# Patient Record
Sex: Female | Born: 1940 | Race: White | Hispanic: No | Marital: Married | Smoking: Never smoker
Health system: Southern US, Community
[De-identification: ages and names within clinical notes are randomized; demographics above are authoritative.]

## PROBLEM LIST (undated history)

## (undated) DIAGNOSIS — G2581 Restless legs syndrome: Secondary | ICD-10-CM

## (undated) DIAGNOSIS — I639 Cerebral infarction, unspecified: Secondary | ICD-10-CM

## (undated) DIAGNOSIS — K635 Polyp of colon: Secondary | ICD-10-CM

## (undated) DIAGNOSIS — R112 Nausea with vomiting, unspecified: Secondary | ICD-10-CM

## (undated) DIAGNOSIS — I671 Cerebral aneurysm, nonruptured: Secondary | ICD-10-CM

## (undated) DIAGNOSIS — H3530 Unspecified macular degeneration: Secondary | ICD-10-CM

## (undated) DIAGNOSIS — T7840XA Allergy, unspecified, initial encounter: Secondary | ICD-10-CM

## (undated) DIAGNOSIS — G5602 Carpal tunnel syndrome, left upper limb: Secondary | ICD-10-CM

## (undated) DIAGNOSIS — E7800 Pure hypercholesterolemia, unspecified: Secondary | ICD-10-CM

## (undated) DIAGNOSIS — Z9889 Other specified postprocedural states: Secondary | ICD-10-CM

## (undated) DIAGNOSIS — G4700 Insomnia, unspecified: Secondary | ICD-10-CM

## (undated) DIAGNOSIS — K5792 Diverticulitis of intestine, part unspecified, without perforation or abscess without bleeding: Secondary | ICD-10-CM

## (undated) DIAGNOSIS — M1990 Unspecified osteoarthritis, unspecified site: Secondary | ICD-10-CM

## (undated) DIAGNOSIS — K219 Gastro-esophageal reflux disease without esophagitis: Secondary | ICD-10-CM

## (undated) DIAGNOSIS — I10 Essential (primary) hypertension: Secondary | ICD-10-CM

## (undated) DIAGNOSIS — Z8489 Family history of other specified conditions: Secondary | ICD-10-CM

## (undated) DIAGNOSIS — I2510 Atherosclerotic heart disease of native coronary artery without angina pectoris: Secondary | ICD-10-CM

## (undated) HISTORY — DX: Insomnia, unspecified: G47.00

## (undated) HISTORY — DX: Allergy, unspecified, initial encounter: T78.40XA

## (undated) HISTORY — DX: Polyp of colon: K63.5

## (undated) HISTORY — PX: KNEE ARTHROSCOPY: SHX127

## (undated) HISTORY — DX: Carpal tunnel syndrome, left upper limb: G56.02

## (undated) HISTORY — DX: Unspecified osteoarthritis, unspecified site: M19.90

## (undated) HISTORY — DX: Diverticulitis of intestine, part unspecified, without perforation or abscess without bleeding: K57.92

## (undated) HISTORY — PX: BREAST SURGERY: SHX581

## (undated) HISTORY — DX: Gastro-esophageal reflux disease without esophagitis: K21.9

## (undated) HISTORY — PX: JOINT REPLACEMENT: SHX530

## (undated) HISTORY — PX: CHOLECYSTECTOMY: SHX55

## (undated) HISTORY — DX: Cerebral aneurysm, nonruptured: I67.1

## (undated) HISTORY — DX: Restless legs syndrome: G25.81

---

## 1998-04-11 ENCOUNTER — Emergency Department (HOSPITAL_COMMUNITY): Admission: EM | Admit: 1998-04-11 | Discharge: 1998-04-11 | Payer: Self-pay | Admitting: Emergency Medicine

## 1998-12-12 HISTORY — PX: VAGINAL HYSTERECTOMY: SUR661

## 1999-12-23 ENCOUNTER — Encounter: Admission: RE | Admit: 1999-12-23 | Discharge: 1999-12-23 | Payer: Self-pay | Admitting: Endocrinology

## 1999-12-23 ENCOUNTER — Encounter: Payer: Self-pay | Admitting: Endocrinology

## 2000-06-21 ENCOUNTER — Ambulatory Visit (HOSPITAL_COMMUNITY): Admission: RE | Admit: 2000-06-21 | Discharge: 2000-06-21 | Payer: Self-pay | Admitting: Gastroenterology

## 2000-12-26 ENCOUNTER — Encounter: Admission: RE | Admit: 2000-12-26 | Discharge: 2000-12-26 | Payer: Self-pay | Admitting: Endocrinology

## 2000-12-26 ENCOUNTER — Encounter: Payer: Self-pay | Admitting: Endocrinology

## 2001-08-03 ENCOUNTER — Encounter: Admission: RE | Admit: 2001-08-03 | Discharge: 2001-08-03 | Payer: Self-pay | Admitting: General Surgery

## 2001-08-03 ENCOUNTER — Encounter: Payer: Self-pay | Admitting: General Surgery

## 2002-09-03 ENCOUNTER — Encounter: Admission: RE | Admit: 2002-09-03 | Discharge: 2002-09-03 | Payer: Self-pay | Admitting: Endocrinology

## 2002-09-03 ENCOUNTER — Encounter: Payer: Self-pay | Admitting: Endocrinology

## 2002-11-05 ENCOUNTER — Encounter: Admission: RE | Admit: 2002-11-05 | Discharge: 2002-11-05 | Payer: Self-pay | Admitting: Internal Medicine

## 2002-11-05 ENCOUNTER — Encounter: Payer: Self-pay | Admitting: Internal Medicine

## 2003-03-04 ENCOUNTER — Ambulatory Visit (HOSPITAL_COMMUNITY): Admission: RE | Admit: 2003-03-04 | Discharge: 2003-03-04 | Payer: Self-pay | Admitting: Neurology

## 2003-03-20 ENCOUNTER — Ambulatory Visit (HOSPITAL_COMMUNITY): Admission: RE | Admit: 2003-03-20 | Discharge: 2003-03-20 | Payer: Self-pay | Admitting: Neurology

## 2003-04-08 ENCOUNTER — Ambulatory Visit (HOSPITAL_COMMUNITY): Admission: RE | Admit: 2003-04-08 | Discharge: 2003-04-08 | Payer: Self-pay | Admitting: Neurology

## 2003-04-19 ENCOUNTER — Encounter: Payer: Self-pay | Admitting: Neurology

## 2003-05-16 ENCOUNTER — Observation Stay (HOSPITAL_COMMUNITY): Admission: AD | Admit: 2003-05-16 | Discharge: 2003-05-17 | Payer: Self-pay | Admitting: Interventional Radiology

## 2003-10-29 ENCOUNTER — Encounter: Admission: RE | Admit: 2003-10-29 | Discharge: 2003-10-29 | Payer: Self-pay | Admitting: Internal Medicine

## 2003-11-18 ENCOUNTER — Ambulatory Visit (HOSPITAL_COMMUNITY): Admission: RE | Admit: 2003-11-18 | Discharge: 2003-11-18 | Payer: Self-pay | Admitting: Interventional Radiology

## 2003-11-25 ENCOUNTER — Encounter: Admission: RE | Admit: 2003-11-25 | Discharge: 2003-11-25 | Payer: Self-pay | Admitting: Obstetrics and Gynecology

## 2003-12-02 ENCOUNTER — Ambulatory Visit (HOSPITAL_COMMUNITY): Admission: RE | Admit: 2003-12-02 | Discharge: 2003-12-02 | Payer: Self-pay | Admitting: Interventional Radiology

## 2004-11-02 ENCOUNTER — Encounter: Admission: RE | Admit: 2004-11-02 | Discharge: 2004-11-02 | Payer: Self-pay | Admitting: Obstetrics and Gynecology

## 2005-04-12 DIAGNOSIS — K635 Polyp of colon: Secondary | ICD-10-CM

## 2005-04-12 HISTORY — DX: Polyp of colon: K63.5

## 2005-11-08 ENCOUNTER — Encounter: Admission: RE | Admit: 2005-11-08 | Discharge: 2005-11-08 | Payer: Self-pay | Admitting: Obstetrics and Gynecology

## 2006-01-03 ENCOUNTER — Encounter: Payer: Self-pay | Admitting: Interventional Radiology

## 2006-01-24 ENCOUNTER — Encounter: Admission: RE | Admit: 2006-01-24 | Discharge: 2006-01-24 | Payer: Self-pay | Admitting: Endocrinology

## 2006-02-08 ENCOUNTER — Ambulatory Visit (HOSPITAL_COMMUNITY): Admission: RE | Admit: 2006-02-08 | Discharge: 2006-02-08 | Payer: Self-pay | Admitting: Interventional Radiology

## 2006-11-14 ENCOUNTER — Encounter: Admission: RE | Admit: 2006-11-14 | Discharge: 2006-11-14 | Payer: Self-pay | Admitting: Endocrinology

## 2007-03-10 ENCOUNTER — Ambulatory Visit (HOSPITAL_COMMUNITY): Admission: RE | Admit: 2007-03-10 | Discharge: 2007-03-10 | Payer: Self-pay | Admitting: Interventional Radiology

## 2007-11-27 ENCOUNTER — Encounter: Admission: RE | Admit: 2007-11-27 | Discharge: 2007-11-27 | Payer: Self-pay | Admitting: Endocrinology

## 2007-12-05 ENCOUNTER — Encounter: Admission: RE | Admit: 2007-12-05 | Discharge: 2007-12-05 | Payer: Self-pay | Admitting: Endocrinology

## 2008-01-18 ENCOUNTER — Encounter: Payer: Self-pay | Admitting: Interventional Radiology

## 2008-03-11 ENCOUNTER — Ambulatory Visit (HOSPITAL_COMMUNITY): Admission: RE | Admit: 2008-03-11 | Discharge: 2008-03-11 | Payer: Self-pay | Admitting: Interventional Radiology

## 2008-11-27 ENCOUNTER — Encounter: Admission: RE | Admit: 2008-11-27 | Discharge: 2008-11-27 | Payer: Self-pay | Admitting: Endocrinology

## 2009-12-01 ENCOUNTER — Encounter: Admission: RE | Admit: 2009-12-01 | Discharge: 2009-12-01 | Payer: Self-pay | Admitting: Endocrinology

## 2010-01-13 ENCOUNTER — Encounter (INDEPENDENT_AMBULATORY_CARE_PROVIDER_SITE_OTHER): Payer: Self-pay | Admitting: Obstetrics and Gynecology

## 2010-01-13 ENCOUNTER — Inpatient Hospital Stay (HOSPITAL_COMMUNITY): Admission: RE | Admit: 2010-01-13 | Discharge: 2010-01-15 | Payer: Self-pay | Admitting: Obstetrics and Gynecology

## 2010-05-02 ENCOUNTER — Encounter: Payer: Self-pay | Admitting: Neurology

## 2010-05-03 ENCOUNTER — Encounter: Payer: Self-pay | Admitting: Endocrinology

## 2010-06-25 LAB — APTT: aPTT: 29 seconds (ref 24–37)

## 2010-06-25 LAB — BASIC METABOLIC PANEL
BUN: 6 mg/dL (ref 6–23)
BUN: 9 mg/dL (ref 6–23)
CO2: 22 mEq/L (ref 19–32)
CO2: 28 mEq/L (ref 19–32)
Calcium: 8.4 mg/dL (ref 8.4–10.5)
Chloride: 109 mEq/L (ref 96–112)
Creatinine, Ser: 0.77 mg/dL (ref 0.4–1.2)
GFR calc Af Amer: >60 mL/min (ref >60)
GFR calc non Af Amer: >60 mL/min (ref >60)
Glucose, Bld: 112 mg/dL — ABNORMAL HIGH (ref 70–99)
Glucose, Bld: 125 mg/dL — ABNORMAL HIGH (ref 70–99)
Sodium: 139 mEq/L (ref 135–145)

## 2010-06-25 LAB — COMPREHENSIVE METABOLIC PANEL
ALT: 16 U/L (ref 0–35)
Albumin: 4.1 g/dL (ref 3.5–5.2)
Chloride: 108 mEq/L (ref 96–112)
Potassium: 3.8 mEq/L (ref 3.5–5.1)

## 2010-06-25 LAB — CBC
MCH: 30.8 pg (ref 26.0–34.0)
MCH: 31 pg (ref 26.0–34.0)
MCHC: 34.2 g/dL (ref 30.0–36.0)
Platelets: 222 10*3/uL (ref 150–400)
RBC: 3.41 MIL/uL — ABNORMAL LOW (ref 3.87–5.11)
RBC: 4.28 MIL/uL (ref 3.87–5.11)
RDW: 12.4 % (ref 11.5–15.5)
WBC: 6.8 10*3/uL (ref 4.0–10.5)
WBC: 5.9 10*3/uL (ref 4.0–10.5)

## 2010-06-25 LAB — PROTIME-INR
INR: 0.95 (ref 0.00–1.49)
Prothrombin Time: 12.9 seconds (ref 11.6–15.2)

## 2010-06-25 LAB — TYPE AND SCREEN: ABO/RH(D): A NEG

## 2010-08-25 NOTE — Consult Note (Signed)
[name], [name]                  ACCOUNT NO.:  [id number]   MEDICAL RECORD NO.:  [id number]          PATIENT TYPE:  OUT   LOCATION:  XRAY                         FACILITY:  [facility]   PHYSICIAN:  Sanjeev K. Deveshwar, M.D.DATE OF BIRTH:  [birth year]   DATE OF CONSULTATION:  01/18/2008  DATE OF DISCHARGE:                                 CONSULTATION   CHIEF COMPLAINT:  Cerebral aneurysm.   HISTORY OF PRESENT ILLNESS:  This is a pleasant 70 year old female who  was referred to Dr. [name] in the past by Dr. 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  I believe she first had this  aneurysm documented in 2004.  The aneurysm is in the left pericallosal  area.  A coiling was attempted in February 2005, however, this was  unsuccessful.  The patient later returned in October 2007 for another  attempt at the endovascular treatment of the aneurysm.  This aneurysm  did have a wide neck and measured 3 mm x 3.5 mm.  Dr. [name] felt  that the aneurysm would require stent placement due to the wide neck,  however, given the distal location of the aneurysm, it was felt that  there was a little chance of delivery of the stent.  After discussions  with the patient and her family, conservative management was  recommended.  The patient's most recent cerebral angiogram was performed  on March 10, 2007.  At that time, the aneurysm again was felt to be  stable and no intervention was recommended.  A followup CT angiogram or  MRI/MRA was recommended in 1 year.   The patient was recently seen by her GYN physician, Dr. [name].  Apparently, the patient is in need of surgery for a pelvic prolapse.  Dr. [name] has requested that the patient be evaluated by Dr. [name]  for her cerebral aneurysm prior to proceeding with surgery.  According  to the patient, the surgery has been planned for December 2009.  The  patient presents today for further discussions.   PAST MEDICAL HISTORY:   Significant for the above-noted aneurysm and she  also has a history of hypertension, a history of precancerous colonic  polyp which was resected, history of hyperlipidemia, and history of a  benign breast mass.   SURGICAL HISTORY:  The patient had bilateral knee arthroscopy.  She has  had a cholecystectomy.  She had a benign breast mass removed.  She has  had nausea and vomiting in the past associated with general anesthesia.   ALLERGIES:  The patient is intolerant to CODEINE.   MEDICATIONS:  Antara for hyperlipidemia which apparently there are plans  to stop this medication.  She is also on ramipril for her blood  pressure, vitamin D, and p.r.n. Tylenol.   SOCIAL HISTORY:  The patient is married.  She has one son.  The patient  lives in [place] with her husband.  She does not smoke or use alcohol.  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   FAMILY HISTORY:  Her mother died at age 15 from natural causes.  Her  father  died at age 65 from lung cancer.  She has 2 brothers and 1  sister.   IMPRESSION AND PLAN:  As noted, the patient was referred to Dr.  [name] by Dr. [name] for evaluation of her aneurysm prior to  planned surgery for pelvic prolapse.  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  Attempts at coiling this  aneurysm in the past have been unsuccessful.  She is being monitored  with conservative therapy.  Her last angiogram was performed on March 10, 2007.  At that time, the aneurysm appeared to be stable and Dr.  [name] recommended a followup CT angiogram or an MRI/MRA in 1 year.   Dr. [name] felt that the patient should have the CT angiogram  performed prior to proceeding with surgery.  Again, the patient believes  the surgery will probably be performed in December.  We have made  arrangements for her have a CT angiogram on March 11, 2008.  If her  aneurysm appears stable at that time, a repeat cerebral angiogram will  be  recommended in 2 years.  Dr. [name] did not feel that the cerebral  aneurysm represented any increased risk for her planned pelvic prolapse  surgery, although once again he wanted to confirm stability of the  aneurysm by performing a CT angiogram on March 11, 2008.   Greater than 25 minutes were spent on this consult.      [name], P.A.    ______________________________  [name]. [name], M.D.    DR/MEDQ  D:  01/18/2008  T:  01/19/2008  Job:  [id number]   cc:   Reather Littler, M.D.  Randye Lobo, M.D.

## 2010-08-28 NOTE — Procedures (Signed)
[place]  Patient:    Aimee Hood, Aimee Hood                         MRN: [id number] Proc. Date: 06/21/00 Adm. Date:  [id number] Attending:  Charna Elizabeth CC:         Reather Littler, M.D.                           Procedure Report  DATE OF BIRTH:  [birth year]  REFERRING PHYSICIAN:  Reather Littler, M.D.  PROCEDURE PERFORMED:  Colonoscopy.  ENDOSCOPIST:  Anselmo Rod, M.D.  INSTRUMENT USED:  Olympus video colonoscope.  INDICATIONS FOR PROCEDURE:  Guaiac positive stool and a family history of colon cancer in a 70 year old white female.  Rule out colonic polyps, masses, hemorrhoids, etc.  PREPROCEDURE PREPARATION:  Informed consent was procured from the patient. The patient was fasted for eight hours prior to the procedure and prepped with a bottle of magnesium citrate and a gallon of NuLytely the night prior to the procedure.  PREPROCEDURE PHYSICAL:  The patient had stable vital signs.  Neck supple. Chest clear to auscultation.  S1, S2 regular.  Abdomen soft with normal abdominal bowel sounds.  DESCRIPTION OF PROCEDURE:  The patient was placed in the left lateral decubitus position and sedated with 75 mg of Demerol and 7 mg of Versed intravenously.  Once the patient was adequately sedated and maintained on low-flow oxygen and continuous cardiac monitoring, the Olympus video colonoscope was advanced from the rectum to the cecum with difficulty secondary to a large amount of solid stool in the colon.  The procedure was completed up to the cecum.  The appendicular orifice and the ileocecal valve were clearly visualized and photographed but the patients position had to be changed from the left lateral to the supine and then the right lateral position to adequately visualize the colonic mucosa.  A very small lesion may have been missed.  The patient had left-sided diverticula and small internal hemorrhoids seen on retroflexion.  IMPRESSION: 1. Large  amount of residual stool in the colon. 2. Left-sided diverticulosis. 3. Small nonbleeding internal hemorrhoids seen on retroflexion.  RECOMMENDATIONS: 1. Repeat guaiac testing will be done on an outpatient basis and further    recommendations made as needed. 2. A high fiber diet and liberal fluid intake has been advocated. 3. Outpatient follow-up is advised in the next two weeks.DD:  06/21/00 TD:  06/21/00 Job: [id number]

## 2010-08-28 NOTE — H&P (Signed)
NAMESANJUANITA, CONDREY                  ACCOUNT NO.:  [id number]   MEDICAL RECORD NO.:  [id number]          PATIENT TYPE:  AMB   LOCATION:  SDS                          FACILITY:  [facility]   PHYSICIAN:  Sanjeev K. Deveshwar, M.D.DATE OF BIRTH:  05-14-1940   DATE OF ADMISSION:  02/08/2006  DATE OF DISCHARGE:                                HISTORY & PHYSICAL   CHIEF COMPLAINT:  Cerebral aneurysm.   HISTORY OF PRESENT ILLNESS:  This is a pleasant 70 year old female with a  history of a left pericallosal cerebral aneurysm.  Attempt was made at  coiling this aneurysm in February 2005.  However, it was unable to be  accomplished due to the limits of technology at that time.  Dr. [name]  felt that a specific stent was needed which was not yet available.  A  decision was made to await FDA approval of a Microstent.  This stent has now  been approved, and the patient returns today for a repeat cerebral aneurysm  and possible stenting and/or coiling of her aneurysm.   The patient denies any recent symptoms.  She had had some dizzy spells in  the past.  However, these have since resolved.   PAST MEDICAL HISTORY:  1. The patient's primary care physician is Dr. Reather Littler.  2. Her neurologist is Dr. [name].  3. She does have a history of hypertension.  4. She has a history of a precancerous colonic polyp that was resected.  5. Otherwise, she has been fairly healthy.   SURGICAL HISTORY:  1. Bilateral knee arthroscopies.  2. She has had a cholecystectomy.  3. She has had a benign breast mass removed.   ALLERGIES:  CODEINE.   CURRENT MEDICATIONS:  Aspirin, Plavix, Altace, Antara, vitamin D, Zantac,  clonazepam, and diphenhydramine which she takes on a p.r.n. basis.  The  Plavix was started recently in anticipation of possible stent placement.  She also received nimodipine today prior to the intervention.   SOCIAL HISTORY:  The patient is married.  She has a son, age 4.  The  patient lives in [place] with her husband.  She does not smoke or use  alcohol.  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   FAMILY HISTORY:  Her mother died at age 44 from natural causes.  Her father  died at age 68 from lung cancer.  She has two brothers and one sister.   REVIEW OF SYSTEMS:  Completely negative, except for an occasional nosebleed  which she attributes to sinus problems.   LABORATORY DATA:  INR is 1, PTT is 31.  CBC reveals hemoglobin 12.6,  hematocrit 37.1, WBC 6000, platelets 301,000.  BUN 7, creatinine 0.7,  potassium 3.7.  Glucose is mildly elevated at 118.   PHYSICAL EXAMINATION:  GENERAL:  Pleasant 70 year old female in no acute  distress.  VITAL SIGNS:  Blood pressure 141/82, pulse 64, respirations 20, temperature  97.3, oxygen saturation 97% on room air.  HEENT:  Unremarkable.  NECK:  Reveals no bruits, no jugular venous distention.  HEART:  Regular rate and rhythm, without murmur.  LUNGS:  Clear.  ABDOMEN:  Obese, soft, nontender.  EXTREMITIES:  Reveal pulses to be intact.  There is trace edema.  SKIN:  Warm and dry.  NEUROLOGIC:  Her airway is rated at a 3.  Her ASA scale is a 2.  Mental  status:  The patient is alert and oriented and follows commands.  Cranial  nerves II-XII are grossly intact.  Sensation is intact to light touch.  Motor strength is 5/5 throughout.  Cerebellar testing is intact.   IMPRESSION:  1. History of a left pericallosal cerebral aneurysm, with previous coiling      attempt in February 2005 which was unsuccessful due to the limitations      of the stents available at that time.  2. History of dizziness, which has since resolved.  3. History of hypertension.  4. History of a precancerous colonic polyp which was resected in August      2007.  5. History of multiple surgeries.  6. CODEINE allergy.  7. Mildly elevated glucose.   PLAN:  As noted, the patient will undergo a repeat cerebral angiogram today,  with possible stenting and/or  coiling of the aneurysm.  Plavix was started  three days ago in anticipation of a possible stent placement.      [name], P.A.    ______________________________  [name]. [name], M.D.    DR/MEDQ  D:  02/08/2006  T:  02/08/2006  Job:  [id number]   cc:   Reather Littler, M.D.  Catherine A. Orlin Hilding, M.D.

## 2010-09-15 ENCOUNTER — Other Ambulatory Visit (HOSPITAL_COMMUNITY): Payer: Self-pay | Admitting: Interventional Radiology

## 2010-09-15 ENCOUNTER — Other Ambulatory Visit: Payer: Self-pay | Admitting: Endocrinology

## 2010-09-15 DIAGNOSIS — Z139 Encounter for screening, unspecified: Secondary | ICD-10-CM

## 2010-09-15 DIAGNOSIS — I729 Aneurysm of unspecified site: Secondary | ICD-10-CM

## 2010-09-21 ENCOUNTER — Ambulatory Visit (HOSPITAL_COMMUNITY)
Admission: RE | Admit: 2010-09-21 | Discharge: 2010-09-21 | Disposition: A | Discharge status: Home / Self Care | Payer: BC Managed Care – PPO | Source: Ambulatory Visit | Attending: Interventional Radiology | Admitting: Interventional Radiology

## 2010-09-21 DIAGNOSIS — I671 Cerebral aneurysm, nonruptured: Secondary | ICD-10-CM | POA: Insufficient documentation

## 2010-09-21 DIAGNOSIS — I729 Aneurysm of unspecified site: Secondary | ICD-10-CM

## 2010-09-21 DIAGNOSIS — R51 Headache: Secondary | ICD-10-CM | POA: Insufficient documentation

## 2010-09-21 LAB — CREATININE, SERUM
Creatinine, Ser: 0.97 mg/dL (ref 0.4–1.2)
GFR calc Af Amer: >60 mL/min (ref >60)
GFR calc non Af Amer: 57 mL/min — ABNORMAL LOW (ref >60)

## 2010-09-21 MED ORDER — IOHEXOL 350 MG/ML SOLN
75.0000 mL | Freq: Once | INTRAVENOUS | Status: AC | PRN
  Administered 2010-09-21: 75 mL via INTRAVENOUS

## 2010-12-07 ENCOUNTER — Ambulatory Visit
Admission: RE | Admit: 2010-12-07 | Discharge: 2010-12-07 | Disposition: A | Discharge status: Home / Self Care | Payer: BC Managed Care – PPO | Source: Ambulatory Visit | Attending: Endocrinology | Admitting: Endocrinology

## 2010-12-07 DIAGNOSIS — Z139 Encounter for screening, unspecified: Secondary | ICD-10-CM

## 2011-01-19 LAB — BASIC METABOLIC PANEL
BUN: 14
CO2: 28
Chloride: 107
Glucose, Bld: 93
Potassium: 4.1
Sodium: 143

## 2011-01-19 LAB — CBC
HCT: 38
Hemoglobin: 12.8
MCHC: 33.8
MCV: 88.3
Platelets: 284
RDW: 13.3

## 2011-07-27 ENCOUNTER — Other Ambulatory Visit (HOSPITAL_COMMUNITY): Payer: Self-pay | Admitting: Interventional Radiology

## 2011-07-27 DIAGNOSIS — I729 Aneurysm of unspecified site: Secondary | ICD-10-CM

## 2011-07-28 ENCOUNTER — Ambulatory Visit (HOSPITAL_COMMUNITY)
Admission: RE | Admit: 2011-07-28 | Discharge: 2011-07-28 | Disposition: A | Discharge status: Home / Self Care | Payer: BC Managed Care – PPO | Source: Ambulatory Visit | Attending: Interventional Radiology | Admitting: Interventional Radiology

## 2011-07-28 ENCOUNTER — Telehealth (HOSPITAL_COMMUNITY): Payer: Self-pay

## 2011-07-28 DIAGNOSIS — S0990XA Unspecified injury of head, initial encounter: Secondary | ICD-10-CM | POA: Insufficient documentation

## 2011-07-28 DIAGNOSIS — I729 Aneurysm of unspecified site: Secondary | ICD-10-CM

## 2011-07-28 DIAGNOSIS — R51 Headache: Secondary | ICD-10-CM | POA: Insufficient documentation

## 2011-07-28 DIAGNOSIS — I671 Cerebral aneurysm, nonruptured: Secondary | ICD-10-CM | POA: Insufficient documentation

## 2011-07-28 DIAGNOSIS — W19XXXA Unspecified fall, initial encounter: Secondary | ICD-10-CM | POA: Insufficient documentation

## 2011-07-28 LAB — BUN: BUN: 12 mg/dL (ref 6–23)

## 2011-07-28 LAB — CREATININE, SERUM: GFR calc Af Amer: >90 mL/min (ref >90)

## 2011-07-28 MED ORDER — IOHEXOL 350 MG/ML SOLN
50.0000 mL | Freq: Once | INTRAVENOUS | Status: AC | PRN
  Administered 2011-07-28: 50 mL via INTRAVENOUS

## 2011-07-28 NOTE — Telephone Encounter (Signed)
Pt called in to ask Dr. [name] if it was ok for her to take Avelox.  He stated that it was an antibiotic in which he did not prescribe.  He stated that her CT angio showed that her aneurysm is stable.

## 2011-11-03 ENCOUNTER — Other Ambulatory Visit: Payer: Self-pay | Admitting: Endocrinology

## 2011-11-03 DIAGNOSIS — Z1231 Encounter for screening mammogram for malignant neoplasm of breast: Secondary | ICD-10-CM

## 2011-11-17 ENCOUNTER — Other Ambulatory Visit: Payer: Self-pay | Admitting: Endocrinology

## 2011-11-17 DIAGNOSIS — Z8739 Personal history of other diseases of the musculoskeletal system and connective tissue: Secondary | ICD-10-CM

## 2011-12-06 ENCOUNTER — Ambulatory Visit
Admission: RE | Admit: 2011-12-06 | Discharge: 2011-12-06 | Disposition: A | Discharge status: Home / Self Care | Payer: BC Managed Care – PPO | Source: Ambulatory Visit | Attending: Endocrinology | Admitting: Endocrinology

## 2011-12-06 DIAGNOSIS — Z8739 Personal history of other diseases of the musculoskeletal system and connective tissue: Secondary | ICD-10-CM

## 2011-12-06 DIAGNOSIS — Z1231 Encounter for screening mammogram for malignant neoplasm of breast: Secondary | ICD-10-CM

## 2011-12-08 ENCOUNTER — Ambulatory Visit: Payer: BC Managed Care – PPO

## 2011-12-08 ENCOUNTER — Other Ambulatory Visit: Payer: BC Managed Care – PPO

## 2012-10-30 ENCOUNTER — Other Ambulatory Visit: Payer: Self-pay

## 2012-10-30 DIAGNOSIS — Z1231 Encounter for screening mammogram for malignant neoplasm of breast: Secondary | ICD-10-CM

## 2012-11-05 ENCOUNTER — Other Ambulatory Visit: Payer: Self-pay | Admitting: Endocrinology

## 2012-11-05 DIAGNOSIS — I10 Essential (primary) hypertension: Secondary | ICD-10-CM

## 2012-11-05 DIAGNOSIS — E559 Vitamin D deficiency, unspecified: Secondary | ICD-10-CM

## 2012-11-05 DIAGNOSIS — E785 Hyperlipidemia, unspecified: Secondary | ICD-10-CM

## 2012-11-06 ENCOUNTER — Other Ambulatory Visit (INDEPENDENT_AMBULATORY_CARE_PROVIDER_SITE_OTHER): Payer: BC Managed Care – PPO

## 2012-11-06 DIAGNOSIS — I10 Essential (primary) hypertension: Secondary | ICD-10-CM

## 2012-11-06 DIAGNOSIS — E785 Hyperlipidemia, unspecified: Secondary | ICD-10-CM

## 2012-11-06 DIAGNOSIS — E559 Vitamin D deficiency, unspecified: Secondary | ICD-10-CM

## 2012-11-06 LAB — COMPREHENSIVE METABOLIC PANEL
ALT: 23 U/L (ref 0–35)
Alkaline Phosphatase: 57 U/L (ref 39–117)
CO2: 28 mEq/L (ref 19–32)
Potassium: 4.7 mEq/L (ref 3.5–5.1)
Sodium: 141 mEq/L (ref 135–145)
Total Bilirubin: 0.7 mg/dL (ref 0.3–1.2)
Total Protein: 7.5 g/dL (ref 6.0–8.3)

## 2012-11-06 LAB — URINALYSIS, ROUTINE W REFLEX MICROSCOPIC
Bilirubin Urine: NEGATIVE
Nitrite: NEGATIVE
Specific Gravity, Urine: 1.01 (ref 1.000–1.030)
pH: 7 (ref 5.0–8.0)

## 2012-11-06 LAB — LIPID PANEL
HDL: 46.5 mg/dL (ref >39.00)
Total CHOL/HDL Ratio: 3

## 2012-11-13 ENCOUNTER — Ambulatory Visit (INDEPENDENT_AMBULATORY_CARE_PROVIDER_SITE_OTHER): Payer: BC Managed Care – PPO | Admitting: Endocrinology

## 2012-11-13 ENCOUNTER — Other Ambulatory Visit: Payer: Self-pay | Admitting: *Deleted

## 2012-11-13 ENCOUNTER — Encounter: Payer: Self-pay | Admitting: Endocrinology

## 2012-11-13 VITALS — BP 112/74 | HR 62 | Temp 97.9°F | Resp 12 | Ht 66.5 in | Wt 220.1 lb

## 2012-11-13 DIAGNOSIS — E785 Hyperlipidemia, unspecified: Secondary | ICD-10-CM

## 2012-11-13 DIAGNOSIS — Z0000 Encounter for general adult medical examination without abnormal findings: Secondary | ICD-10-CM

## 2012-11-13 DIAGNOSIS — M1990 Unspecified osteoarthritis, unspecified site: Secondary | ICD-10-CM

## 2012-11-13 DIAGNOSIS — I10 Essential (primary) hypertension: Secondary | ICD-10-CM

## 2012-11-13 DIAGNOSIS — G2581 Restless legs syndrome: Secondary | ICD-10-CM

## 2012-11-13 DIAGNOSIS — K219 Gastro-esophageal reflux disease without esophagitis: Secondary | ICD-10-CM

## 2012-11-13 MED ORDER — RAMIPRIL 10 MG PO TABS: 5.0000 mg | ORAL_TABLET | Freq: Every day | ORAL | Status: DC

## 2012-11-13 NOTE — Progress Notes (Signed)
Patient ID: Tor Netters, female   DOB: 07/21/1940, 72 y.o.    History of Present Illness:  Hypertension:   Has been on treatment with Ramipril for hypertension since about 2003. Blood pressure has usually been very well controlled. No headaches.  Occasionally it may be higher if she is under more stress with taking care of a family member.  Home blood pressure checks: has been checking less regularly now and readings are 120/70,  she has no lightheadedness or headaches  Hyperlipidemia:  Lipid abnormalities Have been high triglycerides, highest level of 426 in 2007 and also high LDL, up to 156. With treatment her triglycerides have been as low as 57 and LDL down to 31.  When fenofibrate was stopped her triglycerides started increasing along with LDL and this was restarted. She has done well with her diet  but has not been exercising Her triglycerides  are  excellent now with low HDL of 46 .   Leg pain :  She complains of left outer leg pain, more when getting up after sitting for a while or on lying down, no tingling or numbness in the leg or weakness. Has had this on and off for a couple of years.She had seen a Orthopedic surgeon who had given 2 steroid injections, reportedly in her hips and the last one helped . She she may take Aleve at times for relief     Past Medical History  Diagnosis Date  . Arthritis   . Diverticulitis   . Carpal tunnel syndrome of left wrist   . Colon polyp 2007  . Allergy   . Cerebral aneurysm, nonruptured     2-3 mm in size, stable    Past Surgical History  Procedure Laterality Date  . Cholecystectomy    . Breast surgery      Family History  Problem Relation Age of Onset  . Diabetes Brother   . Heart disease Brother   . Diabetes Paternal Aunt   . Cancer Maternal Grandmother     Social History:  reports that she has never smoked. She does not have any smokeless tobacco history on file. Her alcohol and drug histories are not on file.  Allergies:   Allergies  Allergen Reactions  . Codeine       Medication List       This list is accurate as of: 11/13/12 11:59 PM.  Always use your most recent med list.               cholecalciferol 400 UNITS Tabs tablet  Commonly known as:  VITAMIN D  Take 1,000 Units by mouth daily. 2 each day     fenofibrate micronized 134 MG capsule  Commonly known as:  LOFIBRA  Take 134 mg by mouth daily before breakfast.     methylcellulose oral powder  Take by mouth daily.     naproxen sodium 220 MG tablet  Commonly known as:  ANAPROX  Take 220 mg by mouth 2 (two) times daily with a meal.     omeprazole 20 MG capsule  Commonly known as:  PRILOSEC  Take 20 mg by mouth daily.     polycarbophil 625 MG tablet  Commonly known as:  FIBERCON  Take 625 mg by mouth daily. Takes two each day     ramipril 10 MG tablet  Commonly known as:  ALTACE  Take 0.5 tablets (5 mg total) by mouth daily.        No visits with results within  1 Week(s) from this visit. Latest known visit with results is:  Appointment on 11/06/2012  Component Date Value Range Status  . Color, Urine 11/06/2012 LT. YELLOW  Yellow;Lt. Yellow Final  . APPearance 11/06/2012 CLEAR  Clear Final  . Specific Gravity, Urine 11/06/2012 1.010  1.000-1.030 Final  . pH 11/06/2012 7.0  5.0 - 8.0 Final  . Total Protein, Urine 11/06/2012 NEGATIVE  Negative Final  . Urine Glucose 11/06/2012 100  Negative Final  . Ketones, ur 11/06/2012 NEGATIVE  Negative Final  . Bilirubin Urine 11/06/2012 NEGATIVE  Negative Final  . Hgb urine dipstick 11/06/2012 NEGATIVE  Negative Final  . Urobilinogen, UA 11/06/2012 0.2  0.0 - 1.0 Final  . Leukocytes, UA 11/06/2012 MODERATE  Negative Final  . Nitrite 11/06/2012 NEGATIVE  Negative Final  . WBC, UA 11/06/2012 0-2/hpf  0-2/hpf Final  . RBC / HPF 11/06/2012 none seen  0-2/hpf Final  . Squamous Epithelial / LPF 11/06/2012 Few(5-10/hpf)  Rare(0-4/hpf) Final  . Vit D, 25-Hydroxy 11/06/2012 74  30 - 89 ng/mL  Final   Comment: This assay accurately quantifies Vitamin D, which is the sum of the                          25-Hydroxy forms of Vitamin D2 and D3.  Studies have shown that the                          optimum concentration of 25-Hydroxy Vitamin D is 30 ng/mL or higher.                           Concentrations of Vitamin D between 20 and 29 ng/mL are considered to                          be insufficient and concentrations less than 20 ng/mL are considered                          to be deficient for Vitamin D.  . Cholesterol 11/06/2012 135  0 - 200 mg/dL Final   ATP III Classification       Desirable:  < 200 mg/dL               Borderline High:  200 - 239 mg/dL          High:  > = 161 mg/dL  . Triglycerides 11/06/2012 124.0  0.0 - 149.0 mg/dL Final   Normal:  <096 mg/dLBorderline High:  150 - 199 mg/dL  . HDL 11/06/2012 46.50  >39.00 mg/dL Final  . VLDL 04/54/0981 24.8  0.0 - 40.0 mg/dL Final  . LDL Cholesterol 11/06/2012 64  0 - 99 mg/dL Final  . Total CHOL/HDL Ratio 11/06/2012 3   Final                  Men          Women1/2 Average Risk     3.4          3.3Average Risk          5.0          4.42X Average Risk          9.6          7.13X Average Risk  15.0          11.0                      . Sodium 11/06/2012 141  135 - 145 mEq/L Final  . Potassium 11/06/2012 4.7  3.5 - 5.1 mEq/L Final  . Chloride 11/06/2012 106  96 - 112 mEq/L Final  . CO2 11/06/2012 28  19 - 32 mEq/L Final  . Glucose, Bld 11/06/2012 87  70 - 99 mg/dL Final  . BUN 40/98/1191 25* 6 - 23 mg/dL Final  . Creatinine, Ser 11/06/2012 1.2  0.4 - 1.2 mg/dL Final  . Total Bilirubin 11/06/2012 0.7  0.3 - 1.2 mg/dL Final  . Alkaline Phosphatase 11/06/2012 57  39 - 117 U/L Final  . AST 11/06/2012 24  0 - 37 U/L Final  . ALT 11/06/2012 23  0 - 35 U/L Final  . Total Protein 11/06/2012 7.5  6.0 - 8.3 g/dL Final  . Albumin 47/82/9562 4.3  3.5 - 5.2 g/dL Final  . Calcium 13/11/6576 9.8  8.4 - 10.5 mg/dL Final  . GFR  46/96/2952 49.07* >60.00 mL/min Final     REVIEW OF SYSTEMS:         CONSTITUTIONAL:  no Body aches. Fatigue yes, mild .  HEENT:  no headaches,  CARDIOLOGY:  no Chest tightness. Edema yes, Occasionally, mild in the ankles, less recently. no Known coronary artery disease.  RESPIRATORY:  no Shortness of breath.  GASTROENTEROLOGY:  Acid reflux yes, she is doing very well with Prilosec as needed. symptoms had been mostly after meals. no Change in bowel habits, ..  UROLOGY:  Patient denies difficulty urinating, frequent urination. no Recurrent Urinary Tract Infection (UTI), Has had occasional urinary tract infections previously.  MUSCULOSKELETAL:  Arthritis yes, Has had knee joint pains left, less now . Leg cramps yes, Controlled with magnesium. Shooting leg pain left leg From upper thigh down to about the knee area.  OSTEOPENIA: Her last T score was -1.1 at the lumbar spine and normal at the hip, improved since 2005  NEUROLOGY: She has  had a small cerebral artery aneurysm and had MRI in 2013 which was stable. Will have another MRI in one year  Headache: None.  Insomnia yes, Mild at times. Also had restless legs and does take Klonopin  now.. no Tingling/numbness.  ENDOCRINOLOGY:  High cholesterol She has had relatively high triglycerides and also at times high LDL but this is very well controlled usually with fenofibrate despite her weight gain.    No  depression or anxiety.      PHYSICAL EXAM:  BP 112/74  Pulse 62  Temp(Src) 97.9 F (36.6 C)  Resp 12  Ht 5' 6.5" (1.689 m)  Wt 220 lb 1.6 oz (99.837 kg)  BMI 35 kg/m2  SpO2 98%  GENERAL: Mild generalized obesity present  No pallor, clubbing, lymphadenopathy or edema.  Skin:  no rash or pigmentation.  EYES:  Externally normal.  Fundii:  normal discs and vessels.  ENT: Oral mucosa pharynx and tongue normal.  THYROID:  Not palpable. NECK exam: No lymphadenopathy  CAROTIDS:  Normal character; no bruit.  HEART:  Normal apex,  S1 and S2; no murmur or click.  CHEST:  Normal shape and expansion.  Lungs: Vescicular breath sounds heard equally.  No crepitations/ wheeze.  BREAST exam: No mass palpable  ABDOMEN:  No distention.  Liver and spleen not palpable.  No other mass or tenderness.  RECTAL/PELVIC exam: Not indicated  NEUROLOGICAL: Reflexes are absent bilaterally at ankles and normal at biceps.  SPINE AND JOINTS:  She has mild diffuse swelling of the left knee joint with mild warmth compared to the right. No tenderness of the spine and has normal shape   ASSESSMENT/PLAN :   HYPERTENSION: Blood pressure excellent  and she also monitors at home. Has been on long- term treatment with ramipril 10 mg Because of her creatinine being high normal will reduce her ramipril to 5 mg   HYPERLIPIDEMIA: Her triglycerides are excellent now despite her difficulty with losing weight. Since HDL is also better will continue fenofibrate. LDL is normal also  MILD OSTEOPENIA. Her last T score was -  1.1 . Vitamin D level is normal although relatively higher and she can reduce the dose to 1000 units daily now. Needs to continue on calcium  Daily  SMALL stable cerebral artery aneurysm: Has been followed regularly with MRI angiograms  REFLUX: Well-controlled with Prilosec as needed   OBESITY: She has done very well in the past with diet and exercise but has gained weight because of lack of exercise now   RESTLESS leg syndrome: She has mild symptoms, relieved by Klonopin.  LEFT-sided leg pain: Since she had relief with injection of the left femoral trochanter this is likely to be bursitis rather than radiculopathy  She is going to followup on this with the orthopedic surgeon.  Osteoarthritis left knee: Still somewhat problematic and she has signs of inflammation     Preventive Medicine    Diet: Continue a low fat, low cholesterol diet.  Exercise: recommended 30-45 minutes of aerobic exercise at least 3 times a week.   Dentist and Eye doctor exams: recommended these be done routinely.  Breast self examination: recommended this be done monthly.  Calcium: recommended taking 1000 mg a day  Vitamin D: recommended continuing 1000 units daily  Seat Belts: reminded to always use lap and shoulder restraints.    Health Promotion:  Aspirin 81mg  QD is not recommended.  Breast self exam every month is recommended..   Immunizations:  Zoster (Shingles) Discussed with the patient, she is interested in this and will perform on the next visit when available  Last Pneumovax in 2010   Screening / Special Tests:  Mammogram advised yearly.  Pap Smear not necessary due to having prior hysterectomy for benign disease.  FOBT annually    Waylon Koffler 11/14/2012, 1:26 PM

## 2012-11-13 NOTE — Patient Instructions (Signed)
Will change ramipril to 5 mg from the next prescription  Consider shingles vaccine on the next visit

## 2012-11-14 ENCOUNTER — Encounter: Payer: Self-pay | Admitting: Endocrinology

## 2012-11-14 DIAGNOSIS — G2581 Restless legs syndrome: Secondary | ICD-10-CM | POA: Insufficient documentation

## 2012-11-14 DIAGNOSIS — M1990 Unspecified osteoarthritis, unspecified site: Secondary | ICD-10-CM | POA: Insufficient documentation

## 2012-12-06 ENCOUNTER — Ambulatory Visit
Admission: RE | Admit: 2012-12-06 | Discharge: 2012-12-06 | Disposition: A | Discharge status: Home / Self Care | Payer: BC Managed Care – PPO | Source: Ambulatory Visit

## 2012-12-06 DIAGNOSIS — Z1231 Encounter for screening mammogram for malignant neoplasm of breast: Secondary | ICD-10-CM

## 2013-01-24 ENCOUNTER — Other Ambulatory Visit (HOSPITAL_COMMUNITY): Payer: Self-pay | Admitting: Interventional Radiology

## 2013-01-24 ENCOUNTER — Telehealth (HOSPITAL_COMMUNITY): Payer: Self-pay | Admitting: Interventional Radiology

## 2013-01-24 DIAGNOSIS — I729 Aneurysm of unspecified site: Secondary | ICD-10-CM

## 2013-01-24 NOTE — Telephone Encounter (Signed)
Called pt left VM for her to call to schedule CT angio appt JMichaux

## 2013-01-29 ENCOUNTER — Other Ambulatory Visit: Payer: Self-pay | Admitting: Radiology

## 2013-01-29 ENCOUNTER — Ambulatory Visit (HOSPITAL_COMMUNITY)
Admission: RE | Admit: 2013-01-29 | Discharge: 2013-01-29 | Disposition: A | Discharge status: Home / Self Care | Payer: BC Managed Care – PPO | Source: Ambulatory Visit | Attending: Interventional Radiology | Admitting: Interventional Radiology

## 2013-01-29 DIAGNOSIS — I729 Aneurysm of unspecified site: Secondary | ICD-10-CM

## 2013-01-29 DIAGNOSIS — I671 Cerebral aneurysm, nonruptured: Secondary | ICD-10-CM | POA: Insufficient documentation

## 2013-01-29 LAB — BASIC METABOLIC PANEL
BUN: 28 mg/dL — ABNORMAL HIGH (ref 6–23)
CO2: 28 mEq/L (ref 19–32)
Calcium: 9.8 mg/dL (ref 8.4–10.5)
Creatinine, Ser: 1.16 mg/dL — ABNORMAL HIGH (ref 0.50–1.10)
GFR calc non Af Amer: 47 mL/min — ABNORMAL LOW (ref >90)
Glucose, Bld: 87 mg/dL (ref 70–99)

## 2013-01-29 MED ORDER — IOHEXOL 350 MG/ML SOLN
100.0000 mL | Freq: Once | INTRAVENOUS | Status: AC | PRN
  Administered 2013-01-29: 80 mL via INTRAVENOUS

## 2013-02-01 ENCOUNTER — Telehealth: Payer: Self-pay | Admitting: Endocrinology

## 2013-02-01 NOTE — Telephone Encounter (Signed)
Dr. Lucianne Muss cleared medically, Cardiologist needs to clear on Cardiac.

## 2013-02-02 ENCOUNTER — Other Ambulatory Visit: Payer: Self-pay | Admitting: *Deleted

## 2013-02-02 MED ORDER — FENOFIBRATE MICRONIZED 134 MG PO CAPS: 134.0000 mg | ORAL_CAPSULE | Freq: Every day | ORAL | Status: DC

## 2013-02-10 DIAGNOSIS — I639 Cerebral infarction, unspecified: Secondary | ICD-10-CM

## 2013-02-10 HISTORY — DX: Cerebral infarction, unspecified: I63.9

## 2013-02-18 ENCOUNTER — Encounter: Payer: Self-pay | Admitting: Cardiology

## 2013-02-21 ENCOUNTER — Ambulatory Visit (INDEPENDENT_AMBULATORY_CARE_PROVIDER_SITE_OTHER): Payer: BC Managed Care – PPO | Admitting: Cardiology

## 2013-02-21 ENCOUNTER — Encounter: Payer: Self-pay | Admitting: Cardiology

## 2013-02-21 VITALS — BP 132/70 | HR 62 | Ht 66.5 in | Wt 236.8 lb

## 2013-02-21 DIAGNOSIS — M25562 Pain in left knee: Secondary | ICD-10-CM

## 2013-02-21 DIAGNOSIS — Z01818 Encounter for other preprocedural examination: Secondary | ICD-10-CM

## 2013-02-21 DIAGNOSIS — E785 Hyperlipidemia, unspecified: Secondary | ICD-10-CM

## 2013-02-21 DIAGNOSIS — I10 Essential (primary) hypertension: Secondary | ICD-10-CM

## 2013-02-21 DIAGNOSIS — E669 Obesity, unspecified: Secondary | ICD-10-CM

## 2013-02-21 DIAGNOSIS — M25569 Pain in unspecified knee: Secondary | ICD-10-CM

## 2013-02-21 NOTE — Patient Instructions (Signed)
Your physician recommends that you continue on your current medications as directed. Please refer to the Current Medication list given to you today.  Your physician recommends that you follow-up as needed.  

## 2013-02-21 NOTE — Progress Notes (Signed)
[address], [address] [place], [place]  [address] Phone: [phone] Fax:  [phone]  Date:  02/21/2013   ID:  Aimee Hood, Aimee Hood 1941/02/03, MRN [id number]  PCP:  Reather Littler, MD   History of Present Illness: Aimee Hood is a 72 y.o. female here for surgical clearance. Primary physician Dr. Lucianne Muss. She has a history of hypertension currently on ACE inhibitor usually under good control. Hyperlipidemia with especially hypertriglyceridemia , triglycerides of 08/05/2005 noted. Triglycerides and LDL are now excellent. She has been having left outer leg pain more so when getting up after a seated position with no weakness.  She is having surgery with Dr. Cleophas Dunker of orthopedics. Left total knee replacement. She knows Mylinda Latina, patient of mine. Brother with CAD at 66.  She is able to complete greater than 4 METs of activity on a daily basis. Yesterday she walked for 25 minutes without difficulty from a cardiovascular standpoint. Of course her knee was quite bothersome to her. She's not had any significant shortness of breath. No syncope. No palpitations. No prior cardiovascular illness. Nondiabetic, nonsmoker.   Wt Readings from Last 3 Encounters:  02/21/13 236 lb 12.8 oz (107.412 kg)  11/13/12 220 lb 1.6 oz (99.837 kg)     Past Medical History  Diagnosis Date  . Arthritis   . Diverticulitis   . Carpal tunnel syndrome of left wrist   . Colon polyp 2007  . Allergy   . Cerebral aneurysm, nonruptured     2-3 mm in size, stable    Past Surgical History  Procedure Laterality Date  . Cholecystectomy    . Breast surgery      Current Outpatient Prescriptions  Medication Sig Dispense Refill  . Calcium Citrate-Vitamin D (CITRACAL + D PO) Take by mouth.      . cholecalciferol (VITAMIN D) 400 UNITS TABS tablet Take 1,000 Units by mouth daily. VITAMIN D3  2 each day      . clonazePAM (KLONOPIN) 0.5 MG tablet Take 0.5 mg by mouth 2 (two) times daily as needed for  anxiety.      . fenofibrate micronized (LOFIBRA) 134 MG capsule Take 1 capsule (134 mg total) by mouth daily before breakfast.  30 capsule  4  . naproxen sodium (ANAPROX) 220 MG tablet Take 220 mg by mouth 2 (two) times daily with a meal.      . omeprazole (PRILOSEC) 20 MG capsule Take 20 mg by mouth daily.      . ramipril (ALTACE) 10 MG tablet Take 0.5 tablets (5 mg total) by mouth daily.  90 tablet  3   No current facility-administered medications for this visit.    Allergies:    Allergies  Allergen Reactions  . Codeine     Social History:  The patient  reports that she has never smoked. She does not have any smokeless tobacco history on file.   Family History  Problem Relation Age of Onset  . Diabetes Brother   . Heart disease Brother   . Diabetes Paternal Aunt   . Cancer Maternal Grandmother     ROS:  Please see the history of present illness.   No bleeding, no syncope, no orthopnea, no PND. No excessive shortness of breath or chest pain with exertion.   All other systems reviewed and negative.   PHYSICAL EXAM: VS:  BP 132/70  Pulse 62  Ht 5' 6.5" (1.689 m)  Wt 236 lb 12.8 oz (107.412 kg)  BMI  37.65 kg/m2 Well nourished, well developed, in no acute distress HEENT: normal, [place]/AT, EOMI Neck: no JVD, normal carotid upstroke, no bruit Cardiac:  normal S1, S2; RRR; no murmur Lungs:  clear to auscultation bilaterally, no wheezing, rhonchi or rales Abd: soft, nontender, no hepatomegaly, no bruitsoverweight Ext: no edema, 2+ distal pulses Skin: warm and dry GU: deferred Neuro: no focal abnormalities noted, AAO x 3  EKG:  Normal sinus rhythm, rate 62 beats per minute, no other specific changes.  ASSESSMENT AND PLAN:  1. Preoperative risk assessment-plan left total knee replacement by Dr. Norlene Campbell. She is nondiabetic, nonsmoker, no prior cardiovascular history. Her EKG is reassuring. She's not having any exertional anginal symptoms. She is able to complete greater  than 4 METs of activity without difficulty. Based upon these findings, she may proceed with surgery, left total knee replacement which is intermediate risk surgery, with low overall cardiac risk. Risk of cardiovascular complications should be less than 2%. I discussed with patient. I do not believe that further cardiac testing is necessary in this setting based upon ACC guidelines. Continue to monitor blood pressure. 2. Obesity-encourage weight loss. Once knee is replaced, this will be motivating factor. 3. Hypertriglyceridemia/hyperlipidemia-currently on fenofibrate. Dr. Lucianne Muss. No changes. Diet. Exercise. Weight loss. 4. Hypertension-currently under good control with ACE inhibitor. 5. I will see her back on as-needed basis. 6. Hypertension 7. Hyperlipidemia/hypertriglyceridemia 8. Leg pain  Signed, Donato Schultz, MD [facility]  02/21/2013 9:54 AM

## 2013-02-22 ENCOUNTER — Other Ambulatory Visit: Payer: Self-pay | Admitting: *Deleted

## 2013-02-22 MED ORDER — OMEPRAZOLE 20 MG PO CPDR: 20.0000 mg | DELAYED_RELEASE_CAPSULE | Freq: Every day | ORAL | Status: DC

## 2013-02-22 MED ORDER — CLONAZEPAM 0.5 MG PO TABS: 0.5000 mg | ORAL_TABLET | Freq: Two times a day (BID) | ORAL | Status: DC | PRN

## 2013-03-09 ENCOUNTER — Encounter (HOSPITAL_BASED_OUTPATIENT_CLINIC_OR_DEPARTMENT_OTHER): Payer: Self-pay | Admitting: Emergency Medicine

## 2013-03-09 ENCOUNTER — Inpatient Hospital Stay (HOSPITAL_BASED_OUTPATIENT_CLINIC_OR_DEPARTMENT_OTHER)
Admission: EM | Admit: 2013-03-09 | Discharge: 2013-03-11 | DRG: 062 | Disposition: A | Discharge status: Home / Self Care | Payer: BC Managed Care – PPO | Attending: Neurology | Admitting: Neurology

## 2013-03-09 ENCOUNTER — Inpatient Hospital Stay (HOSPITAL_COMMUNITY): Payer: BC Managed Care – PPO

## 2013-03-09 ENCOUNTER — Emergency Department (HOSPITAL_BASED_OUTPATIENT_CLINIC_OR_DEPARTMENT_OTHER): Payer: BC Managed Care – PPO

## 2013-03-09 DIAGNOSIS — N390 Urinary tract infection, site not specified: Secondary | ICD-10-CM | POA: Diagnosis present

## 2013-03-09 DIAGNOSIS — M129 Arthropathy, unspecified: Secondary | ICD-10-CM | POA: Diagnosis present

## 2013-03-09 DIAGNOSIS — E669 Obesity, unspecified: Secondary | ICD-10-CM | POA: Diagnosis present

## 2013-03-09 DIAGNOSIS — Z860100 Personal history of colon polyps, unspecified: Secondary | ICD-10-CM

## 2013-03-09 DIAGNOSIS — E785 Hyperlipidemia, unspecified: Secondary | ICD-10-CM

## 2013-03-09 DIAGNOSIS — Z8673 Personal history of transient ischemic attack (TIA), and cerebral infarction without residual deficits: Secondary | ICD-10-CM | POA: Diagnosis present

## 2013-03-09 DIAGNOSIS — I6340 Cerebral infarction due to embolism of unspecified cerebral artery: Principal | ICD-10-CM | POA: Diagnosis present

## 2013-03-09 DIAGNOSIS — I10 Essential (primary) hypertension: Secondary | ICD-10-CM | POA: Diagnosis present

## 2013-03-09 DIAGNOSIS — R29810 Facial weakness: Secondary | ICD-10-CM | POA: Diagnosis present

## 2013-03-09 DIAGNOSIS — I6350 Cerebral infarction due to unspecified occlusion or stenosis of unspecified cerebral artery: Secondary | ICD-10-CM

## 2013-03-09 DIAGNOSIS — Z6837 Body mass index (BMI) 37.0-37.9, adult: Secondary | ICD-10-CM

## 2013-03-09 DIAGNOSIS — I639 Cerebral infarction, unspecified: Secondary | ICD-10-CM

## 2013-03-09 DIAGNOSIS — R4701 Aphasia: Secondary | ICD-10-CM | POA: Diagnosis present

## 2013-03-09 DIAGNOSIS — I059 Rheumatic mitral valve disease, unspecified: Secondary | ICD-10-CM

## 2013-03-09 LAB — DIFFERENTIAL
Basophils Absolute: 0 10*3/uL (ref 0.0–0.1)
Basophils Relative: 1 % (ref 0–1)
Eosinophils Relative: 3 % (ref 0–5)
Lymphocytes Relative: 22 % (ref 12–46)
Lymphs Abs: 1.6 10*3/uL (ref 0.7–4.0)
Monocytes Absolute: 0.8 10*3/uL (ref 0.1–1.0)
Neutro Abs: 4.6 10*3/uL (ref 1.7–7.7)
Neutrophils Relative %: 63 % (ref 43–77)

## 2013-03-09 LAB — RAPID URINE DRUG SCREEN, HOSP PERFORMED
Amphetamines: NOT DETECTED
Barbiturates: NOT DETECTED
Cocaine: NOT DETECTED
Tetrahydrocannabinol: NOT DETECTED

## 2013-03-09 LAB — COMPREHENSIVE METABOLIC PANEL
ALT: 18 U/L (ref 0–35)
AST: 20 U/L (ref 0–37)
Albumin: 4.2 g/dL (ref 3.5–5.2)
Alkaline Phosphatase: 64 U/L (ref 39–117)
CO2: 24 mEq/L (ref 19–32)
Calcium: 9.5 mg/dL (ref 8.4–10.5)
Chloride: 105 mEq/L (ref 96–112)
Potassium: 3.9 mEq/L (ref 3.5–5.1)
Total Bilirubin: 0.4 mg/dL (ref 0.3–1.2)

## 2013-03-09 LAB — CBC
HCT: 39.2 % (ref 36.0–46.0)
Hemoglobin: 12.9 g/dL (ref 12.0–15.0)
MCHC: 32.9 g/dL (ref 30.0–36.0)
RBC: 4.33 MIL/uL (ref 3.87–5.11)
RDW: 12.9 % (ref 11.5–15.5)
WBC: 7.2 10*3/uL (ref 4.0–10.5)

## 2013-03-09 LAB — GLUCOSE, CAPILLARY: Glucose-Capillary: 87 mg/dL (ref 70–99)

## 2013-03-09 LAB — MRSA PCR SCREENING: MRSA by PCR: NEGATIVE

## 2013-03-09 LAB — URINALYSIS, ROUTINE W REFLEX MICROSCOPIC
Bilirubin Urine: NEGATIVE
Nitrite: NEGATIVE
Specific Gravity, Urine: 1.011 (ref 1.005–1.030)
Urobilinogen, UA: 0.2 mg/dL (ref 0.0–1.0)

## 2013-03-09 LAB — APTT: aPTT: 30 seconds (ref 24–37)

## 2013-03-09 LAB — ETHANOL: Alcohol, Ethyl (B): <11 mg/dL (ref 0–11)

## 2013-03-09 LAB — TROPONIN I: Troponin I: <0.3 ng/mL (ref <0.30)

## 2013-03-09 LAB — URINE MICROSCOPIC-ADD ON

## 2013-03-09 LAB — PROTIME-INR: INR: 0.95 (ref 0.00–1.49)

## 2013-03-09 MED ORDER — ALTEPLASE (STROKE) FULL DOSE INFUSION
90.0000 mg | Freq: Once | INTRAVENOUS | Status: DC
  Filled 2013-03-09: qty 90

## 2013-03-09 MED ORDER — LABETALOL HCL 5 MG/ML IV SOLN: 10.0000 mg | INTRAVENOUS | Status: DC | PRN

## 2013-03-09 MED ORDER — INFLUENZA VAC SPLIT QUAD 0.5 ML IM SUSP
0.5000 mL | INTRAMUSCULAR | Status: AC
  Administered 2013-03-10: 0.5 mL via INTRAMUSCULAR
  Filled 2013-03-09: qty 0.5

## 2013-03-09 MED ORDER — ACETAMINOPHEN 325 MG PO TABS
650.0000 mg | ORAL_TABLET | ORAL | Status: DC | PRN
  Administered 2013-03-09 – 2013-03-11 (×4): 650 mg via ORAL
  Filled 2013-03-09 (×4): qty 2

## 2013-03-09 MED ORDER — ALTEPLASE (STROKE) FULL DOSE INFUSION
0.9000 mg/kg | Freq: Once | INTRAVENOUS | Status: DC
  Administered 2013-03-09: 81 mg via INTRAVENOUS

## 2013-03-09 MED ORDER — ALTEPLASE 100 MG IV SOLR
INTRAVENOUS | Status: AC
  Administered 2013-03-09: 81 mg via INTRAVENOUS
  Filled 2013-03-09: qty 1

## 2013-03-09 MED ORDER — PANTOPRAZOLE SODIUM 40 MG IV SOLR
40.0000 mg | Freq: Every day | INTRAVENOUS | Status: DC
  Administered 2013-03-09: 40 mg via INTRAVENOUS
  Filled 2013-03-09 (×2): qty 40

## 2013-03-09 MED ORDER — ACETAMINOPHEN 650 MG RE SUPP: 650.0000 mg | RECTAL | Status: DC | PRN

## 2013-03-09 MED ORDER — SODIUM CHLORIDE 0.9 % IV SOLN
INTRAVENOUS | Status: DC
  Administered 2013-03-09 – 2013-03-10 (×2): via INTRAVENOUS

## 2013-03-09 NOTE — ED Notes (Signed)
CBG 85

## 2013-03-09 NOTE — ED Notes (Signed)
Pts son reports that pt started getting confused, dazed and unable to speak at 11am today.  Pt is alert to person.  Noted to have a (L) sided droop, unable to puff cheeks out.  Pt repetitive speaking.

## 2013-03-09 NOTE — ED Notes (Signed)
Transfer received from [facility]- code stroke -- holding for a bed on 3100. TPA started at [facility] at 1212. Rapid Response/Stroke RN at bedside -- completing NIH scale

## 2013-03-09 NOTE — ED Provider Notes (Signed)
CSN: [id number]     Arrival date & time 03/09/13  1134 History   First MD Initiated Contact with Patient 03/09/13 1140     Chief Complaint  Patient presents with  . Code Stroke   (Consider location/radiation/quality/duration/timing/severity/associated sxs/prior Treatment) HPI  72 year old female here with dysarthria and suppressive aphasia last seen at baseline at 50 AM. Her son explains that they were at the [facility] and she got confused and was unable to speak on their way home in the car. He explains that no matter what he asked she responded with "I'm okay". She has left knee problems and is scheduled for a left knee replacement next week. He was unable to appreciate any weakness or numbness but states that she  Seemed like she was having a hard time finding her words and that they were slurred. She denies any weakness or numbness and states that she feels fine. She has a Hx of an aneurysm.   In triage she cannot tell nursing her name or puff out her cheeks. When discussing with her son she has a right labial fold droop.   Past Medical History  Diagnosis Date  . Arthritis   . Diverticulitis   . Carpal tunnel syndrome of left wrist   . Colon polyp 2007  . Allergy   . Cerebral aneurysm, nonruptured     2-3 mm in size, stable   Past Surgical History  Procedure Laterality Date  . Cholecystectomy    . Breast surgery     Family History  Problem Relation Age of Onset  . Diabetes Brother   . Heart disease Brother 49  . Diabetes Paternal Aunt   . Cancer Maternal Grandmother    History  Substance Use Topics  . Smoking status: Never Smoker   . Smokeless tobacco: Not on file  . Alcohol Use: Not on file   OB History   Grav Para Term Preterm Abortions TAB SAB Ect Mult Living                 Review of Systems  Constitutional: Negative for fever.  Respiratory: Negative for shortness of breath.   Cardiovascular: Negative for chest pain.  Gastrointestinal: Negative for abdominal  pain.  Neurological: Negative for headaches.  All other systems reviewed and are negative.    Allergies  Codeine  Home Medications   Current Outpatient Rx  Name  Route  Sig  Dispense  Refill  . Calcium Citrate-Vitamin D (CITRACAL + D PO)   Oral   Take by mouth.         . cholecalciferol (VITAMIN D) 400 UNITS TABS tablet   Oral   Take 1,000 Units by mouth daily. VITAMIN D3  2 each day         . clonazePAM (KLONOPIN) 0.5 MG tablet   Oral   Take 1 tablet (0.5 mg total) by mouth 2 (two) times daily as needed for anxiety.   90 tablet   1   . fenofibrate micronized (LOFIBRA) 134 MG capsule   Oral   Take 1 capsule (134 mg total) by mouth daily before breakfast.   30 capsule   4   . naproxen sodium (ANAPROX) 220 MG tablet   Oral   Take 220 mg by mouth 2 (two) times daily with a meal.         . omeprazole (PRILOSEC) 20 MG capsule   Oral   Take 1 capsule (20 mg total) by mouth daily.   90 capsule  3   . ramipril (ALTACE) 10 MG tablet   Oral   Take 0.5 tablets (5 mg total) by mouth daily.   90 tablet   3    BP 179/70  Pulse 66  Temp(Src) 98 F (36.7 C) (Oral)  Resp 20  SpO2 100% Physical Exam  Nursing note and vitals reviewed. Constitutional: She is oriented to person, place, and time. She appears well-developed and well-nourished. No distress.  HENT:  Head: Normocephalic and atraumatic.  Eyes: EOM are normal. Pupils are equal, round, and reactive to light.  Neck: Neck supple.  Cardiovascular: Normal rate, regular rhythm and normal heart sounds.   No murmur heard. Pulmonary/Chest: Effort normal and breath sounds normal.  Abdominal: Soft. Bowel sounds are normal. There is no tenderness. There is no guarding.  Musculoskeletal: She exhibits no edema.  Neurological: She is alert and oriented to person, place, and time.  Intermittently unable to answer orientation questions R facial droop at labial fold CN2-12 intact on my exam Strength 5/5 and sensation  intact in all 4 extremities.   Skin: Skin is warm and dry. She is not diaphoretic.  Psychiatric: She has a normal mood and affect.    ED Course  Procedures (including critical care time) Labs Review Labs Reviewed  GLUCOSE, CAPILLARY  ETHANOL  PROTIME-INR  APTT  CBC  DIFFERENTIAL  COMPREHENSIVE METABOLIC PANEL  URINE RAPID DRUG SCREEN (HOSP PERFORMED)  URINALYSIS, ROUTINE W REFLEX MICROSCOPIC  TROPONIN I   Imaging Review Ct Head Wo Contrast  03/09/2013   CLINICAL DATA:  Code stroke.  Unable to speak  EXAM: CT HEAD WITHOUT CONTRAST  TECHNIQUE: Contiguous axial images were obtained from the base of the skull through the vertex without intravenous contrast.  COMPARISON:  CT 01/29/2013  FINDINGS: Ventricle size is normal. Negative for acute infarct, hemorrhage, or mass lesion.  Mucosal edema left sphenoid sinus.  No acute bony abnormality.  IMPRESSION: No acute abnormality.  Critical Value/emergent results were called by telephone at the time of interpretation on 03/09/2013 at 11:57 AM to Dr.ROBERT LOCKWOOD , who verbally acknowledged these results.   Electronically Signed   By: Marlan Palau M.D.   On: 03/09/2013 11:58    EKG Interpretation   None       MDM   1. Stroke     72 year old female here with dysarthria and right facial droop suspicious for acute stroke. Symptoms onset less than one hour ago and CT head negative for intercurrent bleed. Discussed with neurology who recommends proceeding with t-PA treatment and transferred [facility]. Code stroke was activated and a ambulance is en route for transfer.      Elenora Gamma, MD 03/09/13 1215

## 2013-03-09 NOTE — Progress Notes (Signed)
UR completed.  Tashawn Laswell, RN BSN M[facility] CCM Trauma/Neuro ICU Case Manager [phone]  

## 2013-03-09 NOTE — ED Notes (Signed)
Dr. Kirkpatrick at bedside 

## 2013-03-09 NOTE — H&P (Addendum)
Neurology H&P  CC: Aphasia  History is obtained from: Patient, son  HPI: Aimee Hood is a 72 y.o. female who was riding in a car earlier at which point she noticed something was wrong and was unable to verbalize it. Her son states that she had repetitive speech perseverating on "I'm okay" and not making any sense. She states that she was unable to understand things or being said to her.  She was evaluated in the emergency room where was found to have both receptive and expressive aphasia and therefore after calling me, decided with the emergency room physician to treat with IV TPA. She is continuously improved and is close, but not completely back to baseline on arrival.   LKW: 11 AM tpa given?: Yes NIHSS: 2, aphasia and facial droop    ROS: A 14 point ROS was performed and is negative except as noted in the HPI.  Past Medical History  Diagnosis Date  . Arthritis   . Diverticulitis   . Carpal tunnel syndrome of left wrist   . Colon polyp 2007  . Allergy   . Cerebral aneurysm, nonruptured     2-3 mm in size, stable    Family History: No history of stroke  Social History: Tob: Denies  Exam: Current vital signs: BP 134/59  Pulse 66  Temp(Src) 97.8 F (36.6 C) (Oral)  Resp 15  Wt 107.049 kg (236 lb)  SpO2 100% Vital signs in last 24 hours: Temp:  [97.8 F (36.6 C)-98 F (36.7 C)] 97.8 F (36.6 C) (11/28 1249) Pulse Rate:  [64-69] 66 (11/28 1330) Resp:  [12-20] 15 (11/28 1330) BP: (134-179)/(59-76) 134/59 mmHg (11/28 1330) SpO2:  [100 %] 100 % (11/28 1330) Weight:  [107.049 kg (236 lb)] 107.049 kg (236 lb) (11/28 1208)  General: In bed, NAD CV: Regular rate and rhythm Mental Status: Patient is awake, alert, oriented to person, place, month, year, and situation. Immediate and remote memory are intact. Patient is able to give a clear and coherent history. No signs of  Neglect she has mild expressive aphasia Cranial Nerves: II: Visual Fields are full. Pupils  are equal, round, and reactive to light.  Discs are difficult to visualize. III,IV, VI: EOMI without ptosis or diploplia.  V: Facial sensation is symmetric to temperature VII: Facial movement is notable for flattening of the right nasolabial fold VIII: hearing is intact to voice X: Uvula elevates symmetrically XI: Shoulder shrug is symmetric. XII: tongue is midline without atrophy or fasciculations.  Motor: Tone is normal. Bulk is normal. 5/5 strength was present in all four extremities.  Sensory: Sensation is symmetric to light touch and temperature in the arms and legs. Deep Tendon Reflexes: 2+ and symmetric in the biceps and patellae.  Plantars: Toes are downgoing bilaterally.  Cerebellar: FNF intact bilaterally Gait: Not assessed due to acute nature of evaluation and multiple medical monitors in ED setting.          I have reviewed labs in epic and the results pertinent to this consultation are: INR 0.95 CBC no leukocytosis CMP-unremarkable  I have reviewed the images obtained: CT head-negative  Impression: 72 year old female with acute onset aphasia and right facial droop which has mostly resolved status post TPA. I do suspect that she has had a small stroke as she does have some persistent flattening of her right nasolabial fold. She will be admitted to the ICU and worked up for stroke.  She does have a small unruptured aneurysm, though I'm not sure  if embolus from this area was related to her current presentation.  She was on no antiplatelet medication prior to admission  Recommendations: 1. HgbA1c, fasting lipid panel 2. MRI, MRA  of the brain without contrast 3. Frequent neuro checks 4. Echocardiogram 5. Carotid dopplers 6. Prophylactic therapy-Antiplatelet med: Aspirin - dose 325mg  if CT head at 24 hours is negative, nontender first 24 hours 7. Risk factor modification 8. Telemetry monitoring 9. PT consult, OT consult, Speech consult    This patient is  critically ill and at significant risk of neurological worsening, death and care requires constant monitoring of vital signs, hemodynamics,respiratory and cardiac monitoring, neurological assessment, discussion with family, other specialists and medical decision making of high complexity. I spent 45 minutes of neurocritical care time  in the care of  this patient.  Ritta Slot, MD [facility] [phone]  If 7pm- 7am, please page neurology on call at [phone].  03/09/2013  2:31 PM

## 2013-03-09 NOTE — ED Provider Notes (Signed)
MSE was initiated and I personally evaluated the patient and placed orders (if any) at  12:59 PM on March 09, 2013.  The patient appears stable so that the remainder of the MSE may be completed by another provider.  Patient received from ems after transfer from [facility].  Patient seen at [facility] and evaluated for cva with last known normal of 11 a.m.  She had word finding difficulties and right facial droop.  Patient received tpa pt transfer in consultation with neurology.  She was accepted to neuro icu but was brought to the ed as no bed initially available.  I have seen and evaluated the patient and she has minimal right facial droop now.  She is awake and alert and follows al instruction.  She is hemodynamically stable with bp 150/69.  Dr. Amada Jupiter has arrived and assumes care.    Hilario Quarry, MD 03/09/13 [phone]

## 2013-03-09 NOTE — ED Provider Notes (Signed)
The patient was seen in conjunction with the Resident Physician, Dr. Ermalinda Memos.  The documentation is an accurate reflection of the patient encounter.  On my exam, this patient who had developed deficits within one hour prior to ED arrival have dysarthria, expressive aphasia, faint facial droop.  The initial encounter was expeditious, and the patient had CT, EKG, labs performed immediately.  After the initial physical exam I discussed the patient's case with our neurologist.  We agreed to given the short time since onset of symptoms, the ongoing verbal deficits, the patient may be a candidate for TPA.  After we reviewed her CT scan, the patient had initiation of TPA.  During this process I discussed her care with our transfer team, and we arranged for rapid transfer to the neuro ICU given the persistence neurologic deficits, the ongoing infusion of TPA. All results, soft, measures were discussed with the patient and her son.  The patient's son is not the power of attorney, but given the patient's incapacitated state, he agreed with the recommendation for TPA after a discussion on risks and benefits. Patient was transferred from this facility to higher level of care.  (Please see MUSE for my ECG interpretation - SR 65, t wave changes, abnormal)  CRITICAL CARE Performed by: Gerhard Munch Total critical care time: 35 Critical care time was exclusive of separately billable procedures and treating other patients. Critical care was necessary to treat or prevent imminent or life-threatening deterioration. Critical care was time spent personally by me on the following activities: development of treatment plan with patient and/or surrogate as well as nursing, discussions with consultants, evaluation of patient's response to treatment, examination of patient, obtaining history from patient or surrogate, ordering and performing treatments and interventions, ordering and review of laboratory studies, ordering  and review of radiographic studies, pulse oximetry and re-evaluation of patient's condition.   Gerhard Munch, MD 03/09/13 [phone]

## 2013-03-09 NOTE — Progress Notes (Signed)
  Echocardiogram 2D Echocardiogram has been performed.  Georgian Co 03/09/2013, 4:31 PM

## 2013-03-09 NOTE — ED Notes (Signed)
Pt stated was [place] [employer]-- felt fine--once in car to leave, started getting a headache and could not understand what her son was saying. Son took pt to [facility].

## 2013-03-10 ENCOUNTER — Inpatient Hospital Stay (HOSPITAL_COMMUNITY): Payer: BC Managed Care – PPO

## 2013-03-10 DIAGNOSIS — I6350 Cerebral infarction due to unspecified occlusion or stenosis of unspecified cerebral artery: Secondary | ICD-10-CM

## 2013-03-10 LAB — LIPID PANEL
Cholesterol: 134 mg/dL (ref 0–200)
HDL: 37 mg/dL — ABNORMAL LOW (ref >39)
Total CHOL/HDL Ratio: 3.6 RATIO

## 2013-03-10 LAB — HEMOGLOBIN A1C
Hgb A1c MFr Bld: 5.5 % (ref <5.7)
Mean Plasma Glucose: 111 mg/dL (ref <117)

## 2013-03-10 MED ORDER — ENOXAPARIN SODIUM 40 MG/0.4ML ~~LOC~~ SOLN
40.0000 mg | SUBCUTANEOUS | Status: DC
  Administered 2013-03-10: 40 mg via SUBCUTANEOUS
  Filled 2013-03-10 (×2): qty 0.4

## 2013-03-10 MED ORDER — ASPIRIN 325 MG PO TABS
325.0000 mg | ORAL_TABLET | Freq: Every day | ORAL | Status: DC
  Administered 2013-03-10 – 2013-03-11 (×2): 325 mg via ORAL
  Filled 2013-03-10: qty 1

## 2013-03-10 MED ORDER — PANTOPRAZOLE SODIUM 40 MG PO TBEC
40.0000 mg | DELAYED_RELEASE_TABLET | Freq: Every day | ORAL | Status: DC
  Administered 2013-03-10 – 2013-03-11 (×2): 40 mg via ORAL
  Filled 2013-03-10 (×2): qty 1

## 2013-03-10 MED ORDER — FENOFIBRATE 160 MG PO TABS
160.0000 mg | ORAL_TABLET | Freq: Every day | ORAL | Status: DC
  Administered 2013-03-10 – 2013-03-11 (×2): 160 mg via ORAL
  Filled 2013-03-10 (×2): qty 1

## 2013-03-10 MED ORDER — CHOLECALCIFEROL 10 MCG (400 UNIT) PO TABS
400.0000 [IU] | ORAL_TABLET | Freq: Every day | ORAL | Status: DC
  Administered 2013-03-10 – 2013-03-11 (×2): 400 [IU] via ORAL
  Filled 2013-03-10 (×2): qty 1

## 2013-03-10 MED ORDER — CLONAZEPAM 0.5 MG PO TABS: 0.5000 mg | ORAL_TABLET | Freq: Two times a day (BID) | ORAL | Status: DC | PRN

## 2013-03-10 MED ORDER — PANTOPRAZOLE SODIUM 40 MG PO TBEC: 40.0000 mg | DELAYED_RELEASE_TABLET | Freq: Every day | ORAL | Status: DC

## 2013-03-10 MED ORDER — RAMIPRIL 10 MG PO CAPS
10.0000 mg | ORAL_CAPSULE | Freq: Every day | ORAL | Status: DC
  Administered 2013-03-10 – 2013-03-11 (×2): 10 mg via ORAL
  Filled 2013-03-10: qty 1

## 2013-03-10 MED ORDER — RAMIPRIL 10 MG PO CAPS
10.0000 mg | ORAL_CAPSULE | Freq: Every day | ORAL | Status: DC
Start: 2013-03-10 — End: 2013-03-10
  Filled 2013-03-10: qty 1

## 2013-03-10 NOTE — Evaluation (Signed)
Occupational Therapy Evaluation Patient Details Name: Aimee Hood MRN: [id number] DOB: 1940-07-14 Today's Date: 03/10/2013 Time: 1478-2956 OT Time Calculation (min): 14 min  OT Assessment / Plan / Recommendation History of present illness Aimee Hood is a 72 y.o. female who was riding in a car earlier at which point she noticed something was wrong and was unable to verbalize it. Her son states that she had repetitive speech perseverating on "I'm okay" and not making any sense. She states that she was unable to understand things or being said to her.  Pt admitted for expressive and resceptive aphasia and TPA given.  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    Clinical Impression   Pt admitted with above.  She is at a mod I level with ADLs and functional mobility.  Pt requires increased time for mobility due to left knee pain (was supposed to get L knee replaced Dec. 16).  Continues to present with expressive difficulties and would possibly benefit from speech therapy. No further acute OT needs. Will sign off.    OT Assessment  Patient does not need any further OT services    Follow Up Recommendations  No OT follow up    Barriers to Discharge      Equipment Recommendations  None recommended by OT    Recommendations for Other Services Speech consult  Frequency       Precautions / Restrictions     Pertinent Vitals/Pain See vitals    ADL  Lower Body Dressing: Performed;Modified independent Where Assessed - Lower Body Dressing: Unsupported sit to stand Toilet Transfer: Simulated;Modified independent Toilet Transfer Method: Sit to stand Equipment Used: Cane;Gait belt Transfers/Ambulation Related to ADLs: Mod I with cane. Incr time due to left knee pain.    OT Diagnosis:    OT Problem List:   OT Treatment Interventions:     OT Goals(Current goals can be found in the care plan section)    Visit Information  Last OT Received On: 03/10/13 Assistance Needed: +1 History of Present Illness: Aimee Hood is a 72 y.o. female who was riding in a car earlier at which point she noticed something was wrong and was unable to verbalize it. Her son states that she had repetitive speech perseverating on "I'm okay" and not making any sense. She states that she was unable to understand things or being said to her.  Pt admitted for expressive and resceptive aphasia and TPA given.  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        Prior Functioning     Home Living Family/patient expects to be discharged to:: Private residence Living Arrangements: Spouse/significant other Available Help at Discharge: Family;Available PRN/intermittently Type of Home: House Home Access: Stairs to enter Entrance Stairs-Number of Steps: 1 Home Layout: One level Home Equipment: Walker - 2 wheels;Bedside commode;Cane - single point Prior Function Level of Independence: Independent with assistive device(s) Comments: using cane for L knee pain. Communication Communication: Expressive difficulties         Vision/Perception     Cognition  Cognition Arousal/Alertness: Awake/alert Behavior During Therapy: WFL for tasks assessed/performed Overall Cognitive Status: Difficult to assess Difficult to assess due to: Impaired communication    Extremity/Trunk Assessment Upper Extremity Assessment Upper Extremity Assessment: Overall WFL for tasks assessed     Mobility Bed Mobility Bed Mobility: Supine to Sit;Sit to Supine Supine to Sit: 6: Modified independent (  Device/Increase time) Sit to Supine: 6: Modified independent (Device/Increase time) Transfers Transfers: Sit to Stand;Stand to Sit Sit to Stand: 6: Modified independent (Device/Increase time);From bed     Exercise     Balance     End of Session OT - End of Session Equipment Utilized During  Treatment: Gait belt Activity Tolerance: Patient tolerated treatment well Patient left:  (ambulating with PT) Nurse Communication: Mobility status  GO   03/10/2013 Cipriano Mile OTR/L Pager [phone] Office [phone]   Cipriano Mile 03/10/2013, 3:12 PM

## 2013-03-10 NOTE — Evaluation (Signed)
Physical Therapy Evaluation Patient Details Name: Aimee Hood MRN: [id number] DOB: 31-Aug-1940 Today's Date: 03/10/2013 Time: 4696-2952 PT Time Calculation (min): 21 min  PT Assessment / Plan / Recommendation History of Present Illness  Aimee Hood is a 72 y.o. female who was riding in a car earlier at which point she noticed something was wrong and was unable to verbalize it. Her son states that she had repetitive speech perseverating on "I'm okay" and not making any sense. She states that she was unable to understand things or being said to her.  Pt admitted for expressive and resceptive aphasia and TPA given.  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   Clinical Impression  Pt very agreeable to therapy and reports being at baseline with overall functional mobility.  Pt has chronic left knee pain due to arthritis and ambulates with SPC. Pt has plans for left TKA on 03/27/13 however very concerned surgery will be postpone due CVA.  PT will sign off due to baseline of function and recommend for pt to further discuss TKA with orthopedic surgeon and neurologis    PT Assessment  Patent does not need any further PT services    Follow Up Recommendations  No PT follow up    Equipment Recommendations  None recommended by PT    Precautions / Restrictions Precautions Precautions: None   Pertinent Vitals/Pain Chronic left knee pain but does not rate      Mobility  Bed Mobility Bed Mobility: Supine to Sit;Sit to Supine Supine to Sit: 6: Modified independent (Device/Increase time) Sit to Supine: 6: Modified independent (Device/Increase time) Transfers Transfers: Sit to Stand;Stand to Sit Sit to Stand: 6: Modified independent (Device/Increase time);From bed Ambulation/Gait Ambulation/Gait Assistance: 5: Supervision;6: Modified independent (Device/Increase time) Ambulation Distance (Feet): 100 Feet Assistive device:  Straight cane Ambulation/Gait Assistance Details: Initially minguard for safety due to bedrest; Supervision for safety due to left chronic knee pain and step to gait Gait Pattern: Step-to pattern;Decreased stance time - left;Shuffle;Antalgic;Trunk flexed Gait velocity: decreased due to left knee pain.  Stairs: No Modified Rankin (Stroke Patients Only) Pre-Morbid Rankin Score: No significant disability Modified Rankin: No significant disability    Exercises Total Joint Exercises Ankle Circles/Pumps: Strengthening;AAROM;10 reps;Both Quad Sets: Strengthening;AAROM;Both;10 reps Straight Leg Raises: Strengthening;Both;10 reps   PT Diagnosis:    PT Problem List:   PT Treatment Interventions:       PT Goals(Current goals can be found in the care plan section) Acute Rehab PT Goals PT Goal Formulation: No goals set, d/c therapy  Visit Information  Last PT Received On: 03/10/13 Assistance Needed: +1 History of Present Illness: Aimee Hood is a 72 y.o. female who was riding in a car earlier at which point she noticed something was wrong and was unable to verbalize it. Her son states that she had repetitive speech perseverating on "I'm okay" and not making any sense. She states that she was unable to understand things or being said to her.  Pt admitted for expressive and resceptive aphasia and TPA given.  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        Prior Functioning  Home Living Family/patient expects to be discharged to:: Private residence Living Arrangements: Spouse/significant other Available Help at Discharge: Family;Available PRN/intermittently Type of Home: House Home Access: Stairs to enter Entrance Stairs-Number of Steps: 1 Home Layout: One level Home Equipment: Walker - 2 wheels;Bedside commode;Cane -  single point Prior Function Level of Independence: Independent with assistive device(s) Comments: using  cane for L knee pain. Communication Communication: Expressive difficulties    Cognition  Cognition Arousal/Alertness: Awake/alert Behavior During Therapy: WFL for tasks assessed/performed Overall Cognitive Status: Difficult to assess Difficult to assess due to: Impaired communication    Extremity/Trunk Assessment Upper Extremity Assessment Upper Extremity Assessment: Overall WFL for tasks assessed Lower Extremity Assessment Lower Extremity Assessment: LLE deficits/detail LLE Deficits / Details: Chronic left knee pain.  Planned left TKA 03/27/13.    Balance    End of Session PT - End of Session Equipment Utilized During Treatment: Gait belt [facility]) Activity Tolerance: Patient tolerated treatment well Patient left: in bed;with call bell/phone within reach Nurse Communication: Mobility status  GP     Axzel Rockhill 03/10/2013, 3:48 PM  Jake Shark, PT DPT [phone]

## 2013-03-10 NOTE — Progress Notes (Signed)
Stroke Team Progress Note  HISTORY Aimee Hood is a 71 y.o. female who was riding in a car earlier at which point she noticed something was wrong and was unable to verbalize it. Her son states that she had repetitive speech perseverating on "I'm okay" and not making any sense. She states that she was unable to understand things or being said to her.  She was evaluated in the emergency room where was found to have both receptive and expressive aphasia and therefore after calling me, decided with the emergency room physician to treat with IV TPA. She is continuously improved and is close, but not completely back to baseline on arrival.  LKW: 11 AM  tpa given?: Yes  NIHSS: 2, aphasia and facial droop   Patient was a TPA candidate. She was admitted to the neuro ICU 3100 for further evaluation and treatment.  SUBJECTIVE   OBJECTIVE Most recent Vital Signs: Filed Vitals:   03/10/13 0600 03/10/13 0700 03/10/13 0800 03/10/13 0900  BP: 142/68 149/118 155/75 151/58  Pulse: 48 52 66 73  Temp:   98.4 F (36.9 C)   TempSrc:   Oral   Resp: 16 17 17 15   Height:      Weight:      SpO2: 99% 99% 99% 100%   CBG (last 3)   Recent Labs  03/09/13 1143  GLUCAP 87    IV Fluid Intake:   . sodium chloride 75 mL/hr at 03/10/13 0800    MEDICATIONS  . pantoprazole (PROTONIX) IV  40 mg Intravenous QHS   PRN:  acetaminophen, acetaminophen, labetalol  Diet:    heart healthy, thin liquids Activity:  Bedrest DVT Prophylaxis:  SCD  CLINICALLY SIGNIFICANT STUDIES Basic Metabolic Panel:  Recent Labs Lab 03/09/13 1155  NA 141  K 3.9  CL 105  CO2 24  GLUCOSE 97  BUN 21  CREATININE 1.00  CALCIUM 9.5   Liver Function Tests:  Recent Labs Lab 03/09/13 1155  AST 20  ALT 18  ALKPHOS 64  BILITOT 0.4  PROT 7.2  ALBUMIN 4.2   CBC:  Recent Labs Lab 03/09/13 1155  WBC 7.2  NEUTROABS 4.6  HGB 12.9  HCT 39.2  MCV 90.5  PLT 282   Coagulation:  Recent Labs Lab 03/09/13 1155   LABPROT 12.5  INR 0.95   Cardiac Enzymes:  Recent Labs Lab 03/09/13 1155  TROPONINI <0.30   Urinalysis:  Recent Labs Lab 03/09/13 1922  COLORURINE YELLOW  LABSPEC 1.011  PHURINE 7.0  GLUCOSEU NEGATIVE  HGBUR NEGATIVE  BILIRUBINUR NEGATIVE  KETONESUR NEGATIVE  PROTEINUR NEGATIVE  UROBILINOGEN 0.2  NITRITE NEGATIVE  LEUKOCYTESUR MODERATE*   Lipid Panel    Component Value Date/Time   CHOL 134 03/10/2013 0422   TRIG 199* 03/10/2013 0422   HDL 37* 03/10/2013 0422   CHOLHDL 3.6 03/10/2013 0422   VLDL 40 03/10/2013 0422   LDLCALC 57 03/10/2013 0422   HgbA1C  No results found for this basename: HGBA1C    Urine Drug Screen:     Component Value Date/Time   LABOPIA NONE DETECTED 03/09/2013 1922   COCAINSCRNUR NONE DETECTED 03/09/2013 1922   LABBENZ NONE DETECTED 03/09/2013 1922   AMPHETMU NONE DETECTED 03/09/2013 1922   THCU NONE DETECTED 03/09/2013 1922   LABBARB NONE DETECTED 03/09/2013 1922    Alcohol Level:  Recent Labs Lab 03/09/13 1155  ETH <11    Ct Head Wo Contrast  03/09/2013   CLINICAL DATA:  Code stroke.  Unable to speak  EXAM: CT HEAD WITHOUT CONTRAST  TECHNIQUE: Contiguous axial images were obtained from the base of the skull through the vertex without intravenous contrast.  COMPARISON:  CT 01/29/2013  FINDINGS: Ventricle size is normal. Negative for acute infarct, hemorrhage, or mass lesion.  Mucosal edema left sphenoid sinus.  No acute bony abnormality.  IMPRESSION: No acute abnormality.  Critical Value/emergent results were called by telephone at the time of interpretation on 03/09/2013 at 11:57 AM to Dr.ROBERT LOCKWOOD , who verbally acknowledged these results.   Electronically Signed   By: Marlan Palau M.D.   On: 03/09/2013 11:58   Dg Chest Port 1 View  03/09/2013   CLINICAL DATA:  History of hypertension now with suspected CVA  EXAM: PORTABLE CHEST - 1 VIEW  COMPARISON:  None.  FINDINGS: The lungs are mildly hypoinflated. There is no focal  infiltrate. The cardiac silhouette is top-normal in size. The pulmonary vascularity is not engorged. There is no pleural effusion or pneumothorax. There is mild tortuosity of the descending thoracic aorta. The trachea is midline. The observed portions of the bony thorax appear normal.  IMPRESSION: There is mild pulmonary hypo inflation. There is no evidence of atelectasis, pneumonia, nor CHF.   Electronically Signed   By: David  [place]   On: 03/09/2013 16:02    CT of the brain   IMPRESSION:  No acute abnormality  MRI of the brain    MRA of the brain    2D Echocardiogram   Study Conclusions  - Left ventricle: The cavity size was normal. Systolic function was normal. The estimated ejection fraction was in the range of 55% to 60%. Wall motion was normal; there were no regional wall motion abnormalities. - Mitral valve: Mild regurgitation. - Left atrium: The atrium was mildly dilated. - Atrial septum: No defect or patent foramen ovale was identified. Impressions:  - No cardiac source of emboli was indentified.   Carotid Doppler    CXR   IMPRESSION:  There is mild pulmonary hypo inflation. There is no evidence of  atelectasis, pneumonia, nor CHF.   EKG    Normal sinus rhythm Low voltage QRS Cannot rule out Anterior infarct , age undetermined ST & T wave abnormality, consider lateral ischemia Abnormal ECG  Therapy Recommendations Pending  Physical Exam  General: The patient is alert and cooperative at the time of the examination. Patient is moderately obese.  Respiratory: Lungs fields are clear.  Cardiovascular: Regular rate and rhythm, no murmurs or rubs are noted.  Abdomen: Moderately obese, positive bowel sounds, nontender.  Skin: No significant peripheral edema is noted.   Neurologic Exam  Mental status: The patient is oriented x 3.  Cranial nerves: Facial symmetry is present. Speech is normal, no aphasia or dysarthria is noted. Extraocular movements are  full. Visual fields are full.  Motor: The patient has good strength in all 4 extremities.  Sensory examination: Soft touch sensation is symmetric on the face, arms and legs.  Coordination: The patient has good finger-nose-finger and heel-to-shin bilaterally.  Gait and station: The gait was not tested No drift is seen.  Reflexes: Deep tendon reflexes are symmetric. Toes are downgoing bilaterally.    ASSESSMENT Ms. Aimee Hood is a 72 y.o. female presenting with an aphasia syndrome, mild right facial droop, headache. The patient was not on antiplatelet agents prior to his vision. The patient received full dose IV TPA, and the aphasia improved significantly. Currently, the patient feels as if she is at baseline.   Hypertension  Obesity  History of cerebral aneurysm  Transient aphasia on admission  Hospital day # 1  TREATMENT/PLAN  Consider adding aspirin after 24 hour time period is up following TPA  Carotid Doppler study  MRI brain  MRA head  Physical and occupational therapy evaluation  Consider transfer to floor later today, mobilize patient  Lesly Dukes  03/10/2013 9:07 AM Beeper #[phone]

## 2013-03-10 NOTE — Progress Notes (Signed)
Bilateral carotid artery duplex:  1-39% ICA stenosis.  Vertebral artery flow is antegrade.     

## 2013-03-11 LAB — COMPREHENSIVE METABOLIC PANEL
ALT: 14 U/L (ref 0–35)
AST: 17 U/L (ref 0–37)
Albumin: 3.9 g/dL (ref 3.5–5.2)
Calcium: 9.5 mg/dL (ref 8.4–10.5)
Creatinine, Ser: 0.84 mg/dL (ref 0.50–1.10)
GFR calc non Af Amer: 69 mL/min — ABNORMAL LOW (ref >90)
Sodium: 137 mEq/L (ref 135–145)

## 2013-03-11 LAB — CBC
Hemoglobin: 13.2 g/dL (ref 12.0–15.0)
MCH: 29.6 pg (ref 26.0–34.0)
MCV: 88.3 fL (ref 78.0–100.0)
Platelets: 277 10*3/uL (ref 150–400)
RDW: 12.9 % (ref 11.5–15.5)
WBC: 6.7 10*3/uL (ref 4.0–10.5)

## 2013-03-11 MED ORDER — CIPROFLOXACIN HCL 250 MG PO TABS: 250.0000 mg | ORAL_TABLET | Freq: Two times a day (BID) | ORAL | Status: DC

## 2013-03-11 MED ORDER — ASPIRIN 325 MG PO TABS: 325.0000 mg | ORAL_TABLET | Freq: Every day | ORAL | Status: DC

## 2013-03-11 MED ORDER — CIPROFLOXACIN HCL 250 MG PO TABS
250.0000 mg | ORAL_TABLET | Freq: Two times a day (BID) | ORAL | Status: DC
  Administered 2013-03-11: 250 mg via ORAL
  Filled 2013-03-11 (×3): qty 1

## 2013-03-11 NOTE — Progress Notes (Addendum)
   CARE MANAGEMENT NOTE 03/11/2013  Patient:  Aimee Hood, Aimee Hood   Account Number:  [id number]  Date Initiated:  03/11/2013  Documentation initiated by:  [facility]  Subjective/Objective Assessment:   adm: Confusion, aphasia     Action/Plan:   discharge planning   Anticipated DC Date:  03/11/2013   Anticipated DC Plan:  HOME/SELF CARE      DC Planning Services  CM consult  OP Neuro Rehab      Choice offered to / List presented to:             Status of service:  Completed, signed off Medicare Important Message given?   (If response is "NO", the following Medicare IM given date fields will be blank) Date Medicare IM given:   Date Additional Medicare IM given:    Discharge Disposition:  HOME/SELF CARE  Per UR Regulation:    If discussed at Long Length of Stay Meetings, dates discussed:    Comments:  03/11/13 14:25  CM brought map and phone number of [facility] to pt in room and explained the referral woiuld be faxed to the Neuro Center today but they woiuld not be open until after 09:00 am 04/12/10.  Pt will call to arrange appt tomorrow after Neuro Center has opened.  Referral for Speech therapy eval and treat was faxed to [facility] per MD order.  No other CM needs were communicated.  Freddy Jaksch, BSN, CM [phone].

## 2013-03-11 NOTE — Discharge Summary (Signed)
Physician Discharge Summary  Patient ID: ISOBEL EISENHUTH MRN: [id number] DOB/AGE: May 20, 1940 72 y.o.  Admit date: 03/09/2013 Discharge date: 03/11/2013  Admission Diagnoses: Confusion, aphasia  Discharge Diagnoses: Left brain stroke, aphasia Active Problems:   Stroke   Discharged Condition: good  Hospital Course: Bama Hanselman is a 72 year old patient who was admitted to the hospital 03/01/2013 with onset of confusion, felt to be related to aphasia. The patient was admitted to the hospital following TPA administration. The patient went to the 3100 care unit, and seemed to improve following the t-PA administration. The patient was noted to have some mild right facial droop as well. MRI of the brain confirms a patchy infarct involving the left middle cerebral artery distribution consistent with an embolic stroke. 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 The patient underwent a carotid Doppler study that was unremarkable, and a 2-D echocardiogram did not show an embolic source of stroke with an ejection fraction of 55-60%. The patient was kept on aspirin 325 mg daily. Physical and occupational therapy saw the patient, and did not believe that there was any ongoing need for therapy following discharge. The patient however, continues to have issues with dysphagia, mainly with difficulty understanding verbal language. The patient has fair repetition, some difficulty with naming. Visual fields appear to be relatively full. The patient is ambulatory.  I have requested a TEE to be done, but this cannot be done until later into the next week. The patient will be discharged to home, and a TEE will be done as an outpatient. A possible CardioNet monitor may need to be done if this study is unremarkable. The patient will followup with [facility] within the next 2 months.  Consults: None  Significant Diagnostic Studies: See  above  Treatments: IV hydration and antibiotics: Cipro  Discharge Exam: Blood pressure 135/71, pulse 70, temperature 97.7 F (36.5 C), temperature source Oral, resp. rate 20, height 5' 6.5" (1.689 m), weight 236 lb 1.8 oz (107.1 kg), SpO2 96.00%.  Physical Exam  General: The patient is alert and cooperative at the time of the examination. Patient is moderately obese.  Respiratory: Lungs fields are clear.  Cardiovascular: Regular rate and rhythm, no murmurs or rubs are noted.  Abdomen: Moderately obese, positive bowel sounds, nontender.  Skin: No significant peripheral edema is noted.  Neurologic Exam  Mental status: The patient is oriented x 3.  Cranial nerves: Facial symmetry is present. Speech is normal, no aphasia or dysarthria is noted. Extraocular movements are full. Visual fields are full.  Motor: The patient has good strength in all 4 extremities.  Sensory examination: Soft touch sensation is symmetric on the face, arms and legs.  Coordination: The patient has good finger-nose-finger and heel-to-shin bilaterally.  Gait and station: The gait was not tested No drift is seen.  Reflexes: Deep tendon reflexes are symmetric. Toes are downgoing    Disposition:  discharge to home today. Followup with [facility] and an appointment for TEE will be made.  Discharge Orders   Future Appointments Provider Department Dept Phone   03/19/2013 8:00 AM [facility] SAME DAY SURGERY [phone]   05/14/2013 9:00 AM Reather Littler, MD [facility] Endocrinology [phone]   Future Orders Complete By Expires   Ambulatory referral to Speech Therapy  As directed    Comments:     Aphasia post stroke, speech and language eval   Diet - low  sodium heart healthy  As directed    Discharge instructions  As directed    Comments:     We will plan TEE as outpatient, the cardiology office should call you about this. Will need to followup with Dr. Anne Hahn  within 2 months following discharge. Our office telephone number is [phone]. I will need to make a work in revisit appointment, may need to leave a message for me to get an appointment.   Increase activity slowly  As directed        Medication List    STOP taking these medications       naproxen sodium 220 MG tablet  Commonly known as:  ANAPROX      TAKE these medications       aspirin 325 MG tablet  Take 1 tablet (325 mg total) by mouth daily.     cholecalciferol 400 UNITS Tabs tablet  Commonly known as:  VITAMIN D  Take 400 Units by mouth daily.     ciprofloxacin 250 MG tablet  Commonly known as:  CIPRO  Take 1 tablet (250 mg total) by mouth 2 (two) times daily.     CITRACAL + D PO  Take by mouth.     clonazePAM 0.5 MG tablet  Commonly known as:  KLONOPIN  Take 1 tablet (0.5 mg total) by mouth 2 (two) times daily as needed for anxiety.     fenofibrate micronized 134 MG capsule  Commonly known as:  LOFIBRA  Take 1 capsule (134 mg total) by mouth daily before breakfast.     omeprazole 20 MG capsule  Commonly known as:  PRILOSEC  Take 1 capsule (20 mg total) by mouth daily.     ramipril 10 MG tablet  Commonly known as:  ALTACE  Take 10 mg by mouth daily.         SignedLesly Dukes 03/11/2013, 11:38 AM

## 2013-03-11 NOTE — Progress Notes (Addendum)
Stroke Team Progress Note  HISTORY Aimee Hood is a 72 y.o. female who was riding in a car on 03/09/2013 at which point she noticed something was wrong and was unable to verbalize it. Her son states that she had repetitive speech perseverating on "I'm okay" and not making any sense. She states that she was unable to understand things being said to her.   She was evaluated in the emergency room where was found to have both receptive and expressive aphasia and therefore after calling Dr. Amada Jupiter, it was decided with the emergency room physician to treat with IV TPA. She continued to improve and was close, but not completely back to baseline on arrival.   LKW: 11 AM  tpa given?: Yes  NIHSS: 2, aphasia and facial droop   Patient was a TPA candidate. She was admitted to the neuro ICU 3100 for further evaluation and treatment.  SUBJECTIVE The patient's son is present this morning. Multiple questions were answered. He is concerned about the patient's handwriting which is not as precise as previously. The patient also asked about knee replacement surgery which has been scheduled for December. I told the patient and her son that it would probably need to be delayed in view of her recent stroke.   OBJECTIVE Most recent Vital Signs: Filed Vitals:   03/10/13 1420 03/10/13 2141 03/11/13 0103 03/11/13 0528  BP: 152/63 153/65 154/72 156/76  Pulse: 65 65 64 58  Temp: 97.6 F (36.4 C) 98.1 F (36.7 C) 98.1 F (36.7 C) 98 F (36.7 C)  TempSrc: Oral Oral Oral Oral  Resp: 18 18 18 18   Height:      Weight:      SpO2: 100% 96% 97% 98%   CBG (last 3)   Recent Labs  03/09/13 1143  GLUCAP 87    IV Fluid Intake:      MEDICATIONS  . aspirin  325 mg Oral Daily  . cholecalciferol  400 Units Oral Daily  . enoxaparin (LOVENOX) injection  40 mg Subcutaneous Q24H  . fenofibrate  160 mg Oral Daily  . pantoprazole  40 mg Oral Daily  . ramipril  10 mg Oral Daily   PRN:  acetaminophen,  acetaminophen, clonazePAM, labetalol  Diet:  Cardiac heart healthy, thin liquids Activity:  Bedrest DVT Prophylaxis:  SCD / Lovenox  CLINICALLY SIGNIFICANT STUDIES Basic Metabolic Panel:   Recent Labs Lab 03/09/13 1155  NA 141  K 3.9  CL 105  CO2 24  GLUCOSE 97  BUN 21  CREATININE 1.00  CALCIUM 9.5   Liver Function Tests:   Recent Labs Lab 03/09/13 1155  AST 20  ALT 18  ALKPHOS 64  BILITOT 0.4  PROT 7.2  ALBUMIN 4.2   CBC:   Recent Labs Lab 03/09/13 1155 03/11/13 0716  WBC 7.2 6.7  NEUTROABS 4.6  --   HGB 12.9 13.2  HCT 39.2 39.4  MCV 90.5 88.3  PLT 282 277   Coagulation:   Recent Labs Lab 03/09/13 1155  LABPROT 12.5  INR 0.95   Cardiac Enzymes:   Recent Labs Lab 03/09/13 1155  TROPONINI <0.30   Urinalysis:   Recent Labs Lab 03/09/13 1922  COLORURINE YELLOW  LABSPEC 1.011  PHURINE 7.0  GLUCOSEU NEGATIVE  HGBUR NEGATIVE  BILIRUBINUR NEGATIVE  KETONESUR NEGATIVE  PROTEINUR NEGATIVE  UROBILINOGEN 0.2  NITRITE NEGATIVE  LEUKOCYTESUR MODERATE*   Lipid Panel    Component Value Date/Time   CHOL 134 03/10/2013 0422   TRIG 199* 03/10/2013 0422  HDL 37* 03/10/2013 0422   CHOLHDL 3.6 03/10/2013 0422   VLDL 40 03/10/2013 0422   LDLCALC 57 03/10/2013 0422   HgbA1C  Lab Results  Component Value Date   HGBA1C 5.5 03/10/2013    Urine Drug Screen:     Component Value Date/Time   LABOPIA NONE DETECTED 03/09/2013 1922   COCAINSCRNUR NONE DETECTED 03/09/2013 1922   LABBENZ NONE DETECTED 03/09/2013 1922   AMPHETMU NONE DETECTED 03/09/2013 1922   THCU NONE DETECTED 03/09/2013 1922   LABBARB NONE DETECTED 03/09/2013 1922    Alcohol Level:   Recent Labs Lab 03/09/13 1155  ETH <11    Ct Head Wo Contrast 03/09/2013    No acute abnormality.     MRI / MRA Brain Wo Contrast 03/10/2013    1. Small patchy mostly cortically based restricted diffusion in the posterior left MCA territory. No associated mass effect or hemorrhage. 2.  No other acute intracranial abnormality, and otherwise negative MRI appearance of the brain except for a small chronic left cerebellar infarct. 3. No left MCA branch stenosis or occlusion identified. 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 There is no evidence of atelectasis, pneumonia, nor CHF.      Mr Maxine Glenn Head/brain Wo Cm 03/10/2013   1. Small patchy mostly cortically based restricted diffusion in the posterior left MCA territory. No associated mass effect or hemorrhage. 2. No other acute intracranial abnormality, and otherwise negative MRI appearance of the brain except for a small chronic left cerebellar infarct. 3. No left MCA branch stenosis or occlusion identified. 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 No cardiac source of emboli was indentified.   Carotid Doppler  Bilateral carotid artery duplex: 1-39% ICA stenosis. Vertebral artery flow is antegrade.    CXR   There is mild pulmonary hypo inflation. There is no evidence of  atelectasis, pneumonia, nor CHF.   EKG    Normal sinus rhythm Low voltage QRS Cannot rule out Anterior infarct , age undetermined ST & T wave abnormality, consider lateral ischemia Abnormal ECG  Therapy Recommendations - no occupational or physical therapy followup recommended.  Physical Exam  General: The patient is alert and cooperative at the time of the examination. Patient is moderately obese.  Respiratory: Lungs fields are clear.  Cardiovascular: Regular rate and rhythm, no murmurs or rubs are noted.  Abdomen: Moderately obese, positive bowel sounds, nontender.  Skin: No significant peripheral edema is noted.   Neurologic Exam  Mental  status: The patient is oriented x 3.  Cranial nerves: Facial symmetry is present. No dysarthria is noted. Extraocular movements are full. Visual fields are full. The patient has difficulty following verbal commands. Naming and repetition are fair.  Motor: The patient has good strength in all 4 extremities.  Sensory examination: Soft touch sensation is symmetric on the face, arms and legs.  Coordination: The patient has good finger-nose-finger and heel-to-shin bilaterally.  Gait and station: The gait was not tested No drift is seen.  Reflexes: Deep tendon reflexes are symmetric. Toes are downgoing bilaterally.    ASSESSMENT Ms. Aimee Hood is a 72 y.o. female presenting with an aphasia syndrome, mild right facial droop, headache. The  patient was not on antiplatelet agents prior to his vision. The patient received full dose IV TPA, and the aphasia improved significantly. Currently, the patient feels as if she is at baseline.   Hypertension  Obesity  History of cerebral aneurysm  aphasia and confusion on admission  Hospital day # 2  TREATMENT/PLAN  Now on aspirin 325 mg daily  No followup physical or occupational therapy is recommended  Mobilize patient  Will probably need to postpone knee replacement surgery scheduled for December.   TEE eval, if this can be done in AM, will keep the patient here, if not, will discharge to home  Out patient ST for residual aphasia   Addendum: TEE cannot be done tomorrow, will discharge patient home. The patient was placed on Cipro for urinary tract infection. If the TEE is unremarkable, may consider CardioNet monitor.   [name] PA-C [facility] Pager [phone] 03/11/2013, 8:10 AM  Aimee Hood

## 2013-03-12 ENCOUNTER — Other Ambulatory Visit: Payer: Self-pay | Admitting: Physician Assistant

## 2013-03-12 ENCOUNTER — Telehealth: Payer: Self-pay | Admitting: Cardiology

## 2013-03-12 DIAGNOSIS — I639 Cerebral infarction, unspecified: Secondary | ICD-10-CM

## 2013-03-12 NOTE — Telephone Encounter (Signed)
Pt scheduled for TEE on 03/14/13 with Dr Anne Fu.  Pre-procedure instructions given to the pt's son Minerva Areola.

## 2013-03-12 NOTE — Telephone Encounter (Signed)
New problem   ----- Message -----  From: Darrol Jump, PA-C  Sent: 03/11/2013 11:23 AM  To: Darrol Jump, PA-C   Needs a TEE for CVA. Can be done as OP. Please schedule and call her   Message was sent from [facility].

## 2013-03-13 ENCOUNTER — Ambulatory Visit: Payer: BC Managed Care – PPO | Attending: Neurology | Admitting: Speech Pathology

## 2013-03-13 ENCOUNTER — Telehealth: Payer: Self-pay | Admitting: *Deleted

## 2013-03-13 ENCOUNTER — Telehealth: Payer: Self-pay | Admitting: Cardiology

## 2013-03-13 DIAGNOSIS — R4701 Aphasia: Secondary | ICD-10-CM | POA: Insufficient documentation

## 2013-03-13 DIAGNOSIS — IMO0001 Reserved for inherently not codable concepts without codable children: Secondary | ICD-10-CM | POA: Insufficient documentation

## 2013-03-13 NOTE — Telephone Encounter (Signed)
Son is aware, he said he doesn't think she's depressed, she just gets emotional.  He declined the zoloft at this time.

## 2013-03-13 NOTE — Telephone Encounter (Signed)
Pt's son called to say his mom had a stroke on Friday, they put her on aspirin 325 mg and cipro for a UTI.  She is understandably emotional and the son wants to know if it's okay for her to take her clonazepam 0.5 mg bid?  She has it already but doesn't take it regularly.  He also wants to know if she needs a f/u with you any time soon since having her stroke?

## 2013-03-13 NOTE — Telephone Encounter (Signed)
The clonazepam is primarily for anxiety, to take as needed only If she is feeling depressed will need to start her on sertraline 50 mg daily She should followup with Dr. Lesia Sago the neurologist who saw her in the hospital

## 2013-03-13 NOTE — Telephone Encounter (Signed)
New problem:  Pt's son is calling in with questions about medications... Wants to know before pts procedure tomorrow.

## 2013-03-13 NOTE — Telephone Encounter (Signed)
Patient's son wanted to know if she could take her Aimee Hood and Cipro prior to TEE. Advised that it is OK to take these medications.

## 2013-03-13 NOTE — Telephone Encounter (Signed)
Dr. Anne Hahn would like to see patient in Jan for f/u. Left msg

## 2013-03-14 ENCOUNTER — Encounter (HOSPITAL_COMMUNITY): Payer: Self-pay

## 2013-03-14 ENCOUNTER — Encounter (HOSPITAL_COMMUNITY)
Admission: RE | Disposition: A | Discharge status: Home / Self Care | Payer: Self-pay | Source: Ambulatory Visit | Attending: Cardiology

## 2013-03-14 ENCOUNTER — Ambulatory Visit (HOSPITAL_COMMUNITY)
Admission: RE | Admit: 2013-03-14 | Discharge: 2013-03-14 | Disposition: A | Discharge status: Home / Self Care | Payer: BC Managed Care – PPO | Source: Ambulatory Visit | Attending: Cardiology | Admitting: Cardiology

## 2013-03-14 ENCOUNTER — Telehealth: Payer: Self-pay | Admitting: Neurology

## 2013-03-14 DIAGNOSIS — Q2111 Secundum atrial septal defect: Secondary | ICD-10-CM | POA: Insufficient documentation

## 2013-03-14 DIAGNOSIS — Z6837 Body mass index (BMI) 37.0-37.9, adult: Secondary | ICD-10-CM | POA: Insufficient documentation

## 2013-03-14 DIAGNOSIS — I639 Cerebral infarction, unspecified: Secondary | ICD-10-CM

## 2013-03-14 DIAGNOSIS — I059 Rheumatic mitral valve disease, unspecified: Secondary | ICD-10-CM | POA: Insufficient documentation

## 2013-03-14 DIAGNOSIS — Q211 Atrial septal defect: Secondary | ICD-10-CM | POA: Insufficient documentation

## 2013-03-14 DIAGNOSIS — E669 Obesity, unspecified: Secondary | ICD-10-CM | POA: Insufficient documentation

## 2013-03-14 DIAGNOSIS — Z7982 Long term (current) use of aspirin: Secondary | ICD-10-CM | POA: Insufficient documentation

## 2013-03-14 DIAGNOSIS — I10 Essential (primary) hypertension: Secondary | ICD-10-CM | POA: Insufficient documentation

## 2013-03-14 DIAGNOSIS — I079 Rheumatic tricuspid valve disease, unspecified: Secondary | ICD-10-CM | POA: Insufficient documentation

## 2013-03-14 DIAGNOSIS — Z8673 Personal history of transient ischemic attack (TIA), and cerebral infarction without residual deficits: Secondary | ICD-10-CM | POA: Diagnosis present

## 2013-03-14 HISTORY — DX: Cerebral infarction, unspecified: I63.9

## 2013-03-14 HISTORY — PX: TEE WITHOUT CARDIOVERSION: SHX5443

## 2013-03-14 SURGERY — ECHOCARDIOGRAM, TRANSESOPHAGEAL
Anesthesia: Moderate Sedation

## 2013-03-14 MED ORDER — FENTANYL CITRATE 0.05 MG/ML IJ SOLN
INTRAMUSCULAR | Status: DC | PRN
  Administered 2013-03-14: 25 ug via INTRAVENOUS

## 2013-03-14 MED ORDER — MIDAZOLAM HCL 10 MG/2ML IJ SOLN
INTRAMUSCULAR | Status: DC | PRN
  Administered 2013-03-14: 2 mg via INTRAVENOUS
  Administered 2013-03-14: 1 mg via INTRAVENOUS

## 2013-03-14 MED ORDER — FENTANYL CITRATE 0.05 MG/ML IJ SOLN
INTRAMUSCULAR | Status: AC
  Filled 2013-03-14: qty 2

## 2013-03-14 MED ORDER — BUTAMBEN-TETRACAINE-BENZOCAINE 2-2-14 % EX AERO
INHALATION_SPRAY | CUTANEOUS | Status: DC | PRN
  Administered 2013-03-14: 2 via TOPICAL

## 2013-03-14 MED ORDER — SODIUM CHLORIDE 0.9 % IV SOLN: INTRAVENOUS | Status: DC

## 2013-03-14 MED ORDER — MIDAZOLAM HCL 5 MG/ML IJ SOLN
INTRAMUSCULAR | Status: AC
  Filled 2013-03-14: qty 2

## 2013-03-14 NOTE — H&P (View-Only) (Signed)
Stroke Team Progress Note  HISTORY Aimee Hood is a 72 y.o. female who was riding in a car on 03/09/2013 at which point she noticed something was wrong and was unable to verbalize it. Her son states that she had repetitive speech perseverating on "I'm okay" and not making any sense. She states that she was unable to understand things being said to her.   She was evaluated in the emergency room where was found to have both receptive and expressive aphasia and therefore after calling Dr. Kirkpatrick, it was decided with the emergency room physician to treat with IV TPA. She continued to improve and was close, but not completely back to baseline on arrival.   LKW: 11 AM  tpa given?: Yes  NIHSS: 2, aphasia and facial droop   Patient was a TPA candidate. She was admitted to the neuro ICU 3100 for further evaluation and treatment.  SUBJECTIVE The patient's son is present this morning. Multiple questions were answered. He is concerned about the patient's handwriting which is not as precise as previously. The patient also asked about knee replacement surgery which has been scheduled for December. I told the patient and her son that it would probably need to be delayed in view of her recent stroke.   OBJECTIVE Most recent Vital Signs: Filed Vitals:   03/10/13 1420 03/10/13 2141 03/11/13 0103 03/11/13 0528  BP: 152/63 153/65 154/72 156/76  Pulse: 65 65 64 58  Temp: 97.6 F (36.4 C) 98.1 F (36.7 C) 98.1 F (36.7 C) 98 F (36.7 C)  TempSrc: Oral Oral Oral Oral  Resp: 18 18 18 18  Height:      Weight:      SpO2: 100% 96% 97% 98%   CBG (last 3)   Recent Labs  03/09/13 1143  GLUCAP 87    IV Fluid Intake:      MEDICATIONS  . aspirin  325 mg Oral Daily  . cholecalciferol  400 Units Oral Daily  . enoxaparin (LOVENOX) injection  40 mg Subcutaneous Q24H  . fenofibrate  160 mg Oral Daily  . pantoprazole  40 mg Oral Daily  . ramipril  10 mg Oral Daily   PRN:  acetaminophen,  acetaminophen, clonazePAM, labetalol  Diet:  Cardiac heart healthy, thin liquids Activity:  Bedrest DVT Prophylaxis:  SCD / Lovenox  CLINICALLY SIGNIFICANT STUDIES Basic Metabolic Panel:   Recent Labs Lab 03/09/13 1155  NA 141  K 3.9  CL 105  CO2 24  GLUCOSE 97  BUN 21  CREATININE 1.00  CALCIUM 9.5   Liver Function Tests:   Recent Labs Lab 03/09/13 1155  AST 20  ALT 18  ALKPHOS 64  BILITOT 0.4  PROT 7.2  ALBUMIN 4.2   CBC:   Recent Labs Lab 03/09/13 1155 03/11/13 0716  WBC 7.2 6.7  NEUTROABS 4.6  --   HGB 12.9 13.2  HCT 39.2 39.4  MCV 90.5 88.3  PLT 282 277   Coagulation:   Recent Labs Lab 03/09/13 1155  LABPROT 12.5  INR 0.95   Cardiac Enzymes:   Recent Labs Lab 03/09/13 1155  TROPONINI <0.30   Urinalysis:   Recent Labs Lab 03/09/13 1922  COLORURINE YELLOW  LABSPEC 1.011  PHURINE 7.0  GLUCOSEU NEGATIVE  HGBUR NEGATIVE  BILIRUBINUR NEGATIVE  KETONESUR NEGATIVE  PROTEINUR NEGATIVE  UROBILINOGEN 0.2  NITRITE NEGATIVE  LEUKOCYTESUR MODERATE*   Lipid Panel    Component Value Date/Time   CHOL 134 03/10/2013 0422   TRIG 199* 03/10/2013 0422     HDL 37* 03/10/2013 0422   CHOLHDL 3.6 03/10/2013 0422   VLDL 40 03/10/2013 0422   LDLCALC 57 03/10/2013 0422   HgbA1C  Lab Results  Component Value Date   HGBA1C 5.5 03/10/2013    Urine Drug Screen:     Component Value Date/Time   LABOPIA NONE DETECTED 03/09/2013 1922   COCAINSCRNUR NONE DETECTED 03/09/2013 1922   LABBENZ NONE DETECTED 03/09/2013 1922   AMPHETMU NONE DETECTED 03/09/2013 1922   THCU NONE DETECTED 03/09/2013 1922   LABBARB NONE DETECTED 03/09/2013 1922    Alcohol Level:   Recent Labs Lab 03/09/13 1155  ETH <11    Ct Head Wo Contrast 03/09/2013    No acute abnormality.     MRI / MRA Brain Wo Contrast 03/10/2013    1. Small patchy mostly cortically based restricted diffusion in the posterior left MCA territory. No associated mass effect or hemorrhage. 2.  No other acute intracranial abnormality, and otherwise negative MRI appearance of the brain except for a small chronic left cerebellar infarct. 3. No left MCA branch stenosis or occlusion identified. 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 There is no evidence of atelectasis, pneumonia, nor CHF.      Mr Mra Head/brain Wo Cm 03/10/2013   1. Small patchy mostly cortically based restricted diffusion in the posterior left MCA territory. No associated mass effect or hemorrhage. 2. No other acute intracranial abnormality, and otherwise negative MRI appearance of the brain except for a small chronic left cerebellar infarct. 3. No left MCA branch stenosis or occlusion identified. 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 No cardiac source of emboli was indentified.   Carotid Doppler  Bilateral carotid artery duplex: 1-39% ICA stenosis. Vertebral artery flow is antegrade.    CXR   There is mild pulmonary hypo inflation. There is no evidence of  atelectasis, pneumonia, nor CHF.   EKG    Normal sinus rhythm Low voltage QRS Cannot rule out Anterior infarct , age undetermined ST & T wave abnormality, consider lateral ischemia Abnormal ECG  Therapy Recommendations - no occupational or physical therapy followup recommended.  Physical Exam  General: The patient is alert and cooperative at the time of the examination. Patient is moderately obese.  Respiratory: Lungs fields are clear.  Cardiovascular: Regular rate and rhythm, no murmurs or rubs are noted.  Abdomen: Moderately obese, positive bowel sounds, nontender.  Skin: No significant peripheral edema is noted.   Neurologic Exam  Mental  status: The patient is oriented x 3.  Cranial nerves: Facial symmetry is present. No dysarthria is noted. Extraocular movements are full. Visual fields are full. The patient has difficulty following verbal commands. Naming and repetition are fair.  Motor: The patient has good strength in all 4 extremities.  Sensory examination: Soft touch sensation is symmetric on the face, arms and legs.  Coordination: The patient has good finger-nose-finger and heel-to-shin bilaterally.  Gait and station: The gait was not tested No drift is seen.  Reflexes: Deep tendon reflexes are symmetric. Toes are downgoing bilaterally.    ASSESSMENT Ms. Aimee Hood is a 72 y.o. female presenting with an aphasia syndrome, mild right facial droop, headache. The   patient was not on antiplatelet agents prior to his vision. The patient received full dose IV TPA, and the aphasia improved significantly. Currently, the patient feels as if she is at baseline.   Hypertension  Obesity  History of cerebral aneurysm  aphasia and confusion on admission  Hospital day # 2  TREATMENT/PLAN  Now on aspirin 325 mg daily  No followup physical or occupational therapy is recommended  Mobilize patient  Will probably need to postpone knee replacement surgery scheduled for December.   TEE eval, if this can be done in AM, will keep the patient here, if not, will discharge to home  Out patient ST for residual aphasia   Addendum: TEE cannot be done tomorrow, will discharge patient home. The patient was placed on Cipro for urinary tract infection. If the TEE is unremarkable, may consider CardioNet monitor.   David Rinehuls PA-C [facility] Pager [phone] 03/11/2013, 8:10 AM  Aimee Hood   

## 2013-03-14 NOTE — Telephone Encounter (Signed)
I called patient and I talked with her son. The patient should have the knee surgery delayed at least 90 days from the time of the stroke. The patient is having some memory issues, probably should not be driving at this point. I'll see the patient back in about 2 months.

## 2013-03-14 NOTE — Progress Notes (Signed)
Echocardiogram Echocardiogram Transesophageal has been performed.  Dorothey Baseman 03/14/2013, 3:27 PM

## 2013-03-14 NOTE — Telephone Encounter (Signed)
Please advise 

## 2013-03-14 NOTE — CV Procedure (Addendum)
    TEE  Indication: stroke  Normal EF No masses Mild MR Mild TR Mildly positive bubble study with few bubble contrast cross over from right to left indicative of very small PFO (patent foramen ovale)  Please see formal report.

## 2013-03-14 NOTE — Interval H&P Note (Signed)
History and Physical Interval Note:  03/14/2013 2:02 PM  Aimee Hood  has presented today for surgery, with the diagnosis of STROKE  The various methods of treatment have been discussed with the patient and family. After consideration of risks, benefits and other options for treatment, the patient has consented to  Procedure(s): TRANSESOPHAGEAL ECHOCARDIOGRAM (TEE) (N/A) as a surgical intervention .  The patient's history has been reviewed, patient examined, no change in status, stable for surgery.  I have reviewed the patient's chart and labs.  Questions were answered to the patient's satisfaction.     Montay Vanvoorhis

## 2013-03-15 ENCOUNTER — Encounter (HOSPITAL_COMMUNITY): Payer: Self-pay | Admitting: Cardiology

## 2013-03-15 ENCOUNTER — Ambulatory Visit: Payer: BC Managed Care – PPO | Admitting: Speech Pathology

## 2013-03-19 ENCOUNTER — Other Ambulatory Visit (HOSPITAL_COMMUNITY): Payer: BC Managed Care – PPO

## 2013-03-20 ENCOUNTER — Ambulatory Visit: Payer: BC Managed Care – PPO

## 2013-03-21 ENCOUNTER — Telehealth: Payer: Self-pay | Admitting: Neurology

## 2013-03-21 NOTE — Telephone Encounter (Signed)
I called patient. The patient is being considered for a knee brace or an injection in the knee for arthritis. There are no neurologic contraindications to this. I will Fax this note to Dr. Cleophas Dunker.

## 2013-03-21 NOTE — Telephone Encounter (Signed)
PLEASE CALL DR. Cleophas Dunker @ [facility] TO GIVE THEM AN ORDER FOR A KNEE BRACE FOR PATIENT

## 2013-03-22 ENCOUNTER — Ambulatory Visit: Payer: BC Managed Care – PPO | Admitting: Speech Pathology

## 2013-03-26 ENCOUNTER — Ambulatory Visit: Payer: BC Managed Care – PPO

## 2013-03-27 ENCOUNTER — Encounter (HOSPITAL_COMMUNITY): Admission: RE | Payer: Self-pay | Source: Ambulatory Visit

## 2013-03-27 ENCOUNTER — Inpatient Hospital Stay (HOSPITAL_COMMUNITY)
Admission: RE | Admit: 2013-03-27 | Payer: BC Managed Care – PPO | Source: Ambulatory Visit | Admitting: Orthopaedic Surgery

## 2013-03-27 SURGERY — ARTHROPLASTY, KNEE, TOTAL
Anesthesia: General | Site: Knee | Laterality: Left

## 2013-03-29 ENCOUNTER — Ambulatory Visit: Payer: BC Managed Care – PPO | Admitting: Speech Pathology

## 2013-04-02 ENCOUNTER — Ambulatory Visit: Payer: BC Managed Care – PPO

## 2013-04-03 ENCOUNTER — Ambulatory Visit: Payer: BC Managed Care – PPO | Admitting: Speech Pathology

## 2013-04-23 ENCOUNTER — Encounter (INDEPENDENT_AMBULATORY_CARE_PROVIDER_SITE_OTHER): Payer: Self-pay

## 2013-04-23 ENCOUNTER — Ambulatory Visit (INDEPENDENT_AMBULATORY_CARE_PROVIDER_SITE_OTHER): Payer: BC Managed Care – PPO | Admitting: Neurology

## 2013-04-23 ENCOUNTER — Encounter: Payer: Self-pay | Admitting: Neurology

## 2013-04-23 VITALS — BP 143/86 | HR 67 | Wt 226.0 lb

## 2013-04-23 DIAGNOSIS — I671 Cerebral aneurysm, nonruptured: Secondary | ICD-10-CM

## 2013-04-23 DIAGNOSIS — I6350 Cerebral infarction due to unspecified occlusion or stenosis of unspecified cerebral artery: Secondary | ICD-10-CM

## 2013-04-23 DIAGNOSIS — I639 Cerebral infarction, unspecified: Secondary | ICD-10-CM

## 2013-04-23 DIAGNOSIS — R42 Dizziness and giddiness: Secondary | ICD-10-CM

## 2013-04-23 DIAGNOSIS — I6340 Cerebral infarction due to embolism of unspecified cerebral artery: Secondary | ICD-10-CM

## 2013-04-23 NOTE — Progress Notes (Signed)
Reason for visit: Stroke  Aimee Hood is an 73 y.o. 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 The patient presented with an aphasia syndrome. 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 The patient underwent a 2-D echocardiogram showing ejection fraction of 55-60%, otherwise unremarkable. A carotid Doppler study was unremarkable. The patient underwent an outpatient transesophageal echocardiogram showing a very small PFO, and an atrioseptal aneurysm. The patient otherwise had no cardiac abnormalities. The patient has been treated with aspirin. The patient has had some residual problems with vision, and she reports some decreased hearing on the left. The patient denies any problems with speech at this point. The patient reports no significant issues with numbness or weakness of the face, arms, or legs. The patient denies problems controlling the bowels or the bladder. The patient is not having problems with swallowing. The patient has significant problems with left knee arthritis, and she is considering a total knee replacement. The patient wants to know when this surgical procedure can be done.  Past Medical History  Diagnosis Date  . Arthritis   . Diverticulitis   . Carpal tunnel syndrome of left wrist   . Colon polyp 2007  . Allergy   . Cerebral aneurysm, nonruptured     2-3 mm in size, stable  . Stroke   . Insomnia   . Gastroesophageal reflux disease   . Restless leg syndrome     Past Surgical History  Procedure Laterality Date  . Cholecystectomy    . Breast surgery    . Tee without cardioversion N/A 03/14/2013    Procedure:  TRANSESOPHAGEAL ECHOCARDIOGRAM (TEE);  Surgeon: Candee Furbish, MD;  Location: [facility] ENDOSCOPY;  Service: Cardiovascular;  Laterality: N/A;    Family History  Problem Relation Age of Onset  . Heart disease Brother 39  . Cancer Brother     lung  . Diabetes Paternal Aunt   . Cancer Maternal Grandmother   . Cancer Father     lung  . Cancer Sister     Social history:  reports that she has never smoked. She does not have any smokeless tobacco history on file. She reports that she does not drink alcohol. Her drug history is not on file.    Allergies  Allergen Reactions  . Codeine Rash    Medications:  Current Outpatient Prescriptions on File Prior to Visit  Medication Sig Dispense Refill  . aspirin 325 MG tablet Take 1 tablet (325 mg total) by mouth daily.      . Calcium Citrate-Vitamin D (CITRACAL + D PO) Take by mouth.      . cholecalciferol (VITAMIN D) 400 UNITS TABS tablet Take 400 Units by mouth daily.       . clonazePAM (KLONOPIN) 0.5 MG tablet Take 1 tablet (0.5 mg total) by mouth 2 (two) times daily as needed for anxiety.  90 tablet  1  . fenofibrate micronized (LOFIBRA) 134 MG capsule Take 1 capsule (134 mg total) by mouth daily before breakfast.  30 capsule  4  . omeprazole (PRILOSEC) 20 MG capsule Take 1 capsule (20 mg total) by mouth daily.  90 capsule  3  . ramipril (  ALTACE) 10 MG tablet Take 10 mg by mouth daily.       No current facility-administered medications on file prior to visit.    ROS:  Out of a complete 14 system review of symptoms, the patient complains only of the following symptoms, and all other reviewed systems are negative.  Arthritis History of stroke  Blood pressure 143/86, pulse 67, weight 226 lb (102.513 kg).  Physical Exam  General: The patient is alert and cooperative at the time of the examination. The patient is moderately obese.  Skin: No significant peripheral edema is noted.   Neurologic Exam  Mental status: The patient is oriented x  3.  Cranial nerves: Facial symmetry is present. Speech is normal, no aphasia or dysarthria is noted. Extraocular movements are full. Visual fields are full.  Motor: The patient has good strength in all 4 extremities.  Sensory examination: Soft a sensation on the face, arms, and legs are symmetric.  Coordination: The patient has good finger-nose-finger and heel-to-shin bilaterally.  Gait and station: The patient has a limping gait on the left leg. The patient uses a cane for ambulation. Tandem gait is slightly unsteady. Romberg is negative. No drift is seen.  Reflexes: Deep tendon reflexes are symmetric.   MRI brain, MRA head 03/09/2013:  IMPRESSION:  1. Small patchy mostly cortically based restricted diffusion in the  posterior left MCA territory. No associated mass effect or  hemorrhage.  2. No other acute intracranial abnormality, and otherwise negative  MRI appearance of the brain except for a small chronic left  cerebellar infarct.  3. No left MCA branch stenosis or occlusion identified. Stable  intracranial vessels, including a chronic 3-4 mm left ACA  pericallosal aneurysm     Assessment/Plan:  One. Left middle cerebral artery distribution stroke  2. Left anterior cerebral artery distribution aneurysm, 3 mm  3. Degenerative arthritis, left knee arthritis  The patient wants to have a total knee replacement as soon as possible on the left knee. I have indicated that I would wait at least 90 days from the time of the stroke. The patient will be setup for a prolonged cardiac monitor for 3 weeks to exclude the possibility of atrial fibrillation as an etiology for her stroke. The patient will remain on aspirin otherwise. The patient will followup through this office if needed. The patient does well, she may have surgery in early March of 2014.  Aimee Alexanders MD 04/23/2013 7:53 PM  [facility] [address] [place], [place]  [address]  Phone [phone] Fax [phone]

## 2013-04-23 NOTE — Patient Instructions (Signed)

## 2013-04-24 NOTE — Addendum Note (Signed)
Addended by: Lolita Cram T on: 04/24/2013 02:52 PM   Modules accepted: Orders

## 2013-04-30 ENCOUNTER — Encounter: Payer: Self-pay | Admitting: *Deleted

## 2013-04-30 ENCOUNTER — Encounter (INDEPENDENT_AMBULATORY_CARE_PROVIDER_SITE_OTHER): Payer: BC Managed Care – PPO

## 2013-04-30 DIAGNOSIS — I6340 Cerebral infarction due to embolism of unspecified cerebral artery: Secondary | ICD-10-CM

## 2013-04-30 DIAGNOSIS — R42 Dizziness and giddiness: Secondary | ICD-10-CM

## 2013-04-30 NOTE — Progress Notes (Signed)
Patient ID: Aimee Hood, female   DOB: 1941-03-23, 73 y.o.   MRN: [id number] [employer] 21 day cardiac event monitor applied to patient.

## 2013-05-14 ENCOUNTER — Ambulatory Visit (INDEPENDENT_AMBULATORY_CARE_PROVIDER_SITE_OTHER): Payer: BC Managed Care – PPO | Admitting: Endocrinology

## 2013-05-21 ENCOUNTER — Telehealth: Payer: Self-pay | Admitting: Neurology

## 2013-05-24 NOTE — Telephone Encounter (Signed)
I called patient. The patient had a monitor taken off on February 9, she noted in. It likely has not been read yet, listed as in progress in the computer. I'll have the patient call me around February 18 if she has not heard anything yet.

## 2013-05-24 NOTE — Telephone Encounter (Signed)
Patient calling about the heart monitor that she had removed, wants to know the results so she can go ahead with surgery scheduling. Please call and advise.

## 2013-05-24 NOTE — Telephone Encounter (Signed)
Patient calling because she has questions concerning the heart monitor that she has removed and wants to know the results. Please advise.

## 2013-05-28 ENCOUNTER — Other Ambulatory Visit: Payer: Self-pay | Admitting: *Deleted

## 2013-05-28 ENCOUNTER — Encounter: Payer: Self-pay | Admitting: Endocrinology

## 2013-05-28 ENCOUNTER — Ambulatory Visit (INDEPENDENT_AMBULATORY_CARE_PROVIDER_SITE_OTHER): Payer: BC Managed Care – PPO | Admitting: Endocrinology

## 2013-05-28 VITALS — BP 118/72 | HR 56 | Temp 98.3°F | Resp 14 | Ht 66.5 in | Wt 227.0 lb

## 2013-05-28 DIAGNOSIS — I10 Essential (primary) hypertension: Secondary | ICD-10-CM

## 2013-05-28 DIAGNOSIS — E785 Hyperlipidemia, unspecified: Secondary | ICD-10-CM

## 2013-05-28 NOTE — Progress Notes (Signed)
Patient ID: Aimee Hood, female   DOB: 03-21-1941, 73 y.o.     History of Present Illness:  Hypertension:   Has been on treatment with Ramipril for hypertension since about 2003. Blood pressure has usually been very well controlled.   Occasionally it may be higher if she is under more stress  Home blood pressure checks: has been checking regularly with the [employer] brand monitor and readings are 010-932 systolic. Because of relatively lower blood pressure reading and high normal creatinine in 8/14 her ramipril was reduced to 5 mg.  Hyperlipidemia:  Lipid abnormalities Have been high triglycerides, highest level of 426 in 2007 and also high LDL, up to 156. With treatment her triglycerides have been as low as 57 and LDL down to 31.  When fenofibrate was stopped her triglycerides started increasing along with LDL and this was restarted. She has done well with her diet  but has not been exercising   Lab Results  Component Value Date   CHOL 134 03/10/2013   HDL 37* 03/10/2013   LDLCALC 57 03/10/2013   TRIG 199* 03/10/2013   CHOLHDL 3.6 03/10/2013     CVA: She had what possibly was an embolic CVA causing speech difficulties but no source of embolism was done. She is now getting a three-week m Holter  monitor from neurologist to rule out atrial fibrillation   Past Medical History  Diagnosis Date  . Arthritis   . Diverticulitis   . Carpal tunnel syndrome of left wrist   . Colon polyp 2007  . Allergy   . Cerebral aneurysm, nonruptured     2-3 mm in size, stable  . Stroke   . Insomnia   . Gastroesophageal reflux disease   . Restless leg syndrome     Past Surgical History  Procedure Laterality Date  . Cholecystectomy    . Breast surgery    . Tee without cardioversion N/A 03/14/2013    Procedure: TRANSESOPHAGEAL ECHOCARDIOGRAM (TEE);  Surgeon: Candee Furbish, MD;  Location: [facility] ENDOSCOPY;  Service: Cardiovascular;  Laterality: N/A;    Family History  Problem Relation Age of Onset  .  Heart disease Brother 72  . Cancer Brother     lung  . Diabetes Paternal Aunt   . Cancer Maternal Grandmother   . Cancer Father     lung  . Cancer Sister     Social History:  reports that she has never smoked. She does not have any smokeless tobacco history on file. She reports that she does not drink alcohol. Her drug history is not on file.  Allergies:  Allergies  Allergen Reactions  . Codeine Rash      Medication List       This list is accurate as of: 05/28/13  8:14 AM.  Always use your most recent med list.               aspirin 325 MG tablet  Take 1 tablet (325 mg total) by mouth daily.     cholecalciferol 400 UNITS Tabs tablet  Commonly known as:  VITAMIN D  Take 400 Units by mouth daily.     CITRACAL + D PO  Take by mouth.     clonazePAM 0.5 MG tablet  Commonly known as:  KLONOPIN  Take 1 tablet (0.5 mg total) by mouth 2 (two) times daily as needed for anxiety.     fenofibrate micronized 134 MG capsule  Commonly known as:  LOFIBRA  Take 1 capsule (134 mg total)  by mouth daily before breakfast.     omeprazole 20 MG capsule  Commonly known as:  PRILOSEC  Take 1 capsule (20 mg total) by mouth daily.     ramipril 10 MG tablet  Commonly known as:  ALTACE  Take 10 mg by mouth daily.         REVIEW OF SYSTEMS:       She has osteoarthritis of her knees and is waiting to get her knee replacement done after clearance from neurologist  PHYSICAL EXAM:  BP 110/60  Pulse 56  Temp(Src) 98.3 F (36.8 C)  Resp 14  Ht 5' 6.5" (1.689 m)  Wt 227 lb (102.967 kg)  BMI 36.09 kg/m2  SpO2 96%  Standing BP 118/72   HEART:  Normal , S1 and S2; no murmur or click.   ASSESSMENT/PLAN :   HYPERTENSION: Blood pressure today is relatively low and not clear of her home monitor is accurate as it is reading higher Will reduce her ramipril down to 2.5 mg She will use her [employer] monitor to measure her blood pressure She will return in 4 weeks for followup and  repeat renal function  HYPERLIPIDEMIA: Will need followup levels on the next visit, should have improvement in HDL and triglycerides with weight loss  Osteoarthritis knees: She is awaiting knee replacement   Devynn Hessler 05/28/2013, 8:14 AM

## 2013-05-28 NOTE — Patient Instructions (Addendum)
Ramipril 2.5mg  daily  Use [employer] BP cuff and bring it to office

## 2013-05-30 ENCOUNTER — Telehealth: Payer: Self-pay | Admitting: Neurology

## 2013-05-30 NOTE — Telephone Encounter (Signed)
Received fax of event monitor from [facility], gave to Dr Tobey Grim for review

## 2013-05-30 NOTE — Telephone Encounter (Signed)
I called patient. The cardiac event monitor was unremarkable, only occasional PVCs were seen. The patient is cleared to have knee surgery in early March of 2015. I'll send this note to Dr. Durward Fortes.

## 2013-05-30 NOTE — Telephone Encounter (Signed)
Called patient and will call [name] for results

## 2013-05-30 NOTE — Telephone Encounter (Signed)
Called and will have Dr Marlou Porch office to fax report for Dr Jannifer Franklin to review

## 2013-05-30 NOTE — Telephone Encounter (Signed)
Patient called to state that her monitor was removed a little over a week ago and she was instructed by Dr. Jannifer Franklin to call today if she hadn't heard anything back about it yet. Please call patient and advise.

## 2013-06-04 ENCOUNTER — Other Ambulatory Visit: Payer: Self-pay | Admitting: *Deleted

## 2013-06-04 ENCOUNTER — Telehealth: Payer: Self-pay | Admitting: Neurology

## 2013-06-04 MED ORDER — RAMIPRIL 2.5 MG PO CAPS: 2.5000 mg | ORAL_CAPSULE | Freq: Every day | ORAL | Status: DC

## 2013-06-04 NOTE — Telephone Encounter (Signed)
Patient has not heard back from her orthopedic surgeon and is questioning whether [facility] had done a referral yet. Please call to advise.

## 2013-06-04 NOTE — Telephone Encounter (Signed)
I called the patient. I sent a note to Dr. Durward Fortes last week. The patient should be up to have knee surgery sometime within the next several weeks. I'll also send him a letter.

## 2013-06-14 ENCOUNTER — Telehealth: Payer: Self-pay | Admitting: Cardiology

## 2013-06-14 NOTE — Telephone Encounter (Signed)
New message    Fax request sent on  2/25 , patient is waiting to R./S her surgery . Please advise.

## 2013-06-15 NOTE — Telephone Encounter (Signed)
Spoke with patient advised i would look to see if we have the surgical clearance request and get back with her.

## 2013-06-18 NOTE — Telephone Encounter (Signed)
Spoke with patient son advised that we have clearance request, will have Skains sign and fax.

## 2013-06-19 ENCOUNTER — Other Ambulatory Visit (INDEPENDENT_AMBULATORY_CARE_PROVIDER_SITE_OTHER): Payer: BC Managed Care – PPO

## 2013-06-19 DIAGNOSIS — I10 Essential (primary) hypertension: Secondary | ICD-10-CM

## 2013-06-19 DIAGNOSIS — E785 Hyperlipidemia, unspecified: Secondary | ICD-10-CM

## 2013-06-19 LAB — LIPID PANEL
CHOL/HDL RATIO: 3
Cholesterol: 133 mg/dL (ref 0–200)
HDL: 47.6 mg/dL (ref >39.00)
LDL CALC: 62 mg/dL (ref 0–99)
Triglycerides: 118 mg/dL (ref 0.0–149.0)
VLDL: 23.6 mg/dL (ref 0.0–40.0)

## 2013-06-19 LAB — COMPREHENSIVE METABOLIC PANEL
ALK PHOS: 63 U/L (ref 39–117)
ALT: 20 U/L (ref 0–35)
AST: 25 U/L (ref 0–37)
Albumin: 4.2 g/dL (ref 3.5–5.2)
BUN: 18 mg/dL (ref 6–23)
CHLORIDE: 110 meq/L (ref 96–112)
CO2: 28 mEq/L (ref 19–32)
CREATININE: 1 mg/dL (ref 0.4–1.2)
Calcium: 9.7 mg/dL (ref 8.4–10.5)
GFR: 60.94 mL/min (ref >60.00)
Glucose, Bld: 88 mg/dL (ref 70–99)
Potassium: 4.4 mEq/L (ref 3.5–5.1)
Sodium: 143 mEq/L (ref 135–145)
Total Bilirubin: 0.6 mg/dL (ref 0.3–1.2)
Total Protein: 7.3 g/dL (ref 6.0–8.3)

## 2013-06-20 ENCOUNTER — Telehealth: Payer: Self-pay | Admitting: Cardiology

## 2013-06-20 NOTE — Telephone Encounter (Signed)
Spoke with Maudie Mercury, surgical clearance was faxed and received to fax # [phone].

## 2013-06-20 NOTE — Telephone Encounter (Signed)
New message     Office calling back to checking on status of clearance. Office stating patient in a lot of pain. Fax request sent on 2/25.

## 2013-06-22 NOTE — Telephone Encounter (Deleted)
Lm on Kim'

## 2013-06-22 NOTE — Telephone Encounter (Addendum)
Lm on Aimee Hood's vm at [place] her know clearance was faxed on 06/20/13 and if she did not receive clearance to call us back.

## 2013-06-25 ENCOUNTER — Telehealth: Payer: Self-pay | Admitting: Cardiology

## 2013-06-25 ENCOUNTER — Encounter (HOSPITAL_COMMUNITY): Payer: Self-pay | Admitting: Pharmacy Technician

## 2013-06-25 NOTE — Telephone Encounter (Signed)
Received request from Nurse fax box, documents faxed for surgical clearance. To: [employer] number: [phone] Attention: [phone]/k.[place]

## 2013-06-25 NOTE — Telephone Encounter (Signed)
Pre-operative clearance fax.

## 2013-06-26 ENCOUNTER — Encounter: Payer: Self-pay | Admitting: Endocrinology

## 2013-06-26 ENCOUNTER — Ambulatory Visit (INDEPENDENT_AMBULATORY_CARE_PROVIDER_SITE_OTHER): Payer: BC Managed Care – PPO | Admitting: Endocrinology

## 2013-06-26 ENCOUNTER — Other Ambulatory Visit: Payer: Self-pay | Admitting: Endocrinology

## 2013-06-26 VITALS — BP 128/72 | HR 59 | Temp 98.0°F | Resp 16 | Ht 66.5 in | Wt 226.8 lb

## 2013-06-26 DIAGNOSIS — R300 Dysuria: Secondary | ICD-10-CM

## 2013-06-26 DIAGNOSIS — I10 Essential (primary) hypertension: Secondary | ICD-10-CM

## 2013-06-26 DIAGNOSIS — M1990 Unspecified osteoarthritis, unspecified site: Secondary | ICD-10-CM

## 2013-06-26 DIAGNOSIS — E785 Hyperlipidemia, unspecified: Secondary | ICD-10-CM

## 2013-06-26 DIAGNOSIS — K219 Gastro-esophageal reflux disease without esophagitis: Secondary | ICD-10-CM

## 2013-06-26 NOTE — Progress Notes (Signed)
Patient ID: Aimee Hood, female   DOB: Jul 24, 1940, 73 y.o.     History of Present Illness:  Hypertension:   Has been on treatment with Ramipril for hypertension since about 2003. Blood pressure has usually been very well controlled.   Occasionally it may be higher if she is under more stress  Previously had been taking 10 mg ramipril Home blood pressure checks: has been checking regularly with the [employer] brand monitor and readings are recently about 283-151 systolic. Because of relatively lower blood pressure reading on her last visit her ramipril was reduced to 2.5 mg  Lab Results  Component Value Date   CREATININE 1.0 06/19/2013    Hyperlipidemia:  Lipid abnormalities Have been high triglycerides, highest level of 426 in 2007 and also high LDL, up to 156. With treatment her triglycerides have been as low as 57 and LDL down to 31.  When fenofibrate was stopped her triglycerides started increasing along with LDL and this was restarted. She has done well with her diet  but has not been able to exercise or lose weight Lipid panel is excellent now    Lab Results  Component Value Date   CHOL 133 06/19/2013   HDL 47.60 06/19/2013   LDLCALC 62 06/19/2013   TRIG 118.0 06/19/2013   CHOLHDL 3 06/19/2013       Dysuria: Mild symptoms for 1 day along with some frequent urination   Past Medical History  Diagnosis Date  . Arthritis   . Diverticulitis   . Carpal tunnel syndrome of left wrist   . Colon polyp 2007  . Allergy   . Cerebral aneurysm, nonruptured     2-3 mm in size, stable  . Stroke   . Insomnia   . Gastroesophageal reflux disease   . Restless leg syndrome     Past Surgical History  Procedure Laterality Date  . Cholecystectomy    . Breast surgery    . Tee without cardioversion N/A 03/14/2013    Procedure: TRANSESOPHAGEAL ECHOCARDIOGRAM (TEE);  Surgeon: Candee Furbish, MD;  Location: [facility] ENDOSCOPY;  Service: Cardiovascular;  Laterality: N/A;    Family History  Problem  Relation Age of Onset  . Heart disease Brother 64  . Cancer Brother     lung  . Diabetes Paternal Aunt   . Cancer Maternal Grandmother   . Cancer Father     lung  . Cancer Sister     Social History:  reports that she has never smoked. She does not have any smokeless tobacco history on file. She reports that she does not drink alcohol. Her drug history is not on file.  Allergies:  Allergies  Allergen Reactions  . Codeine Rash      Medication List       This list is accurate as of: 06/26/13  9:10 AM.  Always use your most recent med list.               acetaminophen 500 MG tablet  Commonly known as:  TYLENOL  Take 1,000 mg by mouth every 6 (six) hours as needed for mild pain.     aspirin 325 MG tablet  Take 1 tablet (325 mg total) by mouth daily.     cholecalciferol 1000 UNITS tablet  Commonly known as:  VITAMIN D  Take 2,000 Units by mouth daily.     clonazePAM 0.5 MG tablet  Commonly known as:  KLONOPIN  Take 1 tablet (0.5 mg total) by mouth 2 (two) times daily as  needed for anxiety.     fenofibrate micronized 134 MG capsule  Commonly known as:  LOFIBRA  Take 1 capsule (134 mg total) by mouth daily before breakfast.     FIBER THERAPY PO  Take 2 tablets by mouth every morning.     omeprazole 20 MG capsule  Commonly known as:  PRILOSEC  Take 1 capsule (20 mg total) by mouth daily.     ramipril 2.5 MG capsule  Commonly known as:  ALTACE  Take 1 capsule (2.5 mg total) by mouth daily.         REVIEW OF SYSTEMS:       CVA: She had a possible embolic CVA causing speech difficulties but no source of embolism was done.  Had a negative event monitor  She has osteoarthritis of her knees and is waiting to get her knee replacement in 2 weeks   PHYSICAL EXAM:  BP 128/72  Pulse 59  Temp(Src) 98 F (36.7 C)  Resp 16  Ht 5' 6.5" (1.689 m)  Wt 226 lb 12.8 oz (102.876 kg)  BMI 36.06 kg/m2  SpO2 97%    ASSESSMENT/PLAN :   HYPERTENSION: Blood pressure  today is excellent and well controlled with only low-dose ramipril Her home monitor is also more accurate with the [employer] brand Will continue her ramipril 2.5 mg She will return in 4 months for physical  HYPERLIPIDEMIA: Excellent control on fenofibrate  Dysuria: Will check urinalysis  Osteoarthritis knees: She is awaiting knee replacement   Archibald Marchetta 06/26/2013, 9:10 AM

## 2013-06-27 ENCOUNTER — Other Ambulatory Visit: Payer: Self-pay | Admitting: *Deleted

## 2013-06-27 LAB — URINALYSIS, MICROSCOPIC ONLY
Bacteria, UA: NONE SEEN
CRYSTALS: NONE SEEN
Casts: NONE SEEN
Squamous Epithelial / LPF: NONE SEEN

## 2013-06-27 LAB — URINALYSIS, ROUTINE W REFLEX MICROSCOPIC
BILIRUBIN URINE: NEGATIVE
Glucose, UA: NEGATIVE mg/dL
Hgb urine dipstick: NEGATIVE
Ketones, ur: NEGATIVE mg/dL
NITRITE: POSITIVE — AB
Protein, ur: NEGATIVE mg/dL
SPECIFIC GRAVITY, URINE: 1.01 (ref 1.005–1.030)
UROBILINOGEN UA: 0.2 mg/dL (ref 0.0–1.0)
pH: 6 (ref 5.0–8.0)

## 2013-06-27 MED ORDER — SULFAMETHOXAZOLE-TMP DS 800-160 MG PO TABS: 1.0000 | ORAL_TABLET | Freq: Two times a day (BID) | ORAL | Status: DC

## 2013-06-30 NOTE — Pre-Procedure Instructions (Signed)
[facility] - Preparing for Surgery  Before surgery, you can play an important role.  Because skin is not sterile, your skin needs to be as free of germs as possible.  You can reduce the number of germs on you skin by washing with CHG (chlorahexidine gluconate) soap before surgery.  CHG is an antiseptic cleaner which kills germs and bonds with the skin to continue killing germs even after washing.  Please DO NOT use if you have an allergy to CHG or antibacterial soaps.  If your skin becomes reddened/irritated stop using the CHG and inform your nurse when you arrive at Short Stay.  Do not shave (including legs and underarms) for at least 48 hours prior to the first CHG shower.  You may shave your face.  Please follow these instructions carefully:   1.  Shower with CHG Soap the night before surgery and the morning of Surgery.  2.  If you choose to wash your hair, wash your hair first as usual with your normal shampoo.  3.  After you shampoo, rinse your hair and body thoroughly to remove the shampoo.  4.  Use CHG as you would any other liquid soap.  You can apply CHG directly to the skin and wash gently with scrungie or a clean washcloth.  5.  Apply the CHG Soap to your body ONLY FROM THE NECK DOWN.  Do not use on open wounds or open sores.  Avoid contact with your eyes, ears, mouth and genitals (private parts).  Wash genitals (private parts) with your normal soap.  6.  Wash thoroughly, paying special attention to the area where your surgery will be performed.  7.  Thoroughly rinse your body with warm water from the neck down.  8.  DO NOT shower/wash with your normal soap after using and rinsing off the CHG Soap.  9.  Pat yourself dry with a clean towel.            10.  Wear clean pajamas.            11.  Place clean sheets on your bed the night of your first shower and do not sleep with pets.  Day of Surgery  Do not apply any lotions the morning of surgery.  Please wear clean clothes to the  hospital/surgery center.   

## 2013-06-30 NOTE — Pre-Procedure Instructions (Signed)
Aimee Hood  06/30/2013   Your procedure is scheduled on:  March 31  Report to [facility] Entrance "A" [address] at [employer] AM.  Call this number if you have problems the morning of surgery: [phone]   Remember:   Do not eat food or drink liquids after midnight.   Take these medicines the morning of surgery with A SIP OF WATER: Tylenol (if needed), Klonopin (if needed), Omeprazole, Bactrim DS (if not completed)    STOP Vitamin D and Aspirin today   STOP/ Do not take Aspirin, Aleve, Naproxen, Advil, Ibuprofen, Vitamin, Herbs, or Supplements starting today   Do not wear jewelry, make-up or nail polish.  Do not wear lotions, powders, or perfumes. You may not wear deodorant.  Do not shave 48 hours prior to surgery. Men may shave face and neck.  Do not bring valuables to the hospital.  [facility] is not responsible for any belongings or valuables.               Contacts, dentures or bridgework may not be worn into surgery.  Leave suitcase in the car. After surgery it may be brought to your room.  For patients admitted to the hospital, discharge time is determined by your treatment team.                 Special Instructions: See [facility] Preparing For Surgery   Please read over the following fact sheets that you were given: Pain Booklet, Coughing and Deep Breathing, Blood Transfusion Information and Surgical Site Infection Prevention

## 2013-07-02 ENCOUNTER — Encounter (HOSPITAL_COMMUNITY): Payer: Self-pay

## 2013-07-02 ENCOUNTER — Encounter (HOSPITAL_COMMUNITY)
Admission: RE | Admit: 2013-07-02 | Discharge: 2013-07-02 | Disposition: A | Discharge status: Home / Self Care | Payer: BC Managed Care – PPO | Source: Ambulatory Visit | Attending: Orthopaedic Surgery | Admitting: Orthopaedic Surgery

## 2013-07-02 ENCOUNTER — Encounter (HOSPITAL_COMMUNITY)
Admission: RE | Admit: 2013-07-02 | Discharge: 2013-07-02 | Disposition: A | Discharge status: Home / Self Care | Payer: BC Managed Care – PPO | Source: Ambulatory Visit | Attending: Orthopedic Surgery | Admitting: Orthopedic Surgery

## 2013-07-02 DIAGNOSIS — Z01812 Encounter for preprocedural laboratory examination: Secondary | ICD-10-CM | POA: Insufficient documentation

## 2013-07-02 DIAGNOSIS — Z01818 Encounter for other preprocedural examination: Secondary | ICD-10-CM | POA: Insufficient documentation

## 2013-07-02 HISTORY — DX: Essential (primary) hypertension: I10

## 2013-07-02 HISTORY — DX: Other specified postprocedural states: R11.2

## 2013-07-02 HISTORY — DX: Other specified postprocedural states: Z98.890

## 2013-07-02 LAB — URINE MICROSCOPIC-ADD ON

## 2013-07-02 LAB — ABO/RH: ABO/RH(D): A NEG

## 2013-07-02 LAB — CBC WITH DIFFERENTIAL/PLATELET
Basophils Absolute: 0 10*3/uL (ref 0.0–0.1)
Basophils Relative: 1 % (ref 0–1)
Eosinophils Absolute: 0.2 10*3/uL (ref 0.0–0.7)
Eosinophils Relative: 3 % (ref 0–5)
HCT: 40.5 % (ref 36.0–46.0)
HEMOGLOBIN: 13.3 g/dL (ref 12.0–15.0)
LYMPHS PCT: 20 % (ref 12–46)
Lymphs Abs: 1.7 10*3/uL (ref 0.7–4.0)
MCH: 30 pg (ref 26.0–34.0)
MCHC: 32.8 g/dL (ref 30.0–36.0)
MCV: 91.2 fL (ref 78.0–100.0)
MONOS PCT: 8 % (ref 3–12)
Monocytes Absolute: 0.7 10*3/uL (ref 0.1–1.0)
NEUTROS ABS: 6 10*3/uL (ref 1.7–7.7)
Neutrophils Relative %: 69 % (ref 43–77)
Platelets: 333 10*3/uL (ref 150–400)
RBC: 4.44 MIL/uL (ref 3.87–5.11)
RDW: 13.3 % (ref 11.5–15.5)
WBC: 8.7 10*3/uL (ref 4.0–10.5)

## 2013-07-02 LAB — URINALYSIS, ROUTINE W REFLEX MICROSCOPIC
Bilirubin Urine: NEGATIVE
GLUCOSE, UA: NEGATIVE mg/dL
Hgb urine dipstick: NEGATIVE
KETONES UR: NEGATIVE mg/dL
Nitrite: NEGATIVE
PH: 5.5 (ref 5.0–8.0)
Protein, ur: NEGATIVE mg/dL
SPECIFIC GRAVITY, URINE: 1.025 (ref 1.005–1.030)
Urobilinogen, UA: 0.2 mg/dL (ref 0.0–1.0)

## 2013-07-02 LAB — COMPREHENSIVE METABOLIC PANEL
ALK PHOS: 71 U/L (ref 39–117)
ALT: 13 U/L (ref 0–35)
AST: 18 U/L (ref 0–37)
Albumin: 4.3 g/dL (ref 3.5–5.2)
BILIRUBIN TOTAL: 0.5 mg/dL (ref 0.3–1.2)
BUN: 23 mg/dL (ref 6–23)
CHLORIDE: 103 meq/L (ref 96–112)
CO2: 24 meq/L (ref 19–32)
Calcium: 9.7 mg/dL (ref 8.4–10.5)
Creatinine, Ser: 1.34 mg/dL — ABNORMAL HIGH (ref 0.50–1.10)
GFR calc Af Amer: 45 mL/min — ABNORMAL LOW (ref >90)
GFR, EST NON AFRICAN AMERICAN: 39 mL/min — AB (ref >90)
Glucose, Bld: 86 mg/dL (ref 70–99)
Potassium: 4.3 mEq/L (ref 3.7–5.3)
Sodium: 141 mEq/L (ref 137–147)
Total Protein: 7.4 g/dL (ref 6.0–8.3)

## 2013-07-02 LAB — TYPE AND SCREEN
ABO/RH(D): A NEG
Antibody Screen: NEGATIVE

## 2013-07-02 LAB — PROTIME-INR
INR: 0.93 (ref 0.00–1.49)
PROTHROMBIN TIME: 12.3 s (ref 11.6–15.2)

## 2013-07-02 LAB — SURGICAL PCR SCREEN
MRSA, PCR: NEGATIVE
Staphylococcus aureus: NEGATIVE

## 2013-07-02 LAB — APTT: APTT: 30 s (ref 24–37)

## 2013-07-02 NOTE — Progress Notes (Signed)
07/02/13 0818  OBSTRUCTIVE SLEEP APNEA  Have you ever been diagnosed with sleep apnea through a sleep study? No  Do you snore loudly (loud enough to be heard through closed doors)?  0  Do you often feel tired, fatigued, or sleepy during the daytime? 1  Has anyone observed you stop breathing during your sleep? 0  Do you have, or are you being treated for high blood pressure? 1  BMI more than 35 kg/m2? 1  Age over 71 years old? 1  Neck circumference greater than 40 cm/18 inches? 0  Gender: 0  Obstructive Sleep Apnea Score 4  Score 4 or greater  Results sent to PCP

## 2013-07-04 LAB — URINE CULTURE: Colony Count: 25000

## 2013-07-04 NOTE — Progress Notes (Signed)
Anesthesia chart review: Patient is a 73 year old female scheduled for left TKR on 07/10/13 by Dr. Durward Fortes.  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 PCP is Dr. Elayne Snare who is aware of plans for surgery. Neurologist is Dr. Jannifer Franklin who cleared patient for surgery in March 2015. Cardiologist is Dr. Marlou Porch.  Notes in Epic indicate that a clearance note was faxed from his office as well.  (I left a voice mail with Maudie Mercury to fax to PAT fax.)  EKG on 03/09/13 showed NSR, low voltage QRS, cannot rule out anterior infarct (age undetermined), ST/T wave abnormality, consider lateral ischemia. Artifact in may be interfering with interpretation.  TEE on 03/14/13 showed: - Left ventricle: The cavity size was normal. Wall thickness was normal. Systolic function was normal. The estimated ejection fraction was in the range of 60% to 65%. - Aortic valve: No evidence of vegetation. - Mitral valve: No evidence of vegetation. Mild regurgitation. - Left atrium: No evidence of thrombus in the atrial cavity or appendage. No evidence of thrombus in the appendage. - Right atrium: No evidence of thrombus in the atrial cavity or appendage. - Atrial septum: There was very few bubble contrast cross-over from right to left atrium consistent with very small patent foramen ovale. Redundant intraatrial septum. - Tricuspid valve: No evidence of vegetation. Mild regurgitation. - Pulmonic valve: No evidence of vegetation. - Superior vena cava: The study excluded a thrombus.  There was no afib on three week Holter monitor from 05/2013.  Carotid duplex on 03/10/13 showed: Bilateral: 1-39% ICA stenosis. Vertebral artery flow is antegrade. Right: ICA/CCA ratio is 0.78. Left: ICA/CCA ratio is  0.6.  CXR on 07/02/13 showed: No acute cardiopulmonary abnormality seen. Mild compression deformity of mid thoracic vertebral body is noted which was not present on prior exam of 2007 ; this most likely represents old compression fracture, but if patient is symptomatic in this area, CT scan would be recommended for further evaluation.   Preoperative labs noted. Cr 1.34. CBC, PT/PTT WNL. Urine culture showed 25,000 colonies Viridans Streptococcus.     Voice message left with Maudie Mercury at Dr. Wonda Horner' office regarding urine culture and CXR reports for Dr. Durward Fortes or Baldwin Jamaica, PA-C to review.  I also faxed reports to his office at [phone] with confirmation.  Will defer additional recommendations to Dr. Durward Fortes.    [facility] Short Stay Center/Anesthesiology Phone [phone] 07/04/2013 4:57 PM

## 2013-07-05 ENCOUNTER — Telehealth: Payer: Self-pay | Admitting: Neurology

## 2013-07-05 NOTE — Telephone Encounter (Signed)
Patient called to state that she is having surgery on Tuesday and her surgeon needs advice about whether to have her stop taking her Aspirin before the surgery. Please call and advise patient.

## 2013-07-05 NOTE — H&P (Signed)
CHIEF COMPLAINT:  Painful left knee.   HISTORY OF PRESENT ILLNESS:  Aimee Hood is a very pleasant, 72 year old white female who is seen today for evaluation of her left knee.  We have seen her in the past and actually had her scheduled for a left total knee arthroplasty on 03/27/2013.  She had a stroke on 03/01/2013, and at that time it was felt neurology would not allow for any surgical intervention for 90 days from her stroke.  Because of her continued pain and discomfort in the knee, she was placed into a knee brace.  Sent to physical therapy for 1 visit for a home exercise program.  She has had cortisone injections in the past and actually also Euflexxa injections.  We have tried conservative treatment, but she has failed with viscosupplementation, cortisone injections, therapy, as well as bracing.  She comes in today now stating that her neurologist has told her that she could have surgery and is now interested in having total joint replacement.  Again, she is having chronic, constant pain with sharp, stabbing pain at times.  It has impeded her ability to walk as well as sleep.  She cannot do her activities of daily living.     PAST SURGICAL HISTORY:  Laparoscopic cholecystectomy, vaginal total abdominal hysterectomy with bilateral salpingo-oophorectomy.  She has had arthroscopic surgery to both knees.   MEDICATIONS:  Aspirin 325 mg 1 daily, ramipril 2.5 mg daily, fenofibrate 134 mg daily, omeprazole 20 mg daily, Citrucel 2 daily, vitamin D 1000 international units 2 daily, Extra-Strength Tylenol 1000 mg as needed.   ALLERGIES:  CODEINE which caused her mouth ulcers.  She is not really sure about the OXYCODONE or the HYDROCODONE.   FAMILY HISTORY:  Positive for a mother who has arthritis, father who has had cancer, brother who has had a heart attack as well as cancer, sisters who have hypertension and arthritis.   SOCIAL HISTORY:  She is a 73 year old, white, married female.  A hairdresser.  She does not  smoke or drink.   FOURTEEN-POINT REVIEW OF SYSTEMS:  Positive for change in vision, and she does wear glasses.  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  She does have full upper dentures. She did have a stroke in November 2014 and had an occlusive clot which was in juxtaposition near an aneurysm that had been coiled many years previously.  She has had hypertension for 20 years and is on medication.  Occasional leg cramps.  Occasionally she will have nosebleeds from sinusitis.  Last urinary tract infection was in 2015 and has just gone off her medication.  She urinates twice during the day and 3 times at night.     PHYSICAL EXAMINATION: Vital Signs:  Height 5 feet 6 inches.  Weight 230 pounds.  BMI 37.1.  Temperature 98.8, pulse 64, respirations 14, blood pressure 118/61. Head:  Normocephalic. Eyes:  Pupils equal, round, and reactive to light and accommodation with extraocular movements intact. Ears/Nose/Throat:  Benign. Chest:  Has good expansion. Lungs:  Essentially clear. Cardiac:  Had a regular rhythm and rate.  Normal S1, S2.  No murmurs. Pulses:  Are 2+, bilateral and symmetric in the lower extremities. CNS:  Oriented x3, and cranial nerves II through XII grossly intact. Vascular:  No carotid bruits. Abdomen: Scaphoid, soft, nontender.  No masses palpable.  Normal bowel sounds present. Genital/Rectal/Breast:  Not indicated for an orthopedic evaluation. Musculoskeletal:  She has range of motion from about 10 degrees and barely makes it  to 90 degrees at this time.  She does have patellofemoral crepitance with range of motion.  Pseudolaxity is noted also with varus and valgus stressing.  Trace to 1+ effusion.  Calf is supple and nontender.  Neurovascularly intact distally.   CLINICAL IMPRESSION:   1.  End-stage osteoarthritis of the left knee. 2.  History of recent stroke.  This was an occlusive stroke. 3.  History of hypertension.   At this time I have reviewed  correspondence from Dr. Marlou Porch who feels that she is an acceptable risk from cardiac clearance.  Dr. Dwyane Dee feels that she is also acceptable from a medical standpoint.  Neurology also feels that she has appropriately waited her 90 days that they suggested and actually had a prolonged cardiac monitor which was unremarkable and felt that she is also medically able to have surgery.   RECOMMENDATIONS:  At this time with the above clearances read and noted, I feel that she is a candidate for a left total knee arthroplasty.  The procedure, risks, and benefits were fully explained to her in detail using the appropriate models.  Postop course as well as the equipment that would be needed.  She would like to consider going to [facility] postop.  All questions answered, and we will proceed with total knee replacement in the near future.  Mike Craze [place], [place] [phone]  07/05/2013 12:27 PM

## 2013-07-05 NOTE — Telephone Encounter (Signed)
I called the patient. The patient is having surgery on March 31, and she has come off of aspirin now. The patient will be going on a blood thinner following the surgery.

## 2013-07-09 MED ORDER — CHLORHEXIDINE GLUCONATE 4 % EX LIQD
60.0000 mL | Freq: Every day | CUTANEOUS | Status: DC
  Filled 2013-07-09: qty 60

## 2013-07-09 MED ORDER — CHLORHEXIDINE GLUCONATE 4 % EX LIQD
60.0000 mL | Freq: Once | CUTANEOUS | Status: DC
  Filled 2013-07-09: qty 60

## 2013-07-09 MED ORDER — CEFAZOLIN SODIUM-DEXTROSE 2-3 GM-% IV SOLR
2.0000 g | INTRAVENOUS | Status: AC
  Administered 2013-07-10: 2 g via INTRAVENOUS
  Filled 2013-07-09: qty 50

## 2013-07-10 ENCOUNTER — Encounter (HOSPITAL_COMMUNITY): Payer: Self-pay | Admitting: *Deleted

## 2013-07-10 ENCOUNTER — Encounter (HOSPITAL_COMMUNITY): Payer: BC Managed Care – PPO | Admitting: Vascular Surgery

## 2013-07-10 ENCOUNTER — Inpatient Hospital Stay (HOSPITAL_COMMUNITY)
Admission: RE | Admit: 2013-07-10 | Discharge: 2013-07-12 | DRG: 470 | Disposition: A | Discharge status: Home Health | Payer: BC Managed Care – PPO | Source: Ambulatory Visit | Attending: Orthopaedic Surgery | Admitting: Orthopaedic Surgery

## 2013-07-10 ENCOUNTER — Inpatient Hospital Stay (HOSPITAL_COMMUNITY): Payer: BC Managed Care – PPO | Admitting: Anesthesiology

## 2013-07-10 ENCOUNTER — Encounter (HOSPITAL_COMMUNITY)
Admission: RE | Disposition: A | Discharge status: Home Health | Payer: Self-pay | Source: Ambulatory Visit | Attending: Orthopaedic Surgery

## 2013-07-10 DIAGNOSIS — Z8673 Personal history of transient ischemic attack (TIA), and cerebral infarction without residual deficits: Secondary | ICD-10-CM

## 2013-07-10 DIAGNOSIS — M129 Arthropathy, unspecified: Secondary | ICD-10-CM | POA: Diagnosis present

## 2013-07-10 DIAGNOSIS — K219 Gastro-esophageal reflux disease without esophagitis: Secondary | ICD-10-CM | POA: Diagnosis present

## 2013-07-10 DIAGNOSIS — Z96659 Presence of unspecified artificial knee joint: Secondary | ICD-10-CM

## 2013-07-10 DIAGNOSIS — D62 Acute posthemorrhagic anemia: Secondary | ICD-10-CM | POA: Diagnosis not present

## 2013-07-10 DIAGNOSIS — Z885 Allergy status to narcotic agent status: Secondary | ICD-10-CM

## 2013-07-10 DIAGNOSIS — I10 Essential (primary) hypertension: Secondary | ICD-10-CM | POA: Diagnosis present

## 2013-07-10 DIAGNOSIS — M25469 Effusion, unspecified knee: Secondary | ICD-10-CM | POA: Diagnosis present

## 2013-07-10 DIAGNOSIS — Z7982 Long term (current) use of aspirin: Secondary | ICD-10-CM

## 2013-07-10 DIAGNOSIS — G4700 Insomnia, unspecified: Secondary | ICD-10-CM | POA: Diagnosis present

## 2013-07-10 DIAGNOSIS — M1712 Unilateral primary osteoarthritis, left knee: Secondary | ICD-10-CM

## 2013-07-10 DIAGNOSIS — Z79899 Other long term (current) drug therapy: Secondary | ICD-10-CM

## 2013-07-10 DIAGNOSIS — M1710 Unilateral primary osteoarthritis, unspecified knee: Principal | ICD-10-CM | POA: Diagnosis present

## 2013-07-10 DIAGNOSIS — H9190 Unspecified hearing loss, unspecified ear: Secondary | ICD-10-CM | POA: Diagnosis present

## 2013-07-10 DIAGNOSIS — E7800 Pure hypercholesterolemia, unspecified: Secondary | ICD-10-CM | POA: Diagnosis present

## 2013-07-10 HISTORY — DX: Pure hypercholesterolemia, unspecified: E78.00

## 2013-07-10 HISTORY — DX: Family history of other specified conditions: Z84.89

## 2013-07-10 HISTORY — PX: TOTAL KNEE ARTHROPLASTY: SHX125

## 2013-07-10 SURGERY — ARTHROPLASTY, KNEE, TOTAL
Anesthesia: General | Site: Knee | Laterality: Left

## 2013-07-10 MED ORDER — ALUM & MAG HYDROXIDE-SIMETH 200-200-20 MG/5ML PO SUSP
30.0000 mL | ORAL | Status: DC | PRN
  Administered 2013-07-11: 30 mL via ORAL
  Filled 2013-07-10: qty 30

## 2013-07-10 MED ORDER — OXYCODONE HCL 5 MG PO TABS
5.0000 mg | ORAL_TABLET | ORAL | Status: DC | PRN
  Administered 2013-07-10 – 2013-07-11 (×2): 10 mg via ORAL
  Administered 2013-07-11: 5 mg via ORAL
  Administered 2013-07-11: 10 mg via ORAL
  Administered 2013-07-11: 5 mg via ORAL
  Administered 2013-07-12 (×3): 10 mg via ORAL
  Filled 2013-07-10 (×3): qty 2
  Filled 2013-07-10 (×2): qty 1
  Filled 2013-07-10 (×3): qty 2

## 2013-07-10 MED ORDER — MAGNESIUM HYDROXIDE 400 MG/5ML PO SUSP: 30.0000 mL | Freq: Every day | ORAL | Status: DC | PRN

## 2013-07-10 MED ORDER — OXYCODONE HCL 5 MG PO TABS: 5.0000 mg | ORAL_TABLET | Freq: Once | ORAL | Status: DC | PRN

## 2013-07-10 MED ORDER — ACETAMINOPHEN 650 MG RE SUPP: 650.0000 mg | Freq: Four times a day (QID) | RECTAL | Status: DC | PRN

## 2013-07-10 MED ORDER — ACETAMINOPHEN 325 MG PO TABS: 650.0000 mg | ORAL_TABLET | Freq: Four times a day (QID) | ORAL | Status: DC | PRN

## 2013-07-10 MED ORDER — ONDANSETRON HCL 4 MG/2ML IJ SOLN
INTRAMUSCULAR | Status: AC
  Filled 2013-07-10: qty 2

## 2013-07-10 MED ORDER — FENTANYL CITRATE 0.05 MG/ML IJ SOLN
INTRAMUSCULAR | Status: DC | PRN
  Administered 2013-07-10 (×2): 100 ug via INTRAVENOUS
  Administered 2013-07-10: 50 ug via INTRAVENOUS

## 2013-07-10 MED ORDER — LIDOCAINE HCL (CARDIAC) 20 MG/ML IV SOLN
INTRAVENOUS | Status: DC | PRN
  Administered 2013-07-10: 40 mg via INTRAVENOUS

## 2013-07-10 MED ORDER — DEXAMETHASONE SODIUM PHOSPHATE 4 MG/ML IJ SOLN
INTRAMUSCULAR | Status: AC
  Filled 2013-07-10: qty 2

## 2013-07-10 MED ORDER — ONDANSETRON HCL 4 MG/2ML IJ SOLN: 4.0000 mg | Freq: Four times a day (QID) | INTRAMUSCULAR | Status: DC | PRN

## 2013-07-10 MED ORDER — RAMIPRIL 2.5 MG PO CAPS
2.5000 mg | ORAL_CAPSULE | Freq: Every day | ORAL | Status: DC
  Administered 2013-07-12: 2.5 mg via ORAL
  Filled 2013-07-10 (×2): qty 1

## 2013-07-10 MED ORDER — GLYCOPYRROLATE 0.2 MG/ML IJ SOLN
INTRAMUSCULAR | Status: AC
  Filled 2013-07-10: qty 3

## 2013-07-10 MED ORDER — ROCURONIUM BROMIDE 50 MG/5ML IV SOLN
INTRAVENOUS | Status: AC
  Filled 2013-07-10: qty 1

## 2013-07-10 MED ORDER — OXYCODONE HCL 5 MG/5ML PO SOLN: 5.0000 mg | Freq: Once | ORAL | Status: DC | PRN

## 2013-07-10 MED ORDER — RIVAROXABAN 10 MG PO TABS
10.0000 mg | ORAL_TABLET | ORAL | Status: DC
  Administered 2013-07-10 – 2013-07-11 (×2): 10 mg via ORAL
  Filled 2013-07-10 (×4): qty 1

## 2013-07-10 MED ORDER — FLEET ENEMA 7-19 GM/118ML RE ENEM: 1.0000 | ENEMA | Freq: Once | RECTAL | Status: AC | PRN

## 2013-07-10 MED ORDER — HYDROMORPHONE HCL PF 1 MG/ML IJ SOLN
0.2500 mg | INTRAMUSCULAR | Status: DC | PRN
  Administered 2013-07-10 (×2): 0.25 mg via INTRAVENOUS
  Administered 2013-07-10 (×2): 0.5 mg via INTRAVENOUS

## 2013-07-10 MED ORDER — ACETAMINOPHEN 10 MG/ML IV SOLN
1000.0000 mg | Freq: Four times a day (QID) | INTRAVENOUS | Status: AC
  Administered 2013-07-10 – 2013-07-11 (×4): 1000 mg via INTRAVENOUS
  Filled 2013-07-10 (×4): qty 100

## 2013-07-10 MED ORDER — DEXTROSE 5 % IV SOLN
INTRAVENOUS | Status: DC | PRN
  Administered 2013-07-10: 08:00:00 via INTRAVENOUS

## 2013-07-10 MED ORDER — DEXAMETHASONE SODIUM PHOSPHATE 4 MG/ML IJ SOLN
INTRAMUSCULAR | Status: DC | PRN
  Administered 2013-07-10: 8 mg via INTRAVENOUS

## 2013-07-10 MED ORDER — HYDROMORPHONE HCL PF 1 MG/ML IJ SOLN
INTRAMUSCULAR | Status: AC
  Filled 2013-07-10: qty 1

## 2013-07-10 MED ORDER — PANTOPRAZOLE SODIUM 40 MG PO TBEC
40.0000 mg | DELAYED_RELEASE_TABLET | Freq: Every day | ORAL | Status: DC
  Administered 2013-07-12: 40 mg via ORAL
  Filled 2013-07-10 (×2): qty 1

## 2013-07-10 MED ORDER — ROCURONIUM BROMIDE 100 MG/10ML IV SOLN
INTRAVENOUS | Status: DC | PRN
  Administered 2013-07-10: 50 mg via INTRAVENOUS

## 2013-07-10 MED ORDER — METOCLOPRAMIDE HCL 10 MG PO TABS: 5.0000 mg | ORAL_TABLET | Freq: Three times a day (TID) | ORAL | Status: DC | PRN

## 2013-07-10 MED ORDER — KETOROLAC TROMETHAMINE 15 MG/ML IJ SOLN
15.0000 mg | Freq: Four times a day (QID) | INTRAMUSCULAR | Status: AC
  Administered 2013-07-10 (×2): 15 mg via INTRAVENOUS
  Filled 2013-07-10 (×2): qty 1

## 2013-07-10 MED ORDER — LIDOCAINE HCL (CARDIAC) 20 MG/ML IV SOLN
INTRAVENOUS | Status: AC
  Filled 2013-07-10: qty 5

## 2013-07-10 MED ORDER — GLYCOPYRROLATE 0.2 MG/ML IJ SOLN
INTRAMUSCULAR | Status: DC | PRN
  Administered 2013-07-10: 0.6 mg via INTRAVENOUS

## 2013-07-10 MED ORDER — METOCLOPRAMIDE HCL 5 MG/ML IJ SOLN: 10.0000 mg | Freq: Once | INTRAMUSCULAR | Status: DC | PRN

## 2013-07-10 MED ORDER — KETOROLAC TROMETHAMINE 15 MG/ML IJ SOLN
15.0000 mg | Freq: Four times a day (QID) | INTRAMUSCULAR | Status: DC
  Administered 2013-07-10: 15 mg via INTRAVENOUS

## 2013-07-10 MED ORDER — MIDAZOLAM HCL 2 MG/2ML IJ SOLN
INTRAMUSCULAR | Status: AC
  Filled 2013-07-10: qty 2

## 2013-07-10 MED ORDER — BUPIVACAINE HCL (PF) 0.5 % IJ SOLN
INTRAMUSCULAR | Status: DC | PRN
  Administered 2013-07-10: 25 mL via PERINEURAL

## 2013-07-10 MED ORDER — METHOCARBAMOL 100 MG/ML IJ SOLN
500.0000 mg | Freq: Four times a day (QID) | INTRAVENOUS | Status: DC | PRN
  Administered 2013-07-10: 500 mg via INTRAVENOUS
  Filled 2013-07-10 (×2): qty 5

## 2013-07-10 MED ORDER — BUPIVACAINE-EPINEPHRINE (PF) 0.25% -1:200000 IJ SOLN
INTRAMUSCULAR | Status: AC
  Filled 2013-07-10: qty 30

## 2013-07-10 MED ORDER — SODIUM CHLORIDE 0.9 % IV SOLN
75.0000 mL/h | INTRAVENOUS | Status: DC
  Administered 2013-07-10 – 2013-07-11 (×2): 75 mL/h via INTRAVENOUS

## 2013-07-10 MED ORDER — SODIUM CHLORIDE 0.9 % IV SOLN: INTRAVENOUS | Status: DC

## 2013-07-10 MED ORDER — KETOROLAC TROMETHAMINE 30 MG/ML IJ SOLN
INTRAMUSCULAR | Status: AC
  Filled 2013-07-10: qty 1

## 2013-07-10 MED ORDER — MIDAZOLAM HCL 5 MG/5ML IJ SOLN
INTRAMUSCULAR | Status: DC | PRN
  Administered 2013-07-10: 2 mg via INTRAVENOUS

## 2013-07-10 MED ORDER — DOCUSATE SODIUM 100 MG PO CAPS
100.0000 mg | ORAL_CAPSULE | Freq: Two times a day (BID) | ORAL | Status: DC
  Administered 2013-07-10 – 2013-07-12 (×4): 100 mg via ORAL
  Filled 2013-07-10 (×5): qty 1

## 2013-07-10 MED ORDER — BISACODYL 10 MG RE SUPP: 10.0000 mg | Freq: Every day | RECTAL | Status: DC | PRN

## 2013-07-10 MED ORDER — ACETAMINOPHEN 10 MG/ML IV SOLN
INTRAVENOUS | Status: DC | PRN
  Administered 2013-07-10: 1000 mg via INTRAVENOUS

## 2013-07-10 MED ORDER — ARTIFICIAL TEARS OP OINT
TOPICAL_OINTMENT | OPHTHALMIC | Status: DC | PRN
  Administered 2013-07-10: 1 via OPHTHALMIC

## 2013-07-10 MED ORDER — LABETALOL HCL 5 MG/ML IV SOLN
INTRAVENOUS | Status: AC
  Filled 2013-07-10: qty 4

## 2013-07-10 MED ORDER — SODIUM CHLORIDE 0.9 % IR SOLN
Status: DC | PRN
  Administered 2013-07-10 (×3): 1000 mL

## 2013-07-10 MED ORDER — BUPIVACAINE-EPINEPHRINE 0.25% -1:200000 IJ SOLN
INTRAMUSCULAR | Status: DC | PRN
  Administered 2013-07-10: 30 mL

## 2013-07-10 MED ORDER — PHENOL 1.4 % MT LIQD: 1.0000 | OROMUCOSAL | Status: DC | PRN

## 2013-07-10 MED ORDER — FENTANYL CITRATE 0.05 MG/ML IJ SOLN
INTRAMUSCULAR | Status: AC
  Filled 2013-07-10: qty 5

## 2013-07-10 MED ORDER — ONDANSETRON HCL 4 MG PO TABS: 4.0000 mg | ORAL_TABLET | Freq: Four times a day (QID) | ORAL | Status: DC | PRN

## 2013-07-10 MED ORDER — NEOSTIGMINE METHYLSULFATE 1 MG/ML IJ SOLN
INTRAMUSCULAR | Status: DC | PRN
  Administered 2013-07-10: 4 mg via INTRAVENOUS

## 2013-07-10 MED ORDER — PROPOFOL 10 MG/ML IV BOLUS
INTRAVENOUS | Status: DC | PRN
  Administered 2013-07-10: 160 mg via INTRAVENOUS
  Administered 2013-07-10: 20 mg via INTRAVENOUS

## 2013-07-10 MED ORDER — LACTATED RINGERS IV SOLN
INTRAVENOUS | Status: DC | PRN
  Administered 2013-07-10 (×2): via INTRAVENOUS

## 2013-07-10 MED ORDER — ONDANSETRON HCL 4 MG/2ML IJ SOLN
INTRAMUSCULAR | Status: DC | PRN
  Administered 2013-07-10: 4 mg via INTRAVENOUS

## 2013-07-10 MED ORDER — METOCLOPRAMIDE HCL 5 MG/ML IJ SOLN: 5.0000 mg | Freq: Three times a day (TID) | INTRAMUSCULAR | Status: DC | PRN

## 2013-07-10 MED ORDER — LABETALOL HCL 5 MG/ML IV SOLN
INTRAVENOUS | Status: DC | PRN
  Administered 2013-07-10: 5 mg via INTRAVENOUS
  Administered 2013-07-10 (×2): 2.5 mg via INTRAVENOUS

## 2013-07-10 MED ORDER — PROPOFOL 10 MG/ML IV BOLUS
INTRAVENOUS | Status: AC
  Filled 2013-07-10: qty 20

## 2013-07-10 MED ORDER — HYDROMORPHONE HCL PF 1 MG/ML IJ SOLN
0.5000 mg | INTRAMUSCULAR | Status: DC | PRN
  Administered 2013-07-12: 0.5 mg via INTRAVENOUS
  Filled 2013-07-10: qty 1

## 2013-07-10 MED ORDER — ACETAMINOPHEN 10 MG/ML IV SOLN
INTRAVENOUS | Status: AC
  Filled 2013-07-10: qty 100

## 2013-07-10 MED ORDER — MENTHOL 3 MG MT LOZG: 1.0000 | LOZENGE | OROMUCOSAL | Status: DC | PRN

## 2013-07-10 MED ORDER — METHOCARBAMOL 500 MG PO TABS
500.0000 mg | ORAL_TABLET | Freq: Four times a day (QID) | ORAL | Status: DC | PRN
  Administered 2013-07-11 – 2013-07-12 (×2): 500 mg via ORAL
  Filled 2013-07-10 (×3): qty 1

## 2013-07-10 MED ORDER — CLONAZEPAM 0.5 MG PO TABS: 0.5000 mg | ORAL_TABLET | Freq: Two times a day (BID) | ORAL | Status: DC | PRN

## 2013-07-10 MED ORDER — NEOSTIGMINE METHYLSULFATE 1 MG/ML IJ SOLN
INTRAMUSCULAR | Status: AC
  Filled 2013-07-10: qty 10

## 2013-07-10 MED ORDER — CEFAZOLIN SODIUM-DEXTROSE 2-3 GM-% IV SOLR
2.0000 g | Freq: Four times a day (QID) | INTRAVENOUS | Status: AC
  Administered 2013-07-10 (×2): 2 g via INTRAVENOUS
  Filled 2013-07-10 (×2): qty 50

## 2013-07-10 MED ORDER — ARTIFICIAL TEARS OP OINT
TOPICAL_OINTMENT | OPHTHALMIC | Status: AC
  Filled 2013-07-10: qty 3.5

## 2013-07-10 SURGICAL SUPPLY — 67 items
BANDAGE ESMARK 6X9 LF (GAUZE/BANDAGES/DRESSINGS) ×1 IMPLANT
BLADE SAGITTAL 25.0X1.19X90 (BLADE) ×2 IMPLANT
BLADE SAGITTAL 25.0X1.19X90MM (BLADE) ×1
BNDG ESMARK 6X9 LF (GAUZE/BANDAGES/DRESSINGS) ×3
BOWL SMART MIX CTS (DISPOSABLE) ×3 IMPLANT
CAPT RP KNEE ×3 IMPLANT
CEMENT HV SMART SET (Cement) ×6 IMPLANT
COVER BACK TABLE 24X17X13 BIG (DRAPES) IMPLANT
COVER SURGICAL LIGHT HANDLE (MISCELLANEOUS) ×3 IMPLANT
CUFF TOURNIQUET SINGLE 34IN LL (TOURNIQUET CUFF) ×3 IMPLANT
CUFF TOURNIQUET SINGLE 44IN (TOURNIQUET CUFF) IMPLANT
DRAPE EXTREMITY T 121X128X90 (DRAPE) ×3 IMPLANT
DRAPE PROXIMA HALF (DRAPES) ×3 IMPLANT
DRSG ADAPTIC 3X8 NADH LF (GAUZE/BANDAGES/DRESSINGS) ×3 IMPLANT
DRSG PAD ABDOMINAL 8X10 ST (GAUZE/BANDAGES/DRESSINGS) ×3 IMPLANT
DURAPREP 26ML APPLICATOR (WOUND CARE) ×3 IMPLANT
ELECT CAUTERY BLADE 6.4 (BLADE) ×3 IMPLANT
ELECT REM PT RETURN 9FT ADLT (ELECTROSURGICAL) ×3
ELECTRODE REM PT RTRN 9FT ADLT (ELECTROSURGICAL) ×1 IMPLANT
EVACUATOR 1/8 PVC DRAIN (DRAIN) ×3 IMPLANT
FACESHIELD LNG OPTICON STERILE (SAFETY) ×6 IMPLANT
FLOSEAL 10ML (HEMOSTASIS) IMPLANT
GLOVE BIO SURGEON STRL SZ7 (GLOVE) ×6 IMPLANT
GLOVE BIOGEL M STRL SZ7.5 (GLOVE) ×3 IMPLANT
GLOVE BIOGEL PI IND STRL 6.5 (GLOVE) ×1 IMPLANT
GLOVE BIOGEL PI IND STRL 7.0 (GLOVE) ×2 IMPLANT
GLOVE BIOGEL PI IND STRL 7.5 (GLOVE) ×1 IMPLANT
GLOVE BIOGEL PI IND STRL 8 (GLOVE) ×1 IMPLANT
GLOVE BIOGEL PI IND STRL 8.5 (GLOVE) ×1 IMPLANT
GLOVE BIOGEL PI INDICATOR 6.5 (GLOVE) ×2
GLOVE BIOGEL PI INDICATOR 7.0 (GLOVE) ×4
GLOVE BIOGEL PI INDICATOR 7.5 (GLOVE) ×2
GLOVE BIOGEL PI INDICATOR 8 (GLOVE) ×2
GLOVE BIOGEL PI INDICATOR 8.5 (GLOVE) ×2
GLOVE ECLIPSE 8.0 STRL XLNG CF (GLOVE) ×6 IMPLANT
GLOVE SURG ORTHO 8.5 STRL (GLOVE) ×6 IMPLANT
GOWN STRL REUS W/ TWL LRG LVL3 (GOWN DISPOSABLE) ×2 IMPLANT
GOWN STRL REUS W/TWL 2XL LVL3 (GOWN DISPOSABLE) ×9 IMPLANT
GOWN STRL REUS W/TWL LRG LVL3 (GOWN DISPOSABLE) ×4
HANDPIECE INTERPULSE COAX TIP (DISPOSABLE) ×2
KIT BASIN OR (CUSTOM PROCEDURE TRAY) ×3 IMPLANT
KIT ROOM TURNOVER OR (KITS) ×3 IMPLANT
MANIFOLD NEPTUNE II (INSTRUMENTS) ×3 IMPLANT
NEEDLE 22X1 1/2 (OR ONLY) (NEEDLE) ×3 IMPLANT
NS IRRIG 1000ML POUR BTL (IV SOLUTION) ×3 IMPLANT
PACK TOTAL JOINT (CUSTOM PROCEDURE TRAY) ×3 IMPLANT
PAD ARMBOARD 7.5X6 YLW CONV (MISCELLANEOUS) ×6 IMPLANT
PAD CAST 4YDX4 CTTN HI CHSV (CAST SUPPLIES) ×1 IMPLANT
PADDING CAST COTTON 4X4 STRL (CAST SUPPLIES) ×2
PADDING CAST COTTON 6X4 STRL (CAST SUPPLIES) ×3 IMPLANT
SET HNDPC FAN SPRY TIP SCT (DISPOSABLE) ×1 IMPLANT
SPONGE GAUZE 4X4 12PLY (GAUZE/BANDAGES/DRESSINGS) ×3 IMPLANT
STAPLER VISISTAT 35W (STAPLE) ×3 IMPLANT
SUCTION FRAZIER TIP 10 FR DISP (SUCTIONS) ×3 IMPLANT
SUT BONE WAX W31G (SUTURE) ×3 IMPLANT
SUT ETHIBOND NAB CT1 #1 30IN (SUTURE) ×9 IMPLANT
SUT MNCRL AB 3-0 PS2 18 (SUTURE) ×6 IMPLANT
SUT VIC AB 0 CT1 27 (SUTURE) ×2
SUT VIC AB 0 CT1 27XBRD ANBCTR (SUTURE) ×1 IMPLANT
SUT VIC AB 1 CT1 27 (SUTURE) ×2
SUT VIC AB 1 CT1 27XBRD ANBCTR (SUTURE) ×1 IMPLANT
SYR CONTROL 10ML LL (SYRINGE) ×3 IMPLANT
TOWEL OR 17X24 6PK STRL BLUE (TOWEL DISPOSABLE) ×3 IMPLANT
TOWEL OR 17X26 10 PK STRL BLUE (TOWEL DISPOSABLE) ×3 IMPLANT
TRAY FOLEY CATH 16FRSI W/METER (SET/KITS/TRAYS/PACK) ×3 IMPLANT
WATER STERILE IRR 1000ML POUR (IV SOLUTION) ×3 IMPLANT
WRAP KNEE MAXI GEL POST OP (GAUZE/BANDAGES/DRESSINGS) ×3 IMPLANT

## 2013-07-10 NOTE — Anesthesia Preprocedure Evaluation (Addendum)
Anesthesia Evaluation  Patient identified by MRN, date of birth, ID band Patient awake    Reviewed: Allergy & Precautions, H&P , NPO status , Patient's Chart, lab work & pertinent test results, reviewed documented beta blocker date and time   History of Anesthesia Complications (+) PONV and history of anesthetic complications  Airway Mallampati: II TM Distance: >3 FB Neck ROM: full    Dental  (+) Edentulous Upper, Teeth Intact, Dental Advisory Given   Pulmonary neg pulmonary ROS,  breath sounds clear to auscultation        Cardiovascular hypertension, Pt. on medications + Peripheral Vascular Disease Rhythm:regular     Neuro/Psych  Neuromuscular disease CVA, Residual Symptoms negative psych ROS   GI/Hepatic Neg liver ROS, GERD-  Medicated and Controlled,  Endo/Other  negative endocrine ROS  Renal/GU negative Renal ROS  negative genitourinary   Musculoskeletal   Abdominal   Peds  Hematology negative hematology ROS (+)   Anesthesia Other Findings See surgeon's H&P   Reproductive/Obstetrics negative OB ROS                         Anesthesia Physical Anesthesia Plan  ASA: III  Anesthesia Plan: General   Post-op Pain Management:    Induction: Intravenous  Airway Management Planned: Oral ETT  Additional Equipment:   Intra-op Plan:   Post-operative Plan: Extubation in OR  Informed Consent: I have reviewed the patients History and Physical, chart, labs and discussed the procedure including the risks, benefits and alternatives for the proposed anesthesia with the patient or authorized representative who has indicated his/her understanding and acceptance.   Dental Advisory Given  Plan Discussed with: CRNA and Surgeon  Anesthesia Plan Comments:         Anesthesia Quick Evaluation

## 2013-07-10 NOTE — Anesthesia Postprocedure Evaluation (Signed)
Anesthesia Post Note  Patient: Aimee Hood  Procedure(s) Performed: Procedure(s) (LRB): TOTAL KNEE ARTHROPLASTY (Left)  Anesthesia type: General  Patient location: PACU  Post pain: Pain level controlled  Post assessment: Patient's Cardiovascular Status Stable  Last Vitals:  Filed Vitals:   07/10/13 1045  BP: 124/59  Pulse: 51  Temp:   Resp: 13    Post vital signs: Reviewed and stable  Level of consciousness: alert  Complications: No apparent anesthesia complications

## 2013-07-10 NOTE — Transfer of Care (Signed)
Immediate Anesthesia Transfer of Care Note  Patient: Aimee Hood  Procedure(s) Performed: Procedure(s): TOTAL KNEE ARTHROPLASTY (Left)  Patient Location: PACU  Anesthesia Type:General  Level of Consciousness: awake, oriented and patient cooperative  Airway & Oxygen Therapy: Patient Spontanous Breathing and Patient connected to nasal cannula oxygen  Post-op Assessment: Report given to PACU RN, Post -op Vital signs reviewed and stable and Patient moving all extremities  Post vital signs: Reviewed and stable  Complications: No apparent anesthesia complications

## 2013-07-10 NOTE — Op Note (Signed)
PATIENT ID:      Aimee Hood  MRN:     [id number] DOB/AGE:    Sep 03, 1940 / 73 y.o.       OPERATIVE REPORT    DATE OF PROCEDURE:  07/10/2013       PREOPERATIVE DIAGNOSIS:   LEFT KNEE OSTEOARTHRITIS-END STAGE                                                       Estimated body mass index is 36.49 kg/(m^2) as calculated from the following:   Height as of this encounter: 5\' 6"  (1.676 m).   Weight as of this encounter: 102.513 kg (226 lb).     POSTOPERATIVE DIAGNOSIS:   LEFT KNEE OSTEOARTHRITIS -SAME                                                                    Estimated body mass index is 36.49 kg/(m^2) as calculated from the following:   Height as of this encounter: 5\' 6"  (1.676 m).   Weight as of this encounter: 102.513 kg (226 lb).     PROCEDURE:  Procedure(s): TOTAL KNEE ARTHROPLASTY left     SURGEON:  [name], MD    ASSISTANT:   Biagio Borg, PA-C   (Present and scrubbed throughout the case, critical for assistance with exposure, retraction, instrumentation, and closure.)          ANESTHESIA: regional and general     DRAINS: (left knee) Hemovact drain(s) in the clamped with  Suction Clamped :      TOURNIQUET TIME: * Missing tourniquet times found for documented tourniquets in log:  [id number] *    COMPLICATIONS:  None   CONDITION:  stable  PROCEDURE IN DETAIL: [place], Aimee Hood 07/10/2013, 9:16 AM

## 2013-07-10 NOTE — H&P (Signed)
  The recent History & Physical has been reviewed. I have personally examined the patient today. There is no interval change to the documented History & Physical. The patient would like to proceed with the procedure.  Aimee Hood W 07/10/2013,  7:19 AM

## 2013-07-10 NOTE — Anesthesia Procedure Notes (Addendum)
Anesthesia Regional Block:  Femoral nerve block  Pre-Anesthetic Checklist: ,, timeout performed, Correct Patient, Correct Site, Correct Laterality, Correct Procedure, Correct Position, site marked, Risks and benefits discussed,  Surgical consent,  Pre-op evaluation,  At surgeon's request and post-op pain management  Laterality: Left  Prep: chloraprep       Needles:   Needle Type: Other     Needle Length: 9cm 9 cm Needle Gauge: 21 and 21 G    Additional Needles:  Procedures: ultrasound guided (picture in chart) Femoral nerve block Narrative:  Start time: 07/10/2013 6:56 AM End time: 07/10/2013 7:03 AM Injection made incrementally with aspirations every 5 mL.  Performed by: Personally  Anesthesiologist: C. Frederick MD  Additional Notes: Ultrasound guidance used to: id relevant anatomy, confirm needle position, local anesthetic spread, avoidance of vascular puncture. Picture saved. No complications. Block performed personally by Jessy Oto. Albertina Parr, MD     Procedure Name: Intubation Date/Time: 07/10/2013 7:38 AM Performed by: Terrill Mohr Pre-anesthesia Checklist: Patient identified, Emergency Drugs available, Suction available and Patient being monitored Patient Re-evaluated:Patient Re-evaluated prior to inductionOxygen Delivery Method: Circle system utilized Preoxygenation: Pre-oxygenation with 100% oxygen Intubation Type: IV induction Ventilation: Mask ventilation without difficulty Laryngoscope Size: Mac and 4 Grade View: Grade II Tube type: Oral Tube size: 7.5 mm Number of attempts: 1 Airway Equipment and Method: Stylet Placement Confirmation: ETT inserted through vocal cords under direct vision,  breath sounds checked- equal and bilateral and positive ETCO2 Secured at: 21 (cm at upper gum) cm Tube secured with: Tape Dental Injury: Teeth and Oropharynx as per pre-operative assessment

## 2013-07-10 NOTE — Progress Notes (Signed)
Orthopedic Tech Progress Note Patient Details:  Aimee Hood 07-09-1940 [id number]  CPM Left Knee CPM Left Knee: On Left Knee Flexion (Degrees): 60 Left Knee Extension (Degrees): 0 Additional Comments: Trapeze bar and foot roll   Irish Elders 07/10/2013, 10:42 AM

## 2013-07-10 NOTE — Evaluation (Signed)
Physical Therapy Evaluation Patient Details Name: Aimee Hood MRN: [id number] DOB: January 20, 1941 Today's Date: 07/10/2013   History of Present Illness  Pt is a 73 y/o female admitted s/p L TKA. Pt is 50% PWB.   Clinical Impression  This patient presents with acute pain and decreased functional independence following the above mentioned procedure. At the time of PT eval, pt was able to transfer bed to chair with min assist and multiple knee buckles noted. This patient is appropriate for skilled PT interventions to address functional limitations, improve safety and independence with functional mobility, and return to PLOF.     Follow Up Recommendations Home health PT    Equipment Recommendations  None recommended by PT    Recommendations for Other Services       Precautions / Restrictions Precautions Precautions: Fall;Knee Precaution Comments: Discussed towel roll under heel and NO pillow under knee.  Restrictions Weight Bearing Restrictions: Yes RLE Weight Bearing: Partial weight bearing RLE Partial Weight Bearing Percentage or Pounds: 50      Mobility  Bed Mobility Overal bed mobility: Needs Assistance Bed Mobility: Supine to Sit     Supine to sit: Min assist     General bed mobility comments: VC's for sequencing and technique. Assist for movement and support of LLE.   Transfers Overall transfer level: Needs assistance Equipment used: Rolling walker (2 wheeled) Transfers: Sit to/from [facility] Sit to Stand: Mod assist Stand pivot transfers: Min assist       General transfer comment: VC's for sequencing and safety awareness with the walker. Pt was cued for hand placement on seated surface for safety. Knee buckling with weight bearing, and ambulation was deferred.   Ambulation/Gait                Stairs            Wheelchair Mobility    Modified Rankin (Stroke Patients Only)       Balance Overall balance assessment: Needs  assistance Sitting-balance support: Feet supported Sitting balance-Leahy Scale: Fair     Standing balance support: Bilateral upper extremity supported Standing balance-Leahy Scale: Poor                       Pertinent Vitals/Pain Pt reports minimal pain throughout session.     Home Living Family/patient expects to be discharged to:: Private residence Living Arrangements: Spouse/significant other Available Help at Discharge: Family;Available 24 hours/day Type of Home: House Home Access: Stairs to enter Entrance Stairs-Rails: None Entrance Stairs-Number of Steps: 1 Home Layout: One level Home Equipment: Walker - 2 wheels;Bedside commode;Cane - single point      Prior Function Level of Independence: Independent with assistive device(s)         Comments: Driving and working part time      Hand Dominance   Dominant Hand: Right    Extremity/Trunk Assessment   Upper Extremity Assessment: Defer to OT evaluation           Lower Extremity Assessment: LLE deficits/detail   LLE Deficits / Details: Decreased strength and AROM consistent with TKA  Cervical / Trunk Assessment: Kyphotic  Communication   Communication: Expressive difficulties  Cognition Arousal/Alertness: Lethargic;Suspect due to medications Behavior During Therapy: [facility] for tasks assessed/performed Overall Cognitive Status: Within Functional Limits for tasks assessed                      General Comments      Exercises Total Joint  Exercises Ankle Circles/Pumps: 10 reps Quad Sets: 10 reps      Assessment/Plan    PT Assessment Patient needs continued PT services  PT Diagnosis Difficulty walking;Acute pain   PT Problem List Decreased strength;Decreased range of motion;Decreased activity tolerance;Decreased balance;Decreased mobility;Decreased knowledge of use of DME;Decreased safety awareness;Decreased knowledge of precautions;Pain  PT Treatment Interventions DME instruction;Gait  training;Functional mobility training;Stair training;Therapeutic activities;Therapeutic exercise;Neuromuscular re-education;Patient/family education   PT Goals (Current goals can be found in the Care Plan section) Acute Rehab PT Goals Patient Stated Goal: To return to work PT Goal Formulation: With patient Time For Goal Achievement: 07/17/13 Potential to Achieve Goals: Good    Frequency 7X/week   Barriers to discharge        End of Session Equipment Utilized During Treatment: Gait belt Activity Tolerance: Patient tolerated treatment well Patient left: in chair;with call bell/phone within reach;with family/visitor present         Time: 7001-7494 PT Time Calculation (min): 22 min   Charges:   PT Evaluation $Initial PT Evaluation Tier I: 1 Procedure PT Treatments $Therapeutic Activity: 8-22 mins   PT G CodesJolyn Lent 07/10/2013, 5:30 PM  Jolyn Lent, PT, DPT Acute Rehabilitation Services Pager: [phone]

## 2013-07-10 NOTE — Preoperative (Signed)
Beta Blockers   Reason not to administer Beta Blockers:Not Applicable 

## 2013-07-10 NOTE — Progress Notes (Signed)
Orthopedic Tech Progress Note Patient Details:  Aimee Hood 11/27/1940 [id number] On cpm at 8:00 pm LLE 0-60 Patient ID: Aimee Hood, female   DOB: 1940/07/15, 73 y.o.   MRN: [id number]   Braulio Bosch 07/10/2013, 7:58 PM

## 2013-07-10 NOTE — Op Note (Signed)
NAMEARIELIZ, LATINO NO.:  [id number]  MEDICAL RECORD NO.:  [id number]  LOCATION:  [facility]                         FACILITY:  [facility]  PHYSICIAN:  Vonna Kotyk. Wah Sabic, M.D.DATE OF BIRTH:  06/14/1940  DATE OF PROCEDURE:  07/10/2013 DATE OF DISCHARGE:                              OPERATIVE REPORT   PREOPERATIVE DIAGNOSIS:  End-stage osteoarthritis, left knee.  POSTOPERATIVE DIAGNOSIS:  End-stage osteoarthritis, left knee.  PROCEDURE:  Left total knee replacement.  SURGEON:  Vonna Kotyk. Durward Fortes, M.D.  ASSISTANT:  Aaron Edelman D. Petrarca, PA-C, who was present throughout the operative procedure to ensure its timely completion.  ANESTHESIA:  General with supplemental femoral nerve block.  COMPLICATIONS:  None.  COMPONENTS:  DePuy LCS standard femoral component #3 rotating keeled tibial tray, 12.5 mm polyethylene bridging bearing, a 3 PEG metal back rotating patella.  Components were secured with polymethyl methacrylate.  DESCRIPTION OF PROCEDURE:  Mrs. Hirth was met in the holding area, identified the left knee as appropriate operative site and marked it accordingly.  Any questions were answered.  The patient was then transported to room #7 and placed under general anesthesia without difficulty.  She did receive a preoperative femoral nerve block per Anesthesia.  Tourniquet was applied to the left thigh.  Leg was then prepped with chlorhexidine scrub and then DuraPrep.  The tourniquet to the tips of the toes.  Sterile draping was performed.  With the extremity still elevated, it was Esmarch exsanguinated with a proximal tourniquet at 350 mmHg.  A midline longitudinal incision was made centered about the patella extending from the superior pouch to tibial tubercle.  Via sharp dissection, incision was carried down to the subcutaneous tissue.  Gross bleeders were Bovie coagulated.  There was abundant adipose tissue based on the patient's BMI of 37. First layer of  capsule incised in the midline and medial parapatellar incision was then made with the Bovie. The joint was entered.  There was a clear yellow joint effusion.  The patella was everted 180 degrees laterally.  The knee flexed to 90 degrees.  There was loss of articular cartilage along the medial femoral condyle and tibial plateau.  The large osteophytes more medial than lateral, and also about the patella.  There were large areas of articular cartilage loss at the lateral compartment and there was considerable thinning throughout the patella.  Synovectomy was performed.  We measured a standard femoral component.  First, bony cuts were then made transversely in the posterior on the tibia with a 7-degree angle of declination using the external tibial guide.  Subsequent cuts were then made on the femur using the standard femoral jig.  We measured flexion and extension gaps that appeared to be symmetrical at 10 mm.  Laminar spreaders were then inserted, so that we could visualize the medial lateral compartments and medial lateral meniscectomy were performed.  ACL and PCL were also removed.  The osteophytes removed from the posterior femoral condyle using a 3/4-inch curved osteotome.  A distal femoral valgus cut was made at 3 degrees. The final femoral cut was then used with the standard jig to obtain the tapered cuts in the center holes.  Retractors  were then placed around the tibia which was advanced anteriorly.  We measured a #3 tibial tray.  This was pinned in place.  A center hole was made followed by the keeled cut.  With the tibial jig in place, the 10-mm bridging bearing was inserted followed by the standard femoral component.  Through a full range of motion and full flexion, extension, opened up distal that medial and laterally so we then trialed a 12.5 mm bridging bearing.  We felt that this was more stable and still allowed [place] full extension.  MCL and LCL remained  intact.  Patella was then prepared by removing 10 mm of bone leaving 12 mm of patellar thickness.  The holes were made with the patella jig and the trial patella was inserted through a full range of motion and remained perfectly stable and tracked in the midline.  Trial components were removed.  The joint was copiously irrigated with saline solution.  The final components were then impacted with polymethyl methacrylate.  We initially inserted the #3 tibial tray followed by the 12.5 mm bridging bearing and then the standard femoral component.  The knee was placed in full extension and the components were then impacted and extraneous methacrylate was removed from the periphery of each of the components.  Patella was applied with methacrylate and a patellar clamp.  Extraneous methacrylate was removed from its periphery.  While waiting for the methacrylate to mature, we injected the deep capsule with 0.25% Marcaine with epinephrine and approximately 15 minutes of methacrylate had hardened, the joint was then explored. There was no further extraneous methacrylate.  The components were in excellent position.  There was no opening with a varus or valgus stress. Negative anterior drawer sign.  There was no malrotation of the tibial tray.  We had full extension.  Tourniquet was deflated at approximately 69 minutes.  Gross bleeders were Bovie coagulated.  Bleeding bone was covered with bone wax.  A medium-size Hemovac was then placed through the lateral compartment. Deep capsule was then closed with #1 Ethibond.  Superficial capsule closed with a running 0 Vicryl, subcu with 2-0 Vicryl and 3-0 Monocryl. Skin closed with skin clips.  Sterile bulky dressing was applied followed by the patient's support stocking.  The patient tolerated the procedure well without complications.     Vonna Kotyk. Durward Fortes, M.D.     PWW/MEDQ  D:  07/10/2013  T:  07/10/2013  Job:  [id number]

## 2013-07-11 ENCOUNTER — Encounter (HOSPITAL_COMMUNITY): Payer: Self-pay | Admitting: Orthopaedic Surgery

## 2013-07-11 LAB — CBC
HEMATOCRIT: 29.7 % — AB (ref 36.0–46.0)
HEMOGLOBIN: 9.7 g/dL — AB (ref 12.0–15.0)
MCH: 29.6 pg (ref 26.0–34.0)
MCHC: 32.7 g/dL (ref 30.0–36.0)
MCV: 90.5 fL (ref 78.0–100.0)
PLATELETS: 244 10*3/uL (ref 150–400)
RBC: 3.28 MIL/uL — ABNORMAL LOW (ref 3.87–5.11)
RDW: 13 % (ref 11.5–15.5)
WBC: 11.1 10*3/uL — AB (ref 4.0–10.5)

## 2013-07-11 LAB — BASIC METABOLIC PANEL
BUN: 20 mg/dL (ref 6–23)
CO2: 23 mEq/L (ref 19–32)
Calcium: 8.2 mg/dL — ABNORMAL LOW (ref 8.4–10.5)
Chloride: 103 mEq/L (ref 96–112)
Creatinine, Ser: 1 mg/dL (ref 0.50–1.10)
GFR calc Af Amer: 65 mL/min — ABNORMAL LOW (ref >90)
GFR, EST NON AFRICAN AMERICAN: 56 mL/min — AB (ref >90)
Glucose, Bld: 129 mg/dL — ABNORMAL HIGH (ref 70–99)
POTASSIUM: 4.1 meq/L (ref 3.7–5.3)
SODIUM: 139 meq/L (ref 137–147)

## 2013-07-11 NOTE — Progress Notes (Signed)
Patient ID: Aimee Hood, female   DOB: [birth year], 73 y.o.   MRN: [id number] PATIENT ID: Aimee Hood        MRN:  [id number]          DOB/AGE: 1941-01-16 / 73 y.o.    [name], MD   Biagio Borg, PA-C [address] [place], [place]  [address]                             [phone]   PROGRESS NOTE  Subjective:  negative for Chest Pain  negative for Shortness of Breath  negative for Nausea/Vomiting   negative for Calf Pain    Tolerating Diet: yes         Patient reports pain as mild.     Femoral nerve block still functioning-minimal pain  Objective: Vital signs in last 24 hours:   Patient Vitals for the past 24 hrs:  BP Temp Temp src Pulse Resp SpO2  07/11/13 0513 108/50 mmHg 98.5 F (36.9 C) - 67 16 96 %  07/11/13 0400 - - - - 16 98 %  07/11/13 0212 106/53 mmHg 98.5 F (36.9 C) - 73 16 97 %  07/11/13 0000 - - - - 16 98 %  07/10/13 2123 121/58 mmHg 98.3 F (36.8 C) - 57 16 97 %  07/10/13 2000 - - - - 16 98 %  07/10/13 1440 128/56 mmHg 98 F (36.7 C) Oral 54 16 98 %  07/10/13 1315 132/67 mmHg 97.8 F (36.6 C) - 55 15 97 %  07/10/13 1306 - 97.7 F (36.5 C) - - - -  07/10/13 1130 122/59 mmHg 98.6 F (37 C) - 56 13 100 %  07/10/13 1115 127/57 mmHg - - 49 14 100 %  07/10/13 1100 124/53 mmHg - - 51 12 100 %  07/10/13 1045 124/59 mmHg - - 51 13 100 %  07/10/13 1030 126/55 mmHg - - 48 14 99 %  07/10/13 1015 133/67 mmHg - - 62 17 95 %  07/10/13 1000 143/63 mmHg - - 62 11 100 %  07/10/13 0942 149/68 mmHg 97.8 F (36.6 C) - 74 15 99 %      Intake/Output from previous day:   03/31 0701 - 04/01 0700 In: 1740 [I.V.:1650] Out: 2975 [Urine:1950; Drains:425]   Intake/Output this shift:       Intake/Output     03/31 0701 - 04/01 0700 04/01 0701 - 04/02 0700   I.V. (mL/kg) 1650 (16.1)    Other 90    Total Intake(mL/kg) 1740 (17)    Urine (mL/kg/hr) 1950 (0.8)    Drains 425 (0.2)    Blood 600 (0.2)    Total Output 2975     Net -1235              LABORATORY DATA:  Recent Labs  07/11/13 0430  WBC 11.1*  HGB 9.7*  HCT 29.7*  PLT 244    Recent Labs  07/11/13 0430  NA 139  K 4.1  CL 103  CO2 23  BUN 20  CREATININE 1.00  GLUCOSE 129*  CALCIUM 8.2*   Lab Results  Component Value Date   INR 0.93 07/02/2013   INR 0.95 03/09/2013   INR 0.95 01/07/2010    Recent Radiographic Studies :  Chest 2 View  07/02/2013   CLINICAL DATA:  Hypertension.  EXAM: CHEST  2 VIEW  COMPARISON:  March 09, 2013.  February 02, 2006.  FINDINGS: The heart size and mediastinal contours are within normal limits. Hypoinflation of the lungs is noted. No pleural effusion or pneumothorax is noted. Both lungs are clear. Mild compression deformity of mid thoracic vertebral body is noted.  IMPRESSION: No acute cardiopulmonary abnormality seen.  Mild compression deformity of mid thoracic vertebral body is noted which was not present on prior exam of 2007 ; this most likely represents old compression fracture, but if patient is symptomatic in this area, CT scan would be recommended for further evaluation.   Electronically Signed   By: Sabino Dick M.D.   On: 07/02/2013 10:05     Examination:  General appearance: alert, cooperative and no distress  Wound Exam: clean, dry, intact   Drainage:  Scant/small amount Serosanguinous exudate in hemovac-50cc in 5hrs  Motor Exam: EHL, FHL, Anterior Tibial and Posterior Tibial Intact  Sensory Exam: Superficial Peroneal, Deep Peroneal and Tibial normal  Vascular Exam: Normal  Assessment:    1 Day Post-Op  Procedure(s) (LRB): TOTAL KNEE ARTHROPLASTY (Left)  ADDITIONAL DIAGNOSIS:  Active Problems:   S/P total knee replacement using cement  Acute Blood Loss Anemia-asymptomatic   Plan: Physical Therapy as ordered Partial Weight Bearing @ 50% (PWB)  DVT Prophylaxis:  Xarelto  DISCHARGE PLAN: Home  DISCHARGE NEEDS: HHPT, CPM, Walker and 3-in-1 comode seat   OOB with PT,KVO IV, D/C foley,        Akia Desroches W 07/11/2013 7:59 AM

## 2013-07-11 NOTE — Progress Notes (Signed)
Physical Therapy Treatment Patient Details Name: Aimee Hood MRN: [id number] DOB: [birth year] Today's Date: 07/11/2013    History of Present Illness Pt is a 73 y/o female admitted s/p L TKA. Pt is 50% PWB.     PT Comments    Pt progressing towards physical therapy goals. Continues to demonstrate improvement in strength, AROM, and mobility. Pt anticipates d/c tomorrow. Will continue to follow.   Follow Up Recommendations  Home health PT     Equipment Recommendations  None recommended by PT    Recommendations for Other Services       Precautions / Restrictions Precautions Precautions: Fall;Knee Precaution Comments: Discussed towel roll under heel and NO pillow under knee Restrictions Weight Bearing Restrictions: Yes LLE Weight Bearing: Partial weight bearing LLE Partial Weight Bearing Percentage or Pounds: 50%    Mobility  Bed Mobility Overal bed mobility: Needs Assistance Bed Mobility: Sit to Supine       Sit to supine: Supervision   General bed mobility comments: Pt was able to transition from EOB with no physical assist.   Transfers Overall transfer level: Needs assistance Equipment used: Rolling walker (2 wheeled) Transfers: Sit to/from Stand Sit to Stand: Min guard         General transfer comment: VC's for hand placement on seated surface for safety. Pt able to power-up to full stand without assist.   Ambulation/Gait Ambulation/Gait assistance: Min guard Ambulation Distance (Feet): 250 Feet Assistive device: Rolling walker (2 wheeled) Gait Pattern/deviations: Step-to pattern;Step-through pattern;Decreased stride length;Trunk flexed Gait velocity: Decreased Gait velocity interpretation: Below normal speed for age/gender General Gait Details: VC's for sequencing and technique. Pt able to state PWB status 50%. No episodes of knee buckling noted.    Stairs            Wheelchair Mobility    Modified Rankin (Stroke Patients Only)        Balance Overall balance assessment: Needs assistance Sitting-balance support: Feet supported Sitting balance-Leahy Scale: Fair     Standing balance support: Bilateral upper extremity supported Standing balance-Leahy Scale: Fair                      Cognition Arousal/Alertness: Awake/alert Behavior During Therapy: WFL for tasks assessed/performed Overall Cognitive Status: Within Functional Limits for tasks assessed                      Exercises Total Joint Exercises Quad Sets: 15 reps Short Arc Quad: 15 reps Heel Slides: 15 reps Hip ABduction/ADduction: 15 reps Straight Leg Raises: 15 reps Long Arc Quad: 15 reps    General Comments        Pertinent Vitals/Pain Pt reports 4/10 pain throughout session.     Home Living                      Prior Function            PT Goals (current goals can now be found in the care plan section) Acute Rehab PT Goals Patient Stated Goal: To return to work PT Goal Formulation: With patient Time For Goal Achievement: 07/17/13 Potential to Achieve Goals: Good Progress towards PT goals: Progressing toward goals    Frequency  7X/week    PT Plan Current plan remains appropriate    Co-evaluation             End of Session Equipment Utilized During Treatment: Gait belt Activity Tolerance: Patient tolerated treatment well Patient left:  in chair;with call bell/phone within reach;with family/visitor present     Time: 1255-1327 PT Time Calculation (min): 32 min  Charges:  $Gait Training: 8-22 mins $Therapeutic Exercise: 8-22 mins                    G Codes:      Jolyn Lent 2013-07-30, 4:15 PM  Jolyn Lent, PT, DPT Acute Rehabilitation Services Pager: [phone]

## 2013-07-11 NOTE — Care Management Note (Signed)
CARE MANAGEMENT NOTE 07/11/2013  Patient:  Aimee Hood, Aimee Hood   Account Number:  [id number]  Date Initiated:  07/11/2013  Documentation initiated by:  Ricki Miller  Subjective/Objective Assessment:   73 yr old female s/p left total knee arthroplasty.     Action/Plan:   Patient preoperatively setup with [facility], no changes. Has family support at discharge.   Anticipated DC Date:  07/11/2013   Anticipated DC Plan:  [place]  CM consult      [facility] Choice  HOME HEALTH  DURABLE MEDICAL EQUIPMENT   Choice offered to / List presented to:     DME arranged  [facility]      DME agency  [employer]     [place] arranged  HH-2 PT      [place]   Status of service:  Completed, signed off Medicare Important Message given?   (If response is "NO", the following Medicare IM given date fields will be blank) Date Medicare IM given:   Date Additional Medicare IM given:    Discharge Disposition:  [place]

## 2013-07-11 NOTE — Progress Notes (Signed)
Physical Therapy Treatment Patient Details Name: Aimee Hood MRN: [id number] DOB: 07/18/1940 Today's Date: 07/11/2013    History of Present Illness Pt is a 73 y/o female admitted s/p L TKA. Pt is 50% PWB.     PT Comments    Pt progressing towards physical therapy goals. Was able to demonstrate good technique during gait training, however continues to have knee buckling episodes. Also demonstrated ability to perform therapeutic exercise with little assist required.   Follow Up Recommendations  Home health PT     Equipment Recommendations  None recommended by PT    Recommendations for Other Services       Precautions / Restrictions Precautions Precautions: Fall;Knee Precaution Comments: Discussed towel roll under heel and NO pillow under knee Restrictions Weight Bearing Restrictions: Yes LLE Weight Bearing: Partial weight bearing LLE Partial Weight Bearing Percentage or Pounds: 50%    Mobility  Bed Mobility Overal bed mobility: Needs Assistance Bed Mobility: Supine to Sit     Supine to sit: Supervision     General bed mobility comments: Pt was able to transition to EOB with no physical assist.   Transfers Overall transfer level: Needs assistance Equipment used: Rolling walker (2 wheeled) Transfers: Sit to/from Stand Sit to Stand: Min guard         General transfer comment: VC's for hand placement on seated surface for safety. Pt able to power-up to full stand without assist.   Ambulation/Gait Ambulation/Gait assistance: Min guard;Min assist Ambulation Distance (Feet): 75 Feet Assistive device: Rolling walker (2 wheeled) Gait Pattern/deviations: Step-to pattern;Step-through pattern;Decreased stride length;Trunk flexed Gait velocity: Decreased Gait velocity interpretation: Below normal speed for age/gender General Gait Details: VC's for sequencing and technique. Pt able to state PWB status 50%. 3 episodes of knee buckling. Reviewed with pt increased quad  activation with each step.    Stairs            Wheelchair Mobility    Modified Rankin (Stroke Patients Only)       Balance Overall balance assessment: Needs assistance Sitting-balance support: Feet supported Sitting balance-Leahy Scale: Fair     Standing balance support: Bilateral upper extremity supported Standing balance-Leahy Scale: Fair                      Cognition Arousal/Alertness: Awake/alert Behavior During Therapy: WFL for tasks assessed/performed Overall Cognitive Status: Within Functional Limits for tasks assessed                      Exercises Total Joint Exercises Ankle Circles/Pumps: 10 reps Quad Sets: 15 reps Short Arc Quad: 15 reps Heel Slides: 15 reps Hip ABduction/ADduction: 15 reps Straight Leg Raises: 15 reps Long Arc Quad: 15 reps Knee Flexion: 5 reps (static hold for range) Goniometric ROM: 90 AROM    General Comments        Pertinent Vitals/Pain Pt reports minimal pain throughout session - 4/10 at most.     Home Living Family/patient expects to be discharged to:: Private residence Living Arrangements: Spouse/significant other Available Help at Discharge: Family;Available 24 hours/day (spouse works-son staying for a few days) Type of Home: House Home Access: Stairs to enter Entrance Stairs-Rails: None Home Layout: One level Home Equipment: Environmental consultant - 2 wheels;Bedside commode;Cane - single point      Prior Function Level of Independence: Independent with assistive device(s)      Comments: Driving and working part time    PT Goals (current goals can now be found in  the care plan section) Acute Rehab PT Goals Patient Stated Goal: To return to work PT Goal Formulation: With patient Time For Goal Achievement: 07/17/13 Potential to Achieve Goals: Good Progress towards PT goals: Progressing toward goals    Frequency  7X/week    PT Plan Current plan remains appropriate    End of Session Equipment  Utilized During Treatment: Gait belt Activity Tolerance: Patient tolerated treatment well Patient left: in chair;with call bell/phone within reach;with family/visitor present     Time: 5625-6389 PT Time Calculation (min): 30 min  Charges:  $Gait Training: 8-22 mins $Therapeutic Exercise: 8-22 mins                    G Codes:      Jolyn Lent 08/05/2013, 11:58 AM  Jolyn Lent, PT, DPT Acute Rehabilitation Services Pager: [phone]

## 2013-07-11 NOTE — Evaluation (Addendum)
Occupational Therapy Evaluation Patient Details Name: Aimee Hood MRN: [id number] DOB: 08/29/1940 Today's Date: 07/11/2013    History of Present Illness Pt is a 73 y/o female admitted s/p L TKA. Pt is 50% PWB.    Clinical Impression   Pt s/p above procedure. Education provided to pt and her son. Feel pt is safe to d/c home, from OT standpoint, with family available to assist.     Follow Up Recommendations  No OT follow up;Supervision/Assistance - 24 hour    Equipment Recommendations  None recommended by OT    Recommendations for Other Services       Precautions / Restrictions Precautions Precautions: Fall;Knee Precaution Comments: Reviewed precautions  Restrictions Weight Bearing Restrictions: Yes LLE Weight Bearing: Partial weight bearing LLE Partial Weight Bearing Percentage or Pounds: 50%      Mobility Bed Mobility               General bed mobility comments: pt in recliner chair  Transfers Overall transfer level: Needs assistance Equipment used: Rolling walker (2 wheeled) Transfers: Sit to/from Stand Sit to Stand: Min guard         General transfer comment: cues for technique.    Balance                                    ADL   Grooming: Wash/dry hands;Supervision/safety;Standing   Upper Body Dressing : Set up;Sitting   Lower Body Dressing: Min guard;Sit to/from stand Toilet Transfer: Min guard;Ambulation (3 in 1 over commode) Toileting- Clothing Manipulation and Hygiene: Min guard;Sit to/from stand   Functional mobility during ADLs: Min guard;Rolling walker General ADL Comments: Educated on safe shoe wear and discussed rugs. Educated on use of bag on walker to carry items. Recommended sitting for bathing and dressing and safety with ADLs. OT demonstrated shower transfer technique to pt and her son. Educated that bending down to LLE to don/doff sock is beneficial in that it increases ROM in knee.  Told pt that they do make a  sockaid to assist with left sock if it becomes difficult later and also discussed reacher.     Vision                     Perception     Praxis      Pertinent Vitals/Pain Pain 4/10. Repositioned.      Hand Dominance Right   Extremity/Trunk Assessment Upper Extremity Assessment Upper Extremity Assessment: Overall WFL for tasks assessed   Lower Extremity Assessment Lower Extremity Assessment: Defer to PT evaluation       Communication Communication Communication: No difficulties   Cognition Arousal/Alertness: Awake/alert Behavior During Therapy: WFL for tasks assessed/performed Overall Cognitive Status: Within Functional Limits for tasks assessed                     General Comments       Exercises      Home Living Family/patient expects to be discharged to:: Private residence Living Arrangements: Spouse/significant other Available Help at Discharge: Family;Available 24 hours/day (spouse works-son staying for a few days) Type of Home: House Home Access: Stairs to enter [employer] of Steps: 1 Entrance Stairs-Rails: None Home Layout: One level     Bathroom Shower/Tub: Occupational psychologist: Standard     Home Equipment: Environmental consultant - 2 wheels;Bedside commode;Cane - single point  Prior Functioning/Environment Level of Independence: Independent with assistive device(s)        Comments: Driving and working part time     OT Diagnosis:     OT Problem List:     OT Treatment/Interventions:      OT Goals(Current goals can be found in the care plan section)    OT Frequency:     Barriers to D/C:            End of Session: Equipment Utilized During Treatment: Gait belt;Rolling walker CPM Left Knee CPM Left Knee: Off  Activity Tolerance: Patient tolerated treatment well Patient left: in chair;with call bell/phone within reach;with family/visitor present   Time: 9892-1194 OT Time Calculation (min): 23  min Charges:  OT General Charges $OT Visit: 1 Procedure OT Evaluation $Initial OT Evaluation Tier I: 1 Procedure OT Treatments $Self Care/Home Management : 8-22 mins G-CodesBenito Mccreedy OTR/L [phone] 07/11/2013, 11:09 AM

## 2013-07-12 DIAGNOSIS — M1712 Unilateral primary osteoarthritis, left knee: Secondary | ICD-10-CM | POA: Diagnosis present

## 2013-07-12 LAB — BASIC METABOLIC PANEL
BUN: 15 mg/dL (ref 6–23)
CALCIUM: 8.6 mg/dL (ref 8.4–10.5)
CHLORIDE: 104 meq/L (ref 96–112)
CO2: 25 meq/L (ref 19–32)
Creatinine, Ser: 0.87 mg/dL (ref 0.50–1.10)
GFR calc Af Amer: 76 mL/min — ABNORMAL LOW (ref >90)
GFR calc non Af Amer: 66 mL/min — ABNORMAL LOW (ref >90)
Glucose, Bld: 101 mg/dL — ABNORMAL HIGH (ref 70–99)
POTASSIUM: 5 meq/L (ref 3.7–5.3)
SODIUM: 141 meq/L (ref 137–147)

## 2013-07-12 LAB — CBC
HCT: 32.8 % — ABNORMAL LOW (ref 36.0–46.0)
Hemoglobin: 10.9 g/dL — ABNORMAL LOW (ref 12.0–15.0)
MCH: 30.6 pg (ref 26.0–34.0)
MCHC: 33.2 g/dL (ref 30.0–36.0)
MCV: 92.1 fL (ref 78.0–100.0)
PLATELETS: 262 10*3/uL (ref 150–400)
RBC: 3.56 MIL/uL — AB (ref 3.87–5.11)
RDW: 13.2 % (ref 11.5–15.5)
WBC: 8.2 10*3/uL (ref 4.0–10.5)

## 2013-07-12 MED ORDER — OXYCODONE HCL 5 MG PO TABS: 5.0000 mg | ORAL_TABLET | ORAL | Status: DC | PRN

## 2013-07-12 MED ORDER — RIVAROXABAN 10 MG PO TABS: 10.0000 mg | ORAL_TABLET | ORAL | Status: DC

## 2013-07-12 MED ORDER — METHOCARBAMOL 500 MG PO TABS: 500.0000 mg | ORAL_TABLET | Freq: Three times a day (TID) | ORAL | Status: DC | PRN

## 2013-07-12 NOTE — Progress Notes (Signed)
Patient ID: Aimee Hood, female   DOB: June 09, 1940, 73 y.o.   MRN: [id number] PATIENT ID: Aimee Hood        MRN:  [id number]          DOB/AGE: 1940/08/04 / 73 y.o.    [name], MD   Biagio Borg, PA-C [address] [place], [place]  [address]                             [phone]   PROGRESS NOTE  Subjective:  negative for Chest Pain  negative for Shortness of Breath  negative for Nausea/Vomiting   negative for Calf Pain    Tolerating Diet: yes         Patient reports pain as moderate.     Comfortable with analgesics-awake, alert-no signs of mental status change  Objective: Vital signs in last 24 hours:   Patient Vitals for the past 24 hrs:  BP Temp Pulse Resp SpO2  07/12/13 0504 127/73 mmHg 98.4 F (36.9 C) 85 16 96 %  07/11/13 2119 128/59 mmHg 98.6 F (37 C) 84 16 97 %  07/11/13 1341 103/49 mmHg 98.1 F (36.7 C) 72 18 100 %  07/11/13 1100 89/54 mmHg - - - -  07/11/13 1000 98/49 mmHg - - - -      Intake/Output from previous day:   04/01 0701 - 04/02 0700 In: 940 [P.O.:940] Out: 240 [Drains:240]   Intake/Output this shift:       Intake/Output     04/01 0701 - 04/02 0700 04/02 0701 - 04/03 0700   P.O. 940    I.V. (mL/kg)     Other     Total Intake(mL/kg) 940 (9.2)    Urine (mL/kg/hr)     Drains 240 (0.1)    Blood     Total Output 240     Net +700          Urine Occurrence 8 x       LABORATORY DATA:  Recent Labs  07/11/13 0430 07/12/13 0815  WBC 11.1* 8.2  HGB 9.7* 10.9*  HCT 29.7* 32.8*  PLT 244 262    Recent Labs  07/11/13 0430 07/12/13 0815  NA 139 141  K 4.1 5.0  CL 103 104  CO2 23 25  BUN 20 15  CREATININE 1.00 0.87  GLUCOSE 129* 101*  CALCIUM 8.2* 8.6   Lab Results  Component Value Date   INR 0.93 07/02/2013   INR 0.95 03/09/2013   INR 0.95 01/07/2010    Recent Radiographic Studies :  Chest 2 View  07/02/2013   CLINICAL DATA:  Hypertension.  EXAM: CHEST  2 VIEW  COMPARISON:  March 09, 2013.  February 02, 2006.   FINDINGS: The heart size and mediastinal contours are within normal limits. Hypoinflation of the lungs is noted. No pleural effusion or pneumothorax is noted. Both lungs are clear. Mild compression deformity of mid thoracic vertebral body is noted.  IMPRESSION: No acute cardiopulmonary abnormality seen.  Mild compression deformity of mid thoracic vertebral body is noted which was not present on prior exam of 2007 ; this most likely represents old compression fracture, but if patient is symptomatic in this area, CT scan would be recommended for further evaluation.   Electronically Signed   By: Sabino Dick M.D.   On: 07/02/2013 10:05     Examination:  General appearance: alert, cooperative and no distress  Wound Exam: clean,  dry, intact   Drainage:  None: wound tissue dry  Motor Exam: EHL, FHL, Anterior Tibial and Posterior Tibial Intact  Sensory Exam: Superficial Peroneal, Deep Peroneal and Tibial normal  Vascular Exam: Normal  Assessment:    2 Days Post-Op  Procedure(s) (LRB): TOTAL KNEE ARTHROPLASTY (Left)  ADDITIONAL DIAGNOSIS:  Principal Problem:   Osteoarthritis of left knee Active Problems:   S/P total knee replacement using cement  Acute Blood Loss Anemia-asymptomatic   Plan: Physical Therapy as ordered Partial Weight Bearing @ 50% (PWB)  DVT Prophylaxis:  Xarelto  DISCHARGE PLAN: Home  DISCHARGE NEEDS: HHPT, CPM, Walker and 3-in-1 comode seat   good effort in PT,hemovac out-will plan D/C with F/U in 2 weeks-dressing changed-wound clean and dry      Adham Johnson W 07/12/2013 9:38 AM

## 2013-07-12 NOTE — Discharge Summary (Signed)
[name], MD   Biagio Borg, PA-C [address], [place], [place]  [address]                             [phone]  PATIENT ID: Aimee Hood        MRN:  [id number]          DOB/AGE: [birth year] / 73 y.o.    DISCHARGE SUMMARY  ADMISSION DATE:    07/10/2013 DISCHARGE DATE:   07/12/2013   ADMISSION DIAGNOSIS: LEFT KNEE OSTOARTHRITIS    DISCHARGE DIAGNOSIS:  LEFT KNEE OSTEOARTHRITIS    ADDITIONAL DIAGNOSIS: Active Problems:   S/P total knee replacement using cement  Past Medical History  Diagnosis Date  . Diverticulitis   . Carpal tunnel syndrome of left wrist   . Colon polyp 2007  . Allergy   . Cerebral aneurysm, nonruptured     2-3 mm in size, stable  . Insomnia   . Gastroesophageal reflux disease   . Restless leg syndrome   . PONV (postoperative nausea and vomiting)   . Hypertension   . Family history of anesthesia complication     "brother had PONV"  . High cholesterol   . Stroke 02/2013    denies residual on 07/10/2013)  . Arthritis     "all over" (07/10/2013)    PROCEDURE: Procedure(s): TOTAL KNEE ARTHROPLASTY Left on 07/10/2013  CONSULTS: none     HISTORY: Aimee Hood is a very pleasant, 73 year old white female who is seen today for evaluation of her left knee. We have seen her in the past and actually had her scheduled for a left total knee arthroplasty on 03/27/2013. She had a stroke on 03/01/2013, and at that time it was felt neurology would not allow for any surgical intervention for 90 days from her stroke. Because of her continued pain and discomfort in the knee, she was placed into a knee brace. Sent to physical therapy for 1 visit for a home exercise program. She has had cortisone injections in the past and actually also Euflexxa injections. We have tried conservative treatment, but she has failed with viscosupplementation, cortisone injections, therapy, as well as bracing. She comes in today now stating that her neurologist has told her that she could  have surgery and is now interested in having total joint replacement. Again, she is having chronic, constant pain with sharp, stabbing pain at times. It has impeded her ability to walk as well as sleep. She cannot do her activities of daily living.    HOSPITAL COURSE:  Aimee Hood is a 73 y.o. admitted on 07/10/2013 and found to have a diagnosis of LEFT KNEE OSTEOARTHRITIS.  After appropriate laboratory studies were obtained  they were taken to the operating room on 07/10/2013 and underwent  Procedure(s): TOTAL KNEE ARTHROPLASTY  Left.   They were given perioperative antibiotics:  Anti-infectives   Start     Dose/Rate Route Frequency Ordered Stop   07/10/13 1400  ceFAZolin (ANCEF) IVPB 2 g/50 mL premix     2 g 100 mL/hr over 30 Minutes Intravenous Every 6 hours 07/10/13 1315 07/10/13 2051   07/10/13 0600  ceFAZolin (ANCEF) IVPB 2 g/50 mL premix     2 g 100 mL/hr over 30 Minutes Intravenous On call to O.R. 07/09/13 1417 07/10/13 0745    .  Tolerated the procedure well. Toradol was given post op. Ofirmev given preop and for 24 hours.  POD #1, allowed out  of bed to a chair.  PT for ambulation and exercise program.  IV saline locked.  O2 discontionued.  POD #2, continued PT and ambulation.   Hemovac pulled.  The remainder of the hospital course was dedicated to ambulation and strengthening.   The patient was discharged on 2 Days Post-Op in  Stable condition.  Blood products given:none  DIAGNOSTIC STUDIES: Recent vital signs:  Patient Vitals for the past 24 hrs:  BP Temp Pulse Resp SpO2  07/12/13 0504 127/73 mmHg 98.4 F (36.9 C) 85 16 96 %  07/11/13 2119 128/59 mmHg 98.6 F (37 C) 84 16 97 %  07/11/13 1341 103/49 mmHg 98.1 F (36.7 C) 72 18 100 %  07/11/13 1100 89/54 mmHg - - - -  07/11/13 1000 98/49 mmHg - - - -       Recent laboratory studies:  Recent Labs  07/11/13 0430 07/12/13 0815  WBC 11.1* 8.2  HGB 9.7* 10.9*  HCT 29.7* 32.8*  PLT 244 262    Recent Labs   07/11/13 0430 07/12/13 0815  NA 139 141  K 4.1 5.0  CL 103 104  CO2 23 25  BUN 20 15  CREATININE 1.00 0.87  GLUCOSE 129* 101*  CALCIUM 8.2* 8.6   Lab Results  Component Value Date   INR 0.93 07/02/2013   INR 0.95 03/09/2013   INR 0.95 01/07/2010     Recent Radiographic Studies :  Chest 2 View  07/02/2013   CLINICAL DATA:  Hypertension.  EXAM: CHEST  2 VIEW  COMPARISON:  March 09, 2013.  February 02, 2006.  FINDINGS: The heart size and mediastinal contours are within normal limits. Hypoinflation of the lungs is noted. No pleural effusion or pneumothorax is noted. Both lungs are clear. Mild compression deformity of mid thoracic vertebral body is noted.  IMPRESSION: No acute cardiopulmonary abnormality seen.  Mild compression deformity of mid thoracic vertebral body is noted which was not present on prior exam of 2007 ; this most likely represents old compression fracture, but if patient is symptomatic in this area, CT scan would be recommended for further evaluation.   Electronically Signed   By: Sabino Dick M.D.   On: 07/02/2013 10:05    DISCHARGE INSTRUCTIONS:     Discharge Orders   Future Appointments Provider Department Dept Phone   11/26/2013 9:00 AM Elayne Snare, MD [facility] [phone]   Future Orders Complete By Expires   Call MD / Call 911  As directed    Comments:     If you experience chest pain or shortness of breath, CALL 911 and be transported to the hospital emergency room.  If you develop a fever above 101 F, pus (white drainage) or increased drainage or redness at the wound, or calf pain, call your surgeon's office.   Change dressing  As directed    Comments:     Change dressing on Sunday, then change the dressing daily with sterile 4 x 4 inch gauze dressing and apply TED hose.  You may clean the incision with alcohol prior to redressing.   Constipation Prevention  As directed    Comments:     Drink plenty of fluids.  Prune juice and/or  coffee may be helpful.  You may use a stool softener, such as Colace (over the counter) 100 mg twice a day.  Use MiraLax (over the counter) for constipation as needed but this may take several days to work.  Mag Citrate --OR-- Milk of Magnesia --  OR -- Dulcolax pills/suppositories may also be used but follow directions on the label.   CPM  As directed    Comments:     Continuous passive motion machine (CPM):      Use the CPM from 0 to 60 for 6-8 hours per day.      You may increase by 5-10 per day.  You may break it up into 2 or 3 sessions per day.      Use CPM for 3-4 weeks or until you are told to stop.   Diet general  As directed    Discharge instructions  As directed    Comments:     YOU WERE GIVEN A DEVICE CALLED AN INCENTIVE SPIROMETER TO HELP YOU TAKE DEEP BREATHS.  PLEASE USE THIS AT LEAST TEN (10) TIMES EVERY 1-2 HOURS EVERY DAY TO PREVENT PNEUMONIA.   Do not put a pillow under the knee. Place it under the heel.  As directed    Driving restrictions  As directed    Comments:     No driving for 6 weeks   Increase activity slowly as tolerated  As directed    Lifting restrictions  As directed    Comments:     No lifting for 6 weeks   Partial weight bearing  As directed    Comments:     50 % WEIGHT BEARING AS TAUGHT IN PHYSICAL THERAPY   Questions:     % Body Weight:     Laterality:     Extremity:     Patient may shower  As directed    Comments:     You may shower over the brown dressing.  Once the dressing is removed you may shower without a dressing once there is no drainage.  Do not wash over the wound.  If drainage remains, cover wound with plastic wrap and then shower.   TED hose  As directed    Comments:     Use stockings (TED hose) for 2 weeks on operative leg(s).  You may remove them at night for sleeping.      DISCHARGE MEDICATIONS:     Medication List    STOP taking these medications       aspirin 325 MG tablet     sulfamethoxazole-trimethoprim 800-160 MG per  tablet  Commonly known as:  BACTRIM DS      TAKE these medications       acetaminophen 500 MG tablet  Commonly known as:  TYLENOL  Take 1,000 mg by mouth every 6 (six) hours as needed for mild pain.     cholecalciferol 1000 UNITS tablet  Commonly known as:  VITAMIN D  Take 2,000 Units by mouth daily.     clonazePAM 0.5 MG tablet  Commonly known as:  KLONOPIN  Take 1 tablet (0.5 mg total) by mouth 2 (two) times daily as needed for anxiety.     fenofibrate micronized 134 MG capsule  Commonly known as:  LOFIBRA  Take 1 capsule (134 mg total) by mouth daily before breakfast.     FIBER THERAPY PO  Take 2 tablets by mouth every morning.     methocarbamol 500 MG tablet  Commonly known as:  ROBAXIN  Take 1 tablet (500 mg total) by mouth every 8 (eight) hours as needed for muscle spasms.     omeprazole 20 MG capsule  Commonly known as:  PRILOSEC  Take 1 capsule (20 mg total) by mouth daily.     oxyCODONE 5  MG immediate release tablet  Commonly known as:  Oxy IR/ROXICODONE  Take 1-2 tablets (5-10 mg total) by mouth every 4 (four) hours as needed for moderate pain, severe pain or breakthrough pain.     ramipril 2.5 MG capsule  Commonly known as:  ALTACE  Take 1 capsule (2.5 mg total) by mouth daily.     rivaroxaban 10 MG Tabs tablet  Commonly known as:  XARELTO  Take 1 tablet (10 mg total) by mouth daily.        FOLLOW UP VISIT:   Follow-up Information   Follow up with [facility], Vonna Kotyk, MD. Schedule an appointment as soon as possible for a visit on 07/30/2013.   Specialty:  Orthopedic Surgery   Contact information:   [place]. [place] [address] [phone]       DISPOSITION:   Home  CONDITION:  Stable   Alondra Vandeven 07/12/2013, 9:33 AM

## 2013-07-12 NOTE — Progress Notes (Signed)
Physical Therapy Treatment Patient Details Name: Aimee Hood MRN: [id number] DOB: 1940/11/10 Today's Date: 07/12/2013    History of Present Illness Pt is a 73 y/o female admitted s/p L TKA. Pt is 50% PWB.     PT Comments    Pt progressing slowly towards physical therapy goals. Discussed with pt and family car transfers, HEP expectations, and technique/reps/sets/frequency for HEP. Pt and family anticipate d/c this afternoon.  Follow Up Recommendations  Home health PT     Equipment Recommendations  None recommended by PT    Recommendations for Other Services       Precautions / Restrictions Precautions Precautions: Fall;Knee Precaution Comments: Discussed towel roll under heel and NO pillow under knee Restrictions Weight Bearing Restrictions: Yes LLE Weight Bearing: Partial weight bearing LLE Partial Weight Bearing Percentage or Pounds: 50%    Mobility  Bed Mobility               General bed mobility comments: Pt received sitting up in recliner.  Transfers Overall transfer level: Needs assistance Equipment used: Rolling walker (2 wheeled) Transfers: Sit to/from Stand Sit to Stand: Min guard         General transfer comment: Increased time to achieve full standing due to pain. Pt demonstrated proper hand placement and safety awareness.   Ambulation/Gait Ambulation/Gait assistance: Min guard Ambulation Distance (Feet): 35 Feet Assistive device: Rolling walker (2 wheeled) Gait Pattern/deviations: Step-to pattern;Decreased stride length;Trunk flexed Gait velocity: Decreased Gait velocity interpretation: Below normal speed for age/gender General Gait Details: VC's for sequencing and technique. Pt able to state PWB status 50%. No episodes of knee buckling noted. Pt limited due to increased pain.    Stairs            Wheelchair Mobility    Modified Rankin (Stroke Patients Only)       Balance Overall balance assessment: Needs  assistance Sitting-balance support: Feet supported Sitting balance-Leahy Scale: Fair     Standing balance support: Bilateral upper extremity supported Standing balance-Leahy Scale: Fair                      Cognition Arousal/Alertness: Awake/alert Behavior During Therapy: WFL for tasks assessed/performed Overall Cognitive Status: Within Functional Limits for tasks assessed                      Exercises Total Joint Exercises Quad Sets: 10 reps Heel Slides: 10 reps Goniometric ROM: 75 AROM    General Comments        Pertinent Vitals/Pain Pt continues to report increased pain.     Home Living                      Prior Function            PT Goals (current goals can now be found in the care plan section) Acute Rehab PT Goals Patient Stated Goal: To return to work PT Goal Formulation: With patient Time For Goal Achievement: 07/17/13 Potential to Achieve Goals: Good Progress towards PT goals: Progressing toward goals    Frequency  7X/week    PT Plan Current plan remains appropriate    Co-evaluation             End of Session Equipment Utilized During Treatment: Gait belt Activity Tolerance: Patient tolerated treatment well Patient left: in chair;with call bell/phone within reach;with family/visitor present     Time: 1330-1404 PT Time Calculation (min): 34 min  Charges:  $  Gait Training: 8-22 mins $Therapeutic Exercise: 8-22 mins                    G Codes:      Jolyn Lent 22-Jul-2013, 3:27 PM  Jolyn Lent, PT, DPT Acute Rehabilitation Services Pager: [phone]

## 2013-07-12 NOTE — Progress Notes (Signed)
Physical Therapy Treatment Patient Details Name: Aimee Hood MRN: [id number] DOB: [birth year] Today's Date: 07/12/2013    History of Present Illness Pt is a 73 y/o female admitted s/p L TKA. Pt is 50% PWB.     PT Comments    Pt limited by increased pain this morning. Pt received pain medication at beginning of session, and later stated that she wasn't able to eat any breakfast and became nauseated. Pt unable to tolerate therapeutic exercise at this time. Donned ice packs at end of session and elevated LE's. Will continue to follow.   Follow Up Recommendations  Home health PT     Equipment Recommendations  None recommended by PT    Recommendations for Other Services       Precautions / Restrictions Precautions Precautions: Fall;Knee Precaution Comments: Discussed towel roll under heel and NO pillow under knee Restrictions Weight Bearing Restrictions: Yes RLE Weight Bearing: Partial weight bearing LLE Weight Bearing: Partial weight bearing LLE Partial Weight Bearing Percentage or Pounds: 50%    Mobility  Bed Mobility               General bed mobility comments: Pt received sitting up in recliner.  Transfers Overall transfer level: Needs assistance Equipment used: Rolling walker (2 wheeled) Transfers: Sit to/from Stand Sit to Stand: Min guard         General transfer comment: Increased time to achieve full standing due to pain. Pt demonstrated proper hand placement and safety awareness.   Ambulation/Gait Ambulation/Gait assistance: Min guard Ambulation Distance (Feet): 40 Feet Assistive device: Rolling walker (2 wheeled) Gait Pattern/deviations: Step-to pattern;Decreased stride length;Trunk flexed Gait velocity: Decreased Gait velocity interpretation: Below normal speed for age/gender General Gait Details: VC's for sequencing and technique. Pt able to state PWB status 50%. No episodes of knee buckling noted. Pt limited due to increased pain.    Stairs             Wheelchair Mobility    Modified Rankin (Stroke Patients Only)       Balance Overall balance assessment: Needs assistance Sitting-balance support: Feet supported Sitting balance-Leahy Scale: Fair     Standing balance support: Bilateral upper extremity supported Standing balance-Leahy Scale: Fair                      Cognition Arousal/Alertness: Awake/alert Behavior During Therapy: WFL for tasks assessed/performed Overall Cognitive Status: Within Functional Limits for tasks assessed                      Exercises Total Joint Exercises Ankle Circles/Pumps: 15 reps Quad Sets: 15 reps    General Comments General comments (skin integrity, edema, etc.): Goniometric measurement deferred due to increased pain. Pt was not able to tolerate much exercise.       Pertinent Vitals/Pain 9/10 at rest    [place]                      Prior Function            PT Goals (current goals can now be found in the care plan section) Acute Rehab PT Goals Patient Stated Goal: To return to work PT Goal Formulation: With patient Time For Goal Achievement: 07/17/13 Potential to Achieve Goals: Good Progress towards PT goals: Progressing toward goals    Frequency  7X/week    PT Plan Current plan remains appropriate    Co-evaluation  End of Session Equipment Utilized During Treatment: Gait belt Activity Tolerance: Patient tolerated treatment well Patient left: in chair;with call bell/phone within reach;with family/visitor present     Time: 0832-0910 PT Time Calculation (min): 38 min  Charges:  $Gait Training: 8-22 mins $Therapeutic Activity: 23-37 mins                    G Codes:      Jolyn Lent 20-Jul-2013, 12:12 PM  Jolyn Lent, PT, DPT Acute Rehabilitation Services Pager: [phone]

## 2013-07-18 ENCOUNTER — Other Ambulatory Visit: Payer: Self-pay | Admitting: *Deleted

## 2013-07-18 MED ORDER — FENOFIBRATE MICRONIZED 134 MG PO CAPS: 134.0000 mg | ORAL_CAPSULE | Freq: Every day | ORAL | Status: DC

## 2013-07-27 ENCOUNTER — Other Ambulatory Visit: Payer: Self-pay | Admitting: *Deleted

## 2013-07-27 MED ORDER — RAMIPRIL 2.5 MG PO CAPS: 2.5000 mg | ORAL_CAPSULE | Freq: Every day | ORAL | Status: DC

## 2013-08-03 ENCOUNTER — Other Ambulatory Visit: Payer: Self-pay | Admitting: *Deleted

## 2013-08-03 MED ORDER — FENOFIBRATE MICRONIZED 134 MG PO CAPS: 134.0000 mg | ORAL_CAPSULE | Freq: Every day | ORAL | Status: DC

## 2013-10-23 ENCOUNTER — Other Ambulatory Visit: Payer: Self-pay | Admitting: *Deleted

## 2013-10-23 MED ORDER — CLONAZEPAM 0.5 MG PO TABS: 0.5000 mg | ORAL_TABLET | Freq: Two times a day (BID) | ORAL | Status: DC | PRN

## 2013-10-23 MED ORDER — CLONAZEPAM 0.5 MG PO TABS: ORAL_TABLET | ORAL | Status: DC

## 2013-10-30 ENCOUNTER — Other Ambulatory Visit: Payer: Self-pay | Admitting: *Deleted

## 2013-10-30 ENCOUNTER — Other Ambulatory Visit: Payer: Self-pay | Admitting: Endocrinology

## 2013-10-30 ENCOUNTER — Other Ambulatory Visit (INDEPENDENT_AMBULATORY_CARE_PROVIDER_SITE_OTHER): Payer: BC Managed Care – PPO

## 2013-10-30 DIAGNOSIS — R300 Dysuria: Secondary | ICD-10-CM

## 2013-10-30 DIAGNOSIS — I10 Essential (primary) hypertension: Secondary | ICD-10-CM

## 2013-10-30 LAB — URINALYSIS, ROUTINE W REFLEX MICROSCOPIC
BILIRUBIN URINE: NEGATIVE
HGB URINE DIPSTICK: NEGATIVE
Ketones, ur: NEGATIVE
Nitrite: NEGATIVE
RBC / HPF: NONE SEEN (ref 0–?)
Specific Gravity, Urine: <=1.005 — AB (ref 1.000–1.030)
Total Protein, Urine: NEGATIVE
URINE GLUCOSE: NEGATIVE
UROBILINOGEN UA: 0.2 (ref 0.0–1.0)
pH: 7 (ref 5.0–8.0)

## 2013-10-30 MED ORDER — CIPROFLOXACIN HCL 500 MG PO TABS: 500.0000 mg | ORAL_TABLET | Freq: Two times a day (BID) | ORAL | Status: DC

## 2013-11-02 ENCOUNTER — Other Ambulatory Visit: Payer: Self-pay

## 2013-11-02 DIAGNOSIS — Z1231 Encounter for screening mammogram for malignant neoplasm of breast: Secondary | ICD-10-CM

## 2013-11-26 ENCOUNTER — Encounter: Payer: Self-pay | Admitting: Endocrinology

## 2013-11-26 ENCOUNTER — Ambulatory Visit (INDEPENDENT_AMBULATORY_CARE_PROVIDER_SITE_OTHER): Payer: BC Managed Care – PPO | Admitting: Endocrinology

## 2013-11-26 VITALS — BP 135/74 | HR 67 | Temp 98.2°F | Resp 16 | Ht 66.5 in | Wt 221.8 lb

## 2013-11-26 DIAGNOSIS — E785 Hyperlipidemia, unspecified: Secondary | ICD-10-CM

## 2013-11-26 DIAGNOSIS — I6340 Cerebral infarction due to embolism of unspecified cerebral artery: Secondary | ICD-10-CM

## 2013-11-26 DIAGNOSIS — I10 Essential (primary) hypertension: Secondary | ICD-10-CM

## 2013-11-26 DIAGNOSIS — Z23 Encounter for immunization: Secondary | ICD-10-CM

## 2013-11-26 DIAGNOSIS — M1990 Unspecified osteoarthritis, unspecified site: Secondary | ICD-10-CM

## 2013-11-26 DIAGNOSIS — Z0000 Encounter for general adult medical examination without abnormal findings: Secondary | ICD-10-CM

## 2013-11-26 DIAGNOSIS — D649 Anemia, unspecified: Secondary | ICD-10-CM

## 2013-11-26 LAB — URINALYSIS, ROUTINE W REFLEX MICROSCOPIC
BILIRUBIN URINE: NEGATIVE
HGB URINE DIPSTICK: NEGATIVE
Ketones, ur: NEGATIVE
Leukocytes, UA: NEGATIVE
NITRITE: NEGATIVE
RBC / HPF: NONE SEEN (ref 0–?)
Specific Gravity, Urine: 1.01 (ref 1.000–1.030)
Total Protein, Urine: NEGATIVE
Urine Glucose: NEGATIVE
Urobilinogen, UA: 0.2 (ref 0.0–1.0)
pH: 7.5 (ref 5.0–8.0)

## 2013-11-26 LAB — LIPID PANEL
Cholesterol: 133 mg/dL (ref 0–200)
HDL: 43 mg/dL (ref >39.00)
LDL Cholesterol: 62 mg/dL (ref 0–99)
NONHDL: 90
Total CHOL/HDL Ratio: 3
Triglycerides: 138 mg/dL (ref 0.0–149.0)
VLDL: 27.6 mg/dL (ref 0.0–40.0)

## 2013-11-26 LAB — COMPREHENSIVE METABOLIC PANEL WITH GFR
ALT: 18 U/L (ref 0–35)
AST: 22 U/L (ref 0–37)
Albumin: 4.1 g/dL (ref 3.5–5.2)
Alkaline Phosphatase: 75 U/L (ref 39–117)
BUN: 18 mg/dL (ref 6–23)
CO2: 29 meq/L (ref 19–32)
Calcium: 9.7 mg/dL (ref 8.4–10.5)
Chloride: 106 meq/L (ref 96–112)
Creatinine, Ser: 0.8 mg/dL (ref 0.4–1.2)
GFR: 72 mL/min
Glucose, Bld: 80 mg/dL (ref 70–99)
Potassium: 4.1 meq/L (ref 3.5–5.1)
Sodium: 142 meq/L (ref 135–145)
Total Bilirubin: 0.8 mg/dL (ref 0.2–1.2)
Total Protein: 7.5 g/dL (ref 6.0–8.3)

## 2013-11-26 LAB — IBC PANEL
IRON: 68 ug/dL (ref 42–145)
Saturation Ratios: 12.1 % — ABNORMAL LOW (ref 20.0–50.0)
Transferrin: 401.5 mg/dL — ABNORMAL HIGH (ref 212.0–360.0)

## 2013-11-26 LAB — CBC WITH DIFFERENTIAL/PLATELET
BASOS ABS: 0.1 10*3/uL (ref 0.0–0.1)
Basophils Relative: 0.8 % (ref 0.0–3.0)
Eosinophils Absolute: 0.4 10*3/uL (ref 0.0–0.7)
Eosinophils Relative: 5.6 % — ABNORMAL HIGH (ref 0.0–5.0)
HEMATOCRIT: 36.9 % (ref 36.0–46.0)
Hemoglobin: 12.5 g/dL (ref 12.0–15.0)
LYMPHS ABS: 1.2 10*3/uL (ref 0.7–4.0)
Lymphocytes Relative: 18.1 % (ref 12.0–46.0)
MCHC: 33.7 g/dL (ref 30.0–36.0)
MCV: 84.9 fl (ref 78.0–100.0)
MONO ABS: 0.5 10*3/uL (ref 0.1–1.0)
Monocytes Relative: 8.2 % (ref 3.0–12.0)
Neutro Abs: 4.3 10*3/uL (ref 1.4–7.7)
Neutrophils Relative %: 67.3 % (ref 43.0–77.0)
PLATELETS: 302 10*3/uL (ref 150.0–400.0)
RBC: 4.35 Mil/uL (ref 3.87–5.11)
RDW: 13.4 % (ref 11.5–15.5)
WBC: 6.4 10*3/uL (ref 4.0–10.5)

## 2013-11-26 NOTE — Progress Notes (Signed)
Patient ID: Aimee Hood, female   DOB: 1940/05/29, 73 y.o.      History of Present Illness:   Hypertension:   Has been on treatment with Ramipril for hypertension since about 2003. Blood pressure has usually been very well controlled. No headaches. More recently her dosage has been reduced from her original dose of 10 mg down to 2.5 mg in 2/15  Home blood pressure checks: has been checking periodically and readings are 125/70,  she has no lightheadedness or headaches Renal function has been stable  Hyperlipidemia:  Lipid abnormalities Have been high triglycerides, highest level of 426 in 2007 and also high LDL, up to 156. With treatment her triglycerides have been as low as 57 and LDL down to 31.  When fenofibrate was stopped her triglycerides started increasing along with LDL and this was restarted. She has done well with her diet  but has not been exercising Her triglycerides  are  excellent as of 3/15 with the HDL of 46 .   Lab Results  Component Value Date   CHOL 133 06/19/2013   HDL 47.60 06/19/2013   LDLCALC 62 06/19/2013   TRIG 118.0 06/19/2013   CHOLHDL 3 06/19/2013     PREVENTIVE CARE:  Diet: Low-fat usually Exercise: Trying to walk a little History of falls: None   Annual hemoccults:           ? 2014   Breast self exams:                             yes   Colonoscopy/sigmoidoscopy ?  Mammograms:  8/14  Yearly flu vaccine:                      yes   Bone Density:   2013   Calcium supplements:  regular  Tetanus booster:   unknown   Zostavax: Refuses Pneumovax: 2010        Past Medical History  Diagnosis Date  . Diverticulitis   . Carpal tunnel syndrome of left wrist   . Colon polyp 2007  . Allergy   . Cerebral aneurysm, nonruptured     2-3 mm in size, stable  . Insomnia   . Gastroesophageal reflux disease   . Restless leg syndrome   . PONV (postoperative nausea and vomiting)   . Hypertension   . Family history of anesthesia complication     "brother had  PONV"  . High cholesterol   . Stroke 02/2013    denies residual on 07/10/2013)  . Arthritis     "all over" (07/10/2013)    Past Surgical History  Procedure Laterality Date  . Cholecystectomy    . Breast surgery    . Tee without cardioversion N/A 03/14/2013    Procedure: TRANSESOPHAGEAL ECHOCARDIOGRAM (TEE);  Surgeon: Candee Furbish, MD;  Location: [facility];  Service: Cardiovascular;  Laterality: N/A;  . Total knee arthroplasty Left 07/10/2013  . Knee arthroscopy Bilateral   . Joint replacement    . Vaginal hysterectomy  2000's  . Total knee arthroplasty Left 07/10/2013    Procedure: TOTAL KNEE ARTHROPLASTY;  Surgeon: Garald Balding, MD;  Location: [facility];  Service: Orthopedics;  Laterality: Left;    Family History  Problem Relation Age of Onset  . Heart disease Brother 39  . Cancer Brother     lung  . Diabetes Paternal Aunt   . Cancer Maternal Grandmother   . Cancer Father  lung  . Cancer Sister     Social History:  reports that she has never smoked. She has never used smokeless tobacco. She reports that she does not drink alcohol or use illicit drugs.  Allergies:  Allergies  Allergen Reactions  . Codeine Rash      Medication List       This list is accurate as of: 11/26/13  9:19 AM.  Always use your most recent med list.               acetaminophen 500 MG tablet  Commonly known as:  TYLENOL  Take 1,000 mg by mouth every 6 (six) hours as needed for mild pain.     cholecalciferol 1000 UNITS tablet  Commonly known as:  VITAMIN D  Take 2,000 Units by mouth daily.     clonazePAM 0.5 MG tablet  Commonly known as:  KLONOPIN  Take 1 tablet at bedtime for restless legs     fenofibrate micronized 134 MG capsule  Commonly known as:  LOFIBRA  Take 1 capsule (134 mg total) by mouth daily before breakfast.     FIBER THERAPY PO  Take 2 tablets by mouth every morning.     omeprazole 20 MG capsule  Commonly known as:  PRILOSEC  Take 1 capsule (20 mg total) by  mouth daily.     PRESERVISION AREDS 2 PO  Take by mouth. Takes 2 tablets 2 times daily     ramipril 2.5 MG capsule  Commonly known as:  ALTACE  Take 1 capsule (2.5 mg total) by mouth daily.     rivaroxaban 10 MG Tabs tablet  Commonly known as:  XARELTO  Take 10 mg by mouth daily.         REVIEW OF SYSTEMS:         CONSTITUTIONAL:   No Body aches. Fatigue present, mild   Weight Loss: 5 pounds, likely to be since her surgery  Wt Readings from Last 3 Encounters:  11/26/13 221 lb 12.8 oz (100.608 kg)  07/10/13 226 lb (102.513 kg)  07/10/13 226 lb (102.513 kg)   HEENT:  no headaches, nasal congestion or hearing difficulty Has mild decrease in near vision, reportedly has some macular degeneration   CARDIOLOGY:  no Chest tightness on activity.  Edema yes, Occasionally, mild in the left  Known coronary artery disease: None.   RESPIRATORY:  no Shortness of breath on exertion.  GASTROENTEROLOGY:  Acid reflux yes, she is doing very well with Prilosec as needed. symptoms had been mostly after meals.  no Change in bowel habits    UROLOGY:  Patient denies difficulty urinating, frequent urination  Recurrent Urinary Tract Infection (UTI) present,  Has had a recent infection in 7/15. Still complains of some pain on urination and frequency  MUSCULOSKELETAL:  Arthritis yes, Has had knee joint pains left and had knee replacement on 07/10/13. Still having significant pain although overall better Leg cramps yes, Controlled with magnesium.   OSTEOPENIA: Her last T score was -1.1 at the lumbar spine and normal at the hip, improved since 2005   NEUROLOGY: CVA: She had a possible embolic CVA causing speech difficulties but no source of embolism was found.  Had a negative event monitor and transthoracic echocardiogram  Has been recommended aspirin alone which she is taking regularly  She has  had a small cerebral artery aneurysm since about 2004 and had MRI in 11/14 which was stable.  Will have another MRI periodically, now getting it every other  year  Headache: None.  Insomnia yes, Mild at times. Also had restless legs and does take Klonopin as needed She has no Tingling/numbness.  ENDOCRINOLOGY:   No history of diabetes or thyroid disease.    Psychiatry: No depression or anxiety  Hematological: Had a hemoglobin was low after her knee surgery and needs followup   PHYSICAL EXAM:  BP 135/74  Pulse 67  Temp(Src) 98.2 F (36.8 C)  Resp 16  Ht 5' 6.5" (1.689 m)  Wt 221 lb 12.8 oz (100.608 kg)  BMI 35.27 kg/m2  SpO2 96%  GENERAL: Mild generalized obesity present  No pallor, clubbing or edema.  Skin:  no rash or pigmentation.  EYES:  Externally normal.  Fundii:  normal discs and vessels.  ENT: Oral mucosa, pharynx and tongue normal. Hearing appears adequate  THYROID:  Not palpable. NECK exam: No lymphadenopathy  CAROTIDS:  Normal character; no bruit.  HEART:  Normal apex, S1 and S2; no murmur or click.  CHEST:  Normal shape and expansion.  Lungs: Vescicular breath sounds heard equally.  No crepitations/ wheeze.  BREAST exam: No mass palpable No lymphadenopathy in the axilla  ABDOMEN:  No distention.  Liver and spleen not palpable.  No other mass or tenderness. No epigastric pulsation or bruit  RECTAL/PELVIC exam: Not indicated  NEUROLOGICAL: Gait and balance are normal. She is alert and oriented. Speech normal Reflexes are absent bilaterally at ankles and normal at biceps.  SPINE AND JOINTS:  She has  moderate swelling of the left knee joint with warmth compared to the right. No tenderness Peripheral joints otherwise normal in appearance No deformity of the spine except mild thoracic kyphosis and has normal spinous processes   ASSESSMENT/PLAN :   HYPERTENSION: Blood pressure excellent  and she also monitors at home. Has been on long- term treatment with ramipril and doing well now with 2.5 mg Will need to recheck her creatinine and  electrolytes  History of CVA: Currently on aspirin alone, no etiology previously found and cardiac evaluation was negative   HYPERLIPIDEMIA: Her triglycerides are excellent with fenofibrate despite her difficulty with losing weight.  Since HDL and LDL are  controlled also will continue fenofibrate alone.  MILD OSTEOPENIA. Her last T score was -  1.1 in 2013 and relatively better than before Do not think she needs any followup bone densities Mild Vitamin D deficiency being supplemented with 1000 units daily now. Needs to continue on calcium also  SMALL stable cerebral artery aneurysm: Has been followed regularly with MRI angiograms  REFLUX: Well-controlled with Prilosec as needed   OBESITY: She has done very well in the past with diet and exercise but has started to lose weight again and should be able to do more exercise as her knee pain that's better  RESTLESS leg syndrome: She has mild symptoms, relieved by Klonopin as needed which helps her insomnia also.  History of mild macular degeneration: She will continue followup with ophthalmologist which is due soon  Osteoarthritis left knee: Still has pain and swelling after her surgery and she has signs of inflammation, to followup with orthopedic surgeon   History of UTIs: She was treated last month but she is having some discomfort with urination and frequency now. To have urinalysis checked again    Preventive Medicine  Diet: Continue a low fat, low cholesterol diet.  Exercise: recommended walking as tolerated for exercise at least 3 times a week.  Dentist and Eye doctor exams: recommended these be done annually.  Breast  self examination: recommended this be done monthly.  Calcium: recommended taking 1000 mg a day  Vitamin D: recommended continuing 1000 units daily  Seat Belts: reminded to always use lap and shoulder restraints.  Aspirin 81mg  QD as before  Immunizations:  Zoster (Shingles) Discussed with the patient, she is  not interested in this since she believes she has not had chickenpox Last Pneumovax in 2010 Tetanus toxoid and Prevnar are due, will start with Prevnar today Screening / Special Tests:  Mammogram advised yearly, she is already scheduled for followup  Pap Smear not necessary due to having prior hysterectomy for benign disease.  FOBT annually, given today    Mads Borgmeyer 11/26/2013, 9:19 AM

## 2013-12-03 ENCOUNTER — Other Ambulatory Visit: Payer: BC Managed Care – PPO

## 2013-12-05 ENCOUNTER — Other Ambulatory Visit: Payer: Self-pay | Admitting: *Deleted

## 2013-12-05 ENCOUNTER — Other Ambulatory Visit (INDEPENDENT_AMBULATORY_CARE_PROVIDER_SITE_OTHER): Payer: BC Managed Care – PPO

## 2013-12-05 DIAGNOSIS — Z1211 Encounter for screening for malignant neoplasm of colon: Secondary | ICD-10-CM

## 2013-12-05 LAB — FECAL OCCULT BLOOD, IMMUNOCHEMICAL: Fecal Occult Bld: NEGATIVE

## 2013-12-11 ENCOUNTER — Ambulatory Visit
Admission: RE | Admit: 2013-12-11 | Discharge: 2013-12-11 | Disposition: A | Discharge status: Home / Self Care | Payer: Medicare Other | Source: Ambulatory Visit

## 2013-12-11 DIAGNOSIS — Z1231 Encounter for screening mammogram for malignant neoplasm of breast: Secondary | ICD-10-CM

## 2013-12-16 ENCOUNTER — Other Ambulatory Visit: Payer: Self-pay | Admitting: Endocrinology

## 2014-01-31 ENCOUNTER — Other Ambulatory Visit: Payer: Self-pay | Admitting: Endocrinology

## 2014-01-31 ENCOUNTER — Other Ambulatory Visit: Payer: Self-pay | Admitting: *Deleted

## 2014-01-31 MED ORDER — OMEPRAZOLE 20 MG PO CPDR: 20.0000 mg | DELAYED_RELEASE_CAPSULE | Freq: Every day | ORAL | Status: DC

## 2014-02-27 ENCOUNTER — Encounter: Payer: Self-pay | Admitting: Neurology

## 2014-03-05 ENCOUNTER — Encounter: Payer: Self-pay | Admitting: Neurology

## 2014-03-22 ENCOUNTER — Other Ambulatory Visit: Payer: Self-pay | Admitting: *Deleted

## 2014-03-22 MED ORDER — RAMIPRIL 2.5 MG PO CAPS: 2.5000 mg | ORAL_CAPSULE | Freq: Every day | ORAL | Status: DC

## 2014-03-22 MED ORDER — FENOFIBRATE MICRONIZED 134 MG PO CAPS: ORAL_CAPSULE | ORAL | Status: DC

## 2014-03-22 MED ORDER — OMEPRAZOLE 20 MG PO CPDR: 20.0000 mg | DELAYED_RELEASE_CAPSULE | Freq: Every day | ORAL | Status: DC

## 2014-04-01 ENCOUNTER — Telehealth: Payer: Self-pay

## 2014-04-01 NOTE — Telephone Encounter (Signed)
Requested call back to discuss flu shot.

## 2014-04-23 ENCOUNTER — Telehealth: Payer: Self-pay | Admitting: *Deleted

## 2014-04-23 ENCOUNTER — Other Ambulatory Visit: Payer: Self-pay | Admitting: *Deleted

## 2014-04-23 ENCOUNTER — Ambulatory Visit (INDEPENDENT_AMBULATORY_CARE_PROVIDER_SITE_OTHER): Payer: Medicare HMO | Admitting: *Deleted

## 2014-04-23 DIAGNOSIS — Z23 Encounter for immunization: Secondary | ICD-10-CM | POA: Diagnosis not present

## 2014-04-23 MED ORDER — RAMIPRIL 2.5 MG PO CAPS: 2.5000 mg | ORAL_CAPSULE | Freq: Every day | ORAL | Status: DC

## 2014-04-23 MED ORDER — FENOFIBRATE MICRONIZED 134 MG PO CAPS: ORAL_CAPSULE | ORAL | Status: DC

## 2014-04-23 MED ORDER — OMEPRAZOLE 20 MG PO CPDR: 20.0000 mg | DELAYED_RELEASE_CAPSULE | Freq: Every day | ORAL | Status: DC

## 2014-04-23 NOTE — Telephone Encounter (Signed)
Those are different medications and cannot be substituted

## 2014-04-23 NOTE — Telephone Encounter (Signed)
Patient came in today for a flu shot, she said her pharmacy gave her different options for a lower cost cholesterol medicine, she's on fenofibrate, but Simvastatin and lovastatin are tier 1 and pravastatin is a tier 2, please advise which one she can switch to.

## 2014-04-23 NOTE — Telephone Encounter (Signed)
Noted, message left on patients vm 

## 2014-06-04 ENCOUNTER — Ambulatory Visit: Payer: Commercial Managed Care - HMO | Admitting: Endocrinology

## 2014-06-05 ENCOUNTER — Telehealth: Payer: Self-pay | Admitting: Cardiology

## 2014-06-05 NOTE — Telephone Encounter (Signed)
Can we get her on the Flex schedule tomorrow to evaluate chest pain? Richardson Dopp, PA-C   06/05/2014 4:49 PM

## 2014-06-05 NOTE — Telephone Encounter (Signed)
Patient called with report that yesterday, 2/23, she experienced "chest heaviness" to center of chest upon wakening. No other symptoms. Denies SOB, dizziness or neurological sx. Last OV 03/2013. Patient has Hx CVA. Patient states she does not take Xarelto, but she does take ASA daily. States she has not been having any problems or issues but would like to be seen related to this occurrence of "chest discomfort".  Appt scheduled with Richardson Dopp, PA, on March 9th. Routed to Dr. Marlou Porch to determine if patient needs lab work or EKG prior to appt.

## 2014-06-05 NOTE — Telephone Encounter (Signed)
Agree with Nicki Reaper, try to get her on flex schedule tomorrow.

## 2014-06-05 NOTE — Telephone Encounter (Signed)
New Message   Pt c/o of Chest Pain: STAT if CP now or developed within 24 hours  1. Are you having CP right now? no  2. Are you experiencing any other symptoms (ex. SOB, nausea, vomiting, sweating)? no  3. How long have you been experiencing CP? 2/23 morning, eased off throughout the day  4. Is your CP continuous or coming and going? Heaviness for most of morning  5. Have you taken Nitroglycerin? no ?

## 2014-06-10 ENCOUNTER — Telehealth: Payer: Self-pay | Admitting: *Deleted

## 2014-06-10 NOTE — Telephone Encounter (Signed)
Called patient to check on her status. She is willing to be seen earlier than her 06/19/14 appointment, as she states her problem is not getting any better over this past weekend. She states tomorrow morning would be great. Appointment scheduled for 06/11/14 @ 10:30 am with Richardson Dopp, PA.  Patient instructed should she feel worse or new symptoms prior to being seen on 3/1 that she go ahead and proceed to Emergency Room or call 911. Patient verbalized understanding and agreement.

## 2014-06-10 NOTE — Telephone Encounter (Signed)
Appointment scheduled for 06/11/14 @ 10:30 am with Richardson Dopp, PA. Called patient to confirm date and time. Verbalized appreciation for working her in sooner to be seen by Richardson Dopp.

## 2014-06-11 ENCOUNTER — Encounter: Payer: Self-pay | Admitting: *Deleted

## 2014-06-11 ENCOUNTER — Encounter: Payer: Self-pay | Admitting: Physician Assistant

## 2014-06-11 ENCOUNTER — Other Ambulatory Visit (HOSPITAL_COMMUNITY)
Admission: RE | Admit: 2014-06-11 | Discharge: 2014-06-11 | Disposition: A | Discharge status: Home / Self Care | Payer: Medicare HMO | Source: Other Acute Inpatient Hospital | Attending: Physician Assistant | Admitting: Physician Assistant

## 2014-06-11 ENCOUNTER — Telehealth: Payer: Self-pay | Admitting: *Deleted

## 2014-06-11 ENCOUNTER — Ambulatory Visit (INDEPENDENT_AMBULATORY_CARE_PROVIDER_SITE_OTHER): Payer: Medicare HMO | Admitting: Physician Assistant

## 2014-06-11 VITALS — BP 138/72 | HR 68 | Ht 66.5 in | Wt 233.0 lb

## 2014-06-11 DIAGNOSIS — I639 Cerebral infarction, unspecified: Secondary | ICD-10-CM

## 2014-06-11 DIAGNOSIS — E785 Hyperlipidemia, unspecified: Secondary | ICD-10-CM

## 2014-06-11 DIAGNOSIS — R062 Wheezing: Secondary | ICD-10-CM | POA: Diagnosis present

## 2014-06-11 DIAGNOSIS — R0602 Shortness of breath: Secondary | ICD-10-CM

## 2014-06-11 DIAGNOSIS — I10 Essential (primary) hypertension: Secondary | ICD-10-CM

## 2014-06-11 DIAGNOSIS — R079 Chest pain, unspecified: Secondary | ICD-10-CM

## 2014-06-11 DIAGNOSIS — R7989 Other specified abnormal findings of blood chemistry: Secondary | ICD-10-CM

## 2014-06-11 LAB — D-DIMER, QUANTITATIVE: D-Dimer, Quant: 1.16 ug/mL-FEU — ABNORMAL HIGH (ref 0.00–0.48)

## 2014-06-11 LAB — CBC WITH DIFFERENTIAL/PLATELET
Basophils Absolute: 0 10*3/uL (ref 0.0–0.1)
Basophils Relative: 0.7 % (ref 0.0–3.0)
EOS ABS: 0.2 10*3/uL (ref 0.0–0.7)
Eosinophils Relative: 3.6 % (ref 0.0–5.0)
HCT: 38.7 % (ref 36.0–46.0)
Hemoglobin: 12.9 g/dL (ref 12.0–15.0)
Lymphocytes Relative: 25.1 % (ref 12.0–46.0)
Lymphs Abs: 1.6 10*3/uL (ref 0.7–4.0)
MCHC: 33.4 g/dL (ref 30.0–36.0)
MCV: 85.8 fl (ref 78.0–100.0)
MONOS PCT: 9.7 % (ref 3.0–12.0)
Monocytes Absolute: 0.6 10*3/uL (ref 0.1–1.0)
NEUTROS ABS: 3.9 10*3/uL (ref 1.4–7.7)
Neutrophils Relative %: 60.9 % (ref 43.0–77.0)
PLATELETS: 317 10*3/uL (ref 150.0–400.0)
RBC: 4.51 Mil/uL (ref 3.87–5.11)
RDW: 13.5 % (ref 11.5–15.5)
WBC: 6.4 10*3/uL (ref 4.0–10.5)

## 2014-06-11 LAB — BASIC METABOLIC PANEL
BUN: 17 mg/dL (ref 6–23)
CHLORIDE: 106 meq/L (ref 96–112)
CO2: 31 mEq/L (ref 19–32)
Calcium: 9.8 mg/dL (ref 8.4–10.5)
Creatinine, Ser: 0.94 mg/dL (ref 0.40–1.20)
GFR: 62.27 mL/min (ref >60.00)
Glucose, Bld: 97 mg/dL (ref 70–99)
POTASSIUM: 4.3 meq/L (ref 3.5–5.1)
Sodium: 140 mEq/L (ref 135–145)

## 2014-06-11 LAB — PROTIME-INR
INR: 1 ratio (ref 0.8–1.0)
Prothrombin Time: 10.6 s (ref 9.6–13.1)

## 2014-06-11 LAB — D-DIMER, QUANTITATIVE (NOT AT ARMC): D DIMER QUANT: 1.16 ug{FEU}/mL — AB (ref 0.00–0.48)

## 2014-06-11 MED ORDER — NITROGLYCERIN 0.4 MG SL SUBL: 0.4000 mg | SUBLINGUAL_TABLET | SUBLINGUAL | Status: DC | PRN

## 2014-06-11 MED ORDER — METOPROLOL SUCCINATE ER 25 MG PO TB24: 25.0000 mg | ORAL_TABLET | Freq: Every day | ORAL | Status: DC

## 2014-06-11 NOTE — Telephone Encounter (Signed)
Pt notified about lab results, all normal except D-Dimer elevated at 1.16. S/w Lattie Haw in [place] while pt on phone as well and scheduled CTA 8:45 06/12/14. I skyped Dorian Pod and lmom for [employer] for [employer]. Pt agreeable to Plan of care.

## 2014-06-11 NOTE — H&P (Signed)
History and Physical   Date:  06/11/2014   ID:  Aimee Hood, DOB 1940-04-16, MRN [id number]  PCP:  Yong Channel, MD  Cardiologist:  Dr. Candee Furbish     Chief Complaint  Patient presents with  . Chest Pain     History of Present Illness: Aimee Hood is a 74 y.o. female with a hx of HTN, HL with high triglycerides, obesity, DJD.  Evaluated by Dr. Candee Furbish in 02/2013 for surgical clearance prior to L TKR.  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  TEE was neg for source of embolus and event monitor did not demonstrate AFib.  She eventually had her L TKR 07/2013.  She called in recently with an episode of chest pain and was added on to my schedule today for evaluation.    She tells me that she experienced an episode of substernal chest tightness and dyspnea with assoc L arm pain ~ 2 weeks ago.  Symptoms started at rest and did not worsen with activity.  He CP lasted a few hours and resolved on their own.  She has noted exertional dyspnea and chest heaviness since that time.  Symptoms occur with mild to mod exertion (CCS 2-3).  She has occasional L arm pain associated.  She has relief with resting.  She denies nausea or diaphoresis assoc with her symptoms.  She denies orthopnea, PND, edema, syncope.    Studies/Reports Reviewed Today:  Event Monitor 05/30/13 No AFib  TEE 03/14/13 - EF 60-65%. - Mild MR - No LA or LAA clot - No RA clot. - Atrial septum: small patent foramen ovale. Redundant intraatrial septum.  Carotid US 03/10/13 Bilateral: 1-39% ICA stenosis  Echocardiogram 03/09/13 - EF 55-60%. Wall motion was normal - Mitral valve: Mild regurgitation. - Left atrium: The atrium was mildly dilated.   Past Medical History  Diagnosis Date  . Diverticulitis   . Carpal tunnel syndrome of left wrist   . Colon polyp 2007  . Allergy   . Cerebral aneurysm, nonruptured     2-3 mm in size, stable  . Insomnia   . Gastroesophageal reflux disease   .  Restless leg syndrome   . PONV (postoperative nausea and vomiting)   . Hypertension   . Family history of anesthesia complication     "brother had PONV"  . High cholesterol   . Stroke 02/2013    denies residual on 07/10/2013)  . Arthritis     "all over" (07/10/2013)    Past Surgical History  Procedure Laterality Date  . Cholecystectomy    . Breast surgery    . Tee without cardioversion N/A 03/14/2013    Procedure: TRANSESOPHAGEAL ECHOCARDIOGRAM (TEE);  Surgeon: Candee Furbish, MD;  Location: [facility];  Service: Cardiovascular;  Laterality: N/A;  . Total knee arthroplasty Left 07/10/2013  . Knee arthroscopy Bilateral   . Joint replacement    . Vaginal hysterectomy  2000's  . Total knee arthroplasty Left 07/10/2013    Procedure: TOTAL KNEE ARTHROPLASTY;  Surgeon: Garald Balding, MD;  Location: [facility];  Service: Orthopedics;  Laterality: Left;     Current Outpatient Prescriptions  Medication Sig Dispense Refill  . acetaminophen (TYLENOL) 500 MG tablet Take 1,000 mg by mouth every 6 (six) hours as needed for mild pain.    Marland Kitchen aspirin 325 MG tablet Take 325 mg by mouth daily.    . cholecalciferol (VITAMIN D) 1000 UNITS tablet Take 2,000 Units by mouth daily.    Marland Kitchen  clonazePAM (KLONOPIN) 0.5 MG tablet Take 1 tablet at bedtime for restless legs 90 tablet 1  . fenofibrate micronized (LOFIBRA) 134 MG capsule TAKE 1 CAPSULE (134 MG TOTAL) BY MOUTH DAILY BEFORE BREAKFAST. 90 capsule 1  . Methylcellulose, Laxative, (FIBER THERAPY PO) Take 2 tablets by mouth every morning.    . Multiple Vitamins-Minerals (PRESERVISION AREDS 2 PO) Take by mouth. Takes 2 tablets 2 times daily    . omeprazole (PRILOSEC) 20 MG capsule Take 1 capsule (20 mg total) by mouth daily. 90 capsule 3  . ramipril (ALTACE) 2.5 MG capsule Take 1 capsule (2.5 mg total) by mouth daily. 90 capsule 1   No current facility-administered medications for this visit.    Allergies:   Codeine    Social History:  The patient  reports  that she has never smoked. She has never used smokeless tobacco. She reports that she does not drink alcohol or use illicit drugs.   Family History:  The patient's family history includes Cancer in her brother, father, maternal grandmother, and sister; Diabetes in her paternal aunt; Heart attack in her brother; Heart disease (age of onset: 42) in her brother; Hypertension in her father. There is no history of Stroke.    ROS:   Please see the history of present illness.   Review of Systems  Eyes: Positive for visual disturbance.  Cardiovascular: Positive for chest pain and leg swelling.  Respiratory: Positive for shortness of breath.   Musculoskeletal: Positive for myalgias.  All other systems reviewed and are negative.    PHYSICAL EXAM: VS:  BP 138/72 mmHg  Pulse 68  Ht 5' 6.5" (1.689 m)  Wt 233 lb (105.688 kg)  BMI 37.05 kg/m2    Wt Readings from Last 3 Encounters:  06/11/14 233 lb (105.688 kg)  11/26/13 221 lb 12.8 oz (100.608 kg)  07/10/13 226 lb (102.513 kg)     GEN: Well nourished, well developed, in no acute distress HEENT: normal Neck: no JVD, no carotid bruits, no masses Cardiac:  Normal S1/S2, RRR; no murmur, no rubs or gallops, no edema  Respiratory:  clear to auscultation bilaterally, no wheezing, rhonchi or rales. GI: soft, nontender, nondistended, + BS MS: no deformity or atrophy Skin: warm and dry  Neuro:  CNs II-XII intact, Strength and sensation are intact Psych: Normal affect   EKG:  EKG is ordered today.  It demonstrates:   NSR, HR 68, LAD, low voltage, NSSTTW changes, no change from prior tracing.   Recent Labs: 11/26/2013: ALT 18; BUN 18; Creatinine 0.8; Hemoglobin 12.5; Platelets 302.0; Potassium 4.1; Sodium 142    Lipid Panel    Component Value Date/Time   CHOL 133 11/26/2013 0955   TRIG 138.0 11/26/2013 0955   HDL 43.00 11/26/2013 0955   CHOLHDL 3 11/26/2013 0955   VLDL 27.6 11/26/2013 0955   LDLCALC 62 11/26/2013 0955      ASSESSMENT  AND PLAN:  1.  Chest Pain:  She has symptoms of exertional chest pain that is concerning for CCS Class 2-3 angina.  She has never had symptoms like this. ECG is unchanged.  She has no signs or symptoms of CHF.  She has some risk factors for CAD.    -  Recommend proceeding with cardiac catheterization.  Reviewed with Dr. Jenkins Rouge (DOD) who agreed.  Risks and benefits of cardiac catheterization have been discussed with the patient.  These include bleeding, infection, kidney damage, stroke, heart attack, death.  The patient understands these risks and is willing  to proceed.     -  Check DDimer.  If abnormal, she will need Chest CTA to rule ou pulmonary embolism    -  Start Toprol-XL 25 mg QD.  Give Rx for prn NTG.    -  Continue ASA.    -  Go to ED if worse symptoms.  2.  HTN:  BP controlled.  Continue Altace.  Add Toprol as noted above for antianginal Rx. 3.  Hx of CVA:  Continue ASA. 4.  Hyperlipidemia:  Continue current Rx.  Managed by PCP.    Current medicines are reviewed at length with the patient today.  The patient does not have concerns regarding medicines.  The following changes have been made:  As above.   Labs/ tests ordered today include:  Orders Placed This Encounter  Procedures  . Basic Metabolic Panel (BMET)  . CBC w/Diff  . INR/PT  . D-Dimer, Quantitative  . EKG 12-Lead    Disposition:   FU with Dr. Candee Furbish or me 2 weeks after her cath.    Signed, Versie Starks, MHS 06/11/2014 11:21 AM    [facility] [address], [place], [place]  [address] Phone: [phone]; Fax: [phone]

## 2014-06-11 NOTE — Patient Instructions (Addendum)
Your physician has requested that you have a cardiac catheterization. Cardiac catheterization is used to diagnose and/or treat various heart conditions. Doctors may recommend this procedure for a number of different reasons. The most common reason is to evaluate chest pain. Chest pain can be a symptom of coronary artery disease (CAD), and cardiac catheterization can show whether plaque is narrowing or blocking your heart's arteries. This procedure is also used to evaluate the valves, as well as measure the blood flow and oxygen levels in different parts of your heart. For further information please visit [email or web address]. Please follow instruction sheet, as given.  Your physician recommends that you return for lab work in: TODAY; BMET, CBC W/DIFF, PT/INR AND A STAT D-DIMER  AN RX FOR NTG HAS BEEN SENT IN. YOU HAVE BEEN ADVISED AS TO HOW AND WHEN TO USE  NTG  START TOPROL XL 25 MG 1 TABLET DAILY; RX SENT IN  YOU HAVE A FOLLOW UP WITH [place], [facility] 07/03/14 @ 10:10 POST CATH

## 2014-06-11 NOTE — Progress Notes (Signed)
Cardiology Office Note   Date:  06/11/2014   ID:  Aimee, Hood May 27, 1940, MRN [id number]  PCP:  Yong Channel, MD  Cardiologist:  Dr. Candee Furbish     Chief Complaint  Patient presents with  . Chest Pain     History of Present Illness: Aimee Hood is a 74 y.o. female with a hx of HTN, HL with high triglycerides, obesity, DJD.  Evaluated by Dr. Candee Furbish in 02/2013 for surgical clearance prior to L TKR.  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  TEE was neg for source of embolus and event monitor did not demonstrate AFib.  She eventually had her L TKR 07/2013.  She called in recently with an episode of chest pain and was added on to my schedule today for evaluation.    She tells me that she experienced an episode of substernal chest tightness and dyspnea with assoc L arm pain ~ 2 weeks ago.  Symptoms started at rest and did not worsen with activity.  He CP lasted a few hours and resolved on their own.  She has noted exertional dyspnea and chest heaviness since that time.  Symptoms occur with mild to mod exertion (CCS 2-3).  She has occasional L arm pain associated.  She has relief with resting.  She denies nausea or diaphoresis assoc with her symptoms.  She denies orthopnea, PND, edema, syncope.    Studies/Reports Reviewed Today:  Event Monitor 05/30/13 No AFib  TEE 03/14/13 - EF 60-65%. - Mild MR - No LA or LAA clot - No RA clot. - Atrial septum: small patent foramen ovale. Redundant intraatrial septum.  Carotid US 03/10/13 Bilateral: 1-39% ICA stenosis  Echocardiogram 03/09/13 - EF 55-60%. Wall motion was normal - Mitral valve: Mild regurgitation. - Left atrium: The atrium was mildly dilated.   Past Medical History  Diagnosis Date  . Diverticulitis   . Carpal tunnel syndrome of left wrist   . Colon polyp 2007  . Allergy   . Cerebral aneurysm, nonruptured     2-3 mm in size, stable  . Insomnia   . Gastroesophageal reflux disease   .  Restless leg syndrome   . PONV (postoperative nausea and vomiting)   . Hypertension   . Family history of anesthesia complication     "brother had PONV"  . High cholesterol   . Stroke 02/2013    denies residual on 07/10/2013)  . Arthritis     "all over" (07/10/2013)    Past Surgical History  Procedure Laterality Date  . Cholecystectomy    . Breast surgery    . Tee without cardioversion N/A 03/14/2013    Procedure: TRANSESOPHAGEAL ECHOCARDIOGRAM (TEE);  Surgeon: Candee Furbish, MD;  Location: [facility];  Service: Cardiovascular;  Laterality: N/A;  . Total knee arthroplasty Left 07/10/2013  . Knee arthroscopy Bilateral   . Joint replacement    . Vaginal hysterectomy  2000's  . Total knee arthroplasty Left 07/10/2013    Procedure: TOTAL KNEE ARTHROPLASTY;  Surgeon: Garald Balding, MD;  Location: [facility];  Service: Orthopedics;  Laterality: Left;     Current Outpatient Prescriptions  Medication Sig Dispense Refill  . acetaminophen (TYLENOL) 500 MG tablet Take 1,000 mg by mouth every 6 (six) hours as needed for mild pain.    Marland Kitchen aspirin 325 MG tablet Take 325 mg by mouth daily.    . cholecalciferol (VITAMIN D) 1000 UNITS tablet Take 2,000 Units by mouth daily.    Marland Kitchen  clonazePAM (KLONOPIN) 0.5 MG tablet Take 1 tablet at bedtime for restless legs 90 tablet 1  . fenofibrate micronized (LOFIBRA) 134 MG capsule TAKE 1 CAPSULE (134 MG TOTAL) BY MOUTH DAILY BEFORE BREAKFAST. 90 capsule 1  . Methylcellulose, Laxative, (FIBER THERAPY PO) Take 2 tablets by mouth every morning.    . Multiple Vitamins-Minerals (PRESERVISION AREDS 2 PO) Take by mouth. Takes 2 tablets 2 times daily    . omeprazole (PRILOSEC) 20 MG capsule Take 1 capsule (20 mg total) by mouth daily. 90 capsule 3  . ramipril (ALTACE) 2.5 MG capsule Take 1 capsule (2.5 mg total) by mouth daily. 90 capsule 1   No current facility-administered medications for this visit.    Allergies:   Codeine    Social History:  The patient  reports  that she has never smoked. She has never used smokeless tobacco. She reports that she does not drink alcohol or use illicit drugs.   Family History:  The patient's family history includes Cancer in her brother, father, maternal grandmother, and sister; Diabetes in her paternal aunt; Heart attack in her brother; Heart disease (age of onset: 69) in her brother; Hypertension in her father. There is no history of Stroke.    ROS:   Please see the history of present illness.   Review of Systems  Eyes: Positive for visual disturbance.  Cardiovascular: Positive for chest pain and leg swelling.  Respiratory: Positive for shortness of breath.   Musculoskeletal: Positive for myalgias.  All other systems reviewed and are negative.    PHYSICAL EXAM: VS:  BP 138/72 mmHg  Pulse 68  Ht 5' 6.5" (1.689 m)  Wt 233 lb (105.688 kg)  BMI 37.05 kg/m2    Wt Readings from Last 3 Encounters:  06/11/14 233 lb (105.688 kg)  11/26/13 221 lb 12.8 oz (100.608 kg)  07/10/13 226 lb (102.513 kg)     GEN: Well nourished, well developed, in no acute distress HEENT: normal Neck: no JVD, no carotid bruits, no masses Cardiac:  Normal S1/S2, RRR; no murmur, no rubs or gallops, no edema  Respiratory:  clear to auscultation bilaterally, no wheezing, rhonchi or rales. GI: soft, nontender, nondistended, + BS MS: no deformity or atrophy Skin: warm and dry  Neuro:  CNs II-XII intact, Strength and sensation are intact Psych: Normal affect   EKG:  EKG is ordered today.  It demonstrates:   NSR, HR 68, LAD, low voltage, NSSTTW changes, no change from prior tracing.   Recent Labs: 11/26/2013: ALT 18; BUN 18; Creatinine 0.8; Hemoglobin 12.5; Platelets 302.0; Potassium 4.1; Sodium 142    Lipid Panel    Component Value Date/Time   CHOL 133 11/26/2013 0955   TRIG 138.0 11/26/2013 0955   HDL 43.00 11/26/2013 0955   CHOLHDL 3 11/26/2013 0955   VLDL 27.6 11/26/2013 0955   LDLCALC 62 11/26/2013 0955      ASSESSMENT  AND PLAN:  1.  Chest Pain:  She has symptoms of exertional chest pain that is concerning for CCS Class 2-3 angina.  She has never had symptoms like this. ECG is unchanged.  She has no signs or symptoms of CHF.  She has some risk factors for CAD.    -  Recommend proceeding with cardiac catheterization.  Reviewed with Dr. Jenkins Rouge (DOD) who agreed.  Risks and benefits of cardiac catheterization have been discussed with the patient.  These include bleeding, infection, kidney damage, stroke, heart attack, death.  The patient understands these risks and is willing  to proceed.     -  Check DDimer.  If abnormal, she will need Chest CTA to rule ou pulmonary embolism    -  Start Toprol-XL 25 mg QD.  Give Rx for prn NTG.    -  Continue ASA.    -  Go to ED if worse symptoms.  2.  HTN:  BP controlled.  Continue Altace.  Add Toprol as noted above for antianginal Rx. 3.  Hx of CVA:  Continue ASA. 4.  Hyperlipidemia:  Continue current Rx.  Managed by PCP.    Current medicines are reviewed at length with the patient today.  The patient does not have concerns regarding medicines.  The following changes have been made:  As above.   Labs/ tests ordered today include:  Orders Placed This Encounter  Procedures  . Basic Metabolic Panel (BMET)  . CBC w/Diff  . INR/PT  . D-Dimer, Quantitative  . EKG 12-Lead    Disposition:   FU with Dr. Candee Furbish or me 2 weeks after her cath.    Signed, Versie Starks, MHS 06/11/2014 11:21 AM    [facility] [address], [place], [place]  [address] Phone: [phone]; Fax: [phone]

## 2014-06-12 ENCOUNTER — Ambulatory Visit (INDEPENDENT_AMBULATORY_CARE_PROVIDER_SITE_OTHER)
Admission: RE | Admit: 2014-06-12 | Discharge: 2014-06-12 | Disposition: A | Discharge status: Home / Self Care | Payer: Medicare HMO | Source: Ambulatory Visit | Attending: Physician Assistant | Admitting: Physician Assistant

## 2014-06-12 DIAGNOSIS — R7989 Other specified abnormal findings of blood chemistry: Secondary | ICD-10-CM

## 2014-06-12 DIAGNOSIS — R791 Abnormal coagulation profile: Secondary | ICD-10-CM

## 2014-06-12 MED ORDER — IOHEXOL 350 MG/ML SOLN
75.0000 mL | Freq: Once | INTRAVENOUS | Status: AC | PRN
  Administered 2014-06-12: 75 mL via INTRAVENOUS

## 2014-06-17 ENCOUNTER — Telehealth: Payer: Self-pay | Admitting: Endocrinology

## 2014-06-17 ENCOUNTER — Other Ambulatory Visit: Payer: Self-pay | Admitting: Endocrinology

## 2014-06-17 NOTE — Telephone Encounter (Signed)
Please see below and advise, I tried calling patient but could only leave a message.

## 2014-06-17 NOTE — Telephone Encounter (Signed)
Not my Rx.

## 2014-06-17 NOTE — Telephone Encounter (Signed)
Medication was Fenofibrate, not phenobarbatol, I instructed her to call her insurance company to see what is preferred.

## 2014-06-17 NOTE — Telephone Encounter (Signed)
Pt needs alternate of the phenobarbatol

## 2014-06-17 NOTE — Telephone Encounter (Signed)
Pt calling you back

## 2014-06-19 ENCOUNTER — Ambulatory Visit: Payer: Commercial Managed Care - HMO | Admitting: Physician Assistant

## 2014-06-20 ENCOUNTER — Ambulatory Visit (HOSPITAL_COMMUNITY)
Admission: RE | Admit: 2014-06-20 | Discharge: 2014-06-20 | Disposition: A | Discharge status: Home / Self Care | Payer: Medicare HMO | Source: Ambulatory Visit | Attending: Cardiology | Admitting: Cardiology

## 2014-06-20 ENCOUNTER — Encounter (HOSPITAL_COMMUNITY)
Admission: RE | Disposition: A | Discharge status: Home / Self Care | Payer: Self-pay | Source: Ambulatory Visit | Attending: Cardiology

## 2014-06-20 ENCOUNTER — Encounter (HOSPITAL_COMMUNITY): Payer: Self-pay | Admitting: Cardiology

## 2014-06-20 DIAGNOSIS — E780 Pure hypercholesterolemia: Secondary | ICD-10-CM | POA: Insufficient documentation

## 2014-06-20 DIAGNOSIS — Z8249 Family history of ischemic heart disease and other diseases of the circulatory system: Secondary | ICD-10-CM | POA: Insufficient documentation

## 2014-06-20 DIAGNOSIS — Z6834 Body mass index (BMI) 34.0-34.9, adult: Secondary | ICD-10-CM | POA: Diagnosis not present

## 2014-06-20 DIAGNOSIS — Z79899 Other long term (current) drug therapy: Secondary | ICD-10-CM | POA: Insufficient documentation

## 2014-06-20 DIAGNOSIS — I208 Other forms of angina pectoris: Secondary | ICD-10-CM | POA: Diagnosis not present

## 2014-06-20 DIAGNOSIS — I209 Angina pectoris, unspecified: Secondary | ICD-10-CM

## 2014-06-20 DIAGNOSIS — Z7982 Long term (current) use of aspirin: Secondary | ICD-10-CM | POA: Insufficient documentation

## 2014-06-20 DIAGNOSIS — Z8673 Personal history of transient ischemic attack (TIA), and cerebral infarction without residual deficits: Secondary | ICD-10-CM | POA: Insufficient documentation

## 2014-06-20 DIAGNOSIS — Z9089 Acquired absence of other organs: Secondary | ICD-10-CM | POA: Diagnosis not present

## 2014-06-20 DIAGNOSIS — E781 Pure hyperglyceridemia: Secondary | ICD-10-CM | POA: Diagnosis not present

## 2014-06-20 DIAGNOSIS — R079 Chest pain, unspecified: Secondary | ICD-10-CM

## 2014-06-20 DIAGNOSIS — M1990 Unspecified osteoarthritis, unspecified site: Secondary | ICD-10-CM | POA: Diagnosis not present

## 2014-06-20 DIAGNOSIS — G4700 Insomnia, unspecified: Secondary | ICD-10-CM | POA: Diagnosis not present

## 2014-06-20 DIAGNOSIS — E669 Obesity, unspecified: Secondary | ICD-10-CM | POA: Diagnosis not present

## 2014-06-20 DIAGNOSIS — K219 Gastro-esophageal reflux disease without esophagitis: Secondary | ICD-10-CM | POA: Insufficient documentation

## 2014-06-20 DIAGNOSIS — G2581 Restless legs syndrome: Secondary | ICD-10-CM | POA: Insufficient documentation

## 2014-06-20 DIAGNOSIS — I10 Essential (primary) hypertension: Secondary | ICD-10-CM | POA: Diagnosis not present

## 2014-06-20 HISTORY — PX: LEFT HEART CATHETERIZATION WITH CORONARY ANGIOGRAM: SHX5451

## 2014-06-20 SURGERY — LEFT HEART CATHETERIZATION WITH CORONARY ANGIOGRAM
Anesthesia: LOCAL

## 2014-06-20 MED ORDER — MIDAZOLAM HCL 2 MG/2ML IJ SOLN
INTRAMUSCULAR | Status: AC
  Filled 2014-06-20: qty 2

## 2014-06-20 MED ORDER — ASPIRIN 81 MG PO CHEW
81.0000 mg | CHEWABLE_TABLET | ORAL | Status: AC
  Administered 2014-06-20: 81 mg via ORAL

## 2014-06-20 MED ORDER — SODIUM CHLORIDE 0.9 % IJ SOLN: 3.0000 mL | INTRAMUSCULAR | Status: DC | PRN

## 2014-06-20 MED ORDER — VERAPAMIL HCL 2.5 MG/ML IV SOLN
INTRAVENOUS | Status: AC
  Filled 2014-06-20: qty 2

## 2014-06-20 MED ORDER — SODIUM CHLORIDE 0.9 % IV SOLN: 1.0000 mL/kg/h | INTRAVENOUS | Status: DC

## 2014-06-20 MED ORDER — FENTANYL CITRATE 0.05 MG/ML IJ SOLN
INTRAMUSCULAR | Status: AC
  Filled 2014-06-20: qty 2

## 2014-06-20 MED ORDER — SODIUM CHLORIDE 0.9 % IV SOLN: 250.0000 mL | INTRAVENOUS | Status: DC | PRN

## 2014-06-20 MED ORDER — LIDOCAINE HCL (PF) 1 % IJ SOLN
INTRAMUSCULAR | Status: AC
  Filled 2014-06-20: qty 30

## 2014-06-20 MED ORDER — SODIUM CHLORIDE 0.9 % IV SOLN
INTRAVENOUS | Status: DC
  Administered 2014-06-20: 12:00:00 via INTRAVENOUS

## 2014-06-20 MED ORDER — ASPIRIN 81 MG PO CHEW
CHEWABLE_TABLET | ORAL | Status: AC
  Administered 2014-06-20: 81 mg via ORAL
  Filled 2014-06-20: qty 1

## 2014-06-20 MED ORDER — HEPARIN SODIUM (PORCINE) 1000 UNIT/ML IJ SOLN
INTRAMUSCULAR | Status: AC
  Filled 2014-06-20: qty 1

## 2014-06-20 MED ORDER — SODIUM CHLORIDE 0.9 % IJ SOLN: 3.0000 mL | Freq: Two times a day (BID) | INTRAMUSCULAR | Status: DC

## 2014-06-20 MED ORDER — NITROGLYCERIN 1 MG/10 ML FOR IR/CATH LAB
INTRA_ARTERIAL | Status: AC
  Filled 2014-06-20: qty 10

## 2014-06-20 MED ORDER — HEPARIN (PORCINE) IN NACL 2-0.9 UNIT/ML-% IJ SOLN
INTRAMUSCULAR | Status: AC
  Filled 2014-06-20: qty 1000

## 2014-06-20 NOTE — Discharge Instructions (Signed)
Radial Site Care °Refer to this sheet in the next few weeks. These instructions provide you with information on caring for yourself after your procedure. Your caregiver may also give you more specific instructions. Your treatment has been planned according to current medical practices, but problems sometimes occur. Call your caregiver if you have any problems or questions after your procedure. °HOME CARE INSTRUCTIONS °· You may shower the day after the procedure. Remove the bandage (dressing) and gently wash the site with plain soap and water. Gently pat the site dry. °· Do not apply powder or lotion to the site. °· Do not submerge the affected site in water for 3 to 5 days. °· Inspect the site at least twice daily. °· Do not flex or bend the affected arm for 24 hours. °· No lifting over 5 pounds (2.3 kg) for 5 days after your procedure. °· Do not drive home if you are discharged the same day of the procedure. Have someone else drive you. °· You may drive 24 hours after the procedure unless otherwise instructed by your caregiver. °· Do not operate machinery or power tools for 24 hours. °· A responsible adult should be with you for the first 24 hours after you arrive home. °What to expect: °· Any bruising will usually fade within 1 to 2 weeks. °· Blood that collects in the tissue (hematoma) may be painful to the touch. It should usually decrease in size and tenderness within 1 to 2 weeks. °SEEK IMMEDIATE MEDICAL CARE IF: °· You have unusual pain at the radial site. °· You have redness, warmth, swelling, or pain at the radial site. °· You have drainage (other than a small amount of blood on the dressing). °· You have chills. °· You have a fever or persistent symptoms for more than 72 hours. °· You have a fever and your symptoms suddenly get worse. °· Your arm becomes pale, cool, tingly, or numb. °· You have heavy bleeding from the site. Hold pressure on the site. °Document Released: 05/01/2010 Document Revised:  06/21/2011 Document Reviewed: 05/01/2010 °Exit[employer]® Patient Information ©2015 [employer]. This information is not intended to replace advice given to you by your health care provider. Make sure you discuss any questions you have with your health care provider. ° °

## 2014-06-20 NOTE — CV Procedure (Signed)
    CARDIAC CATHETERIZATION  PROCEDURE:  Left heart catheterization with selective coronary angiography, left ventriculogram via the radial artery approach.  INDICATIONS:  73 with anginal symptoms. See Richardson Dopp note for details.   The risks, benefits, and details of the procedure were explained to the patient, including possibilities of stroke, heart attack, death, renal impairment, arterial damage, bleeding.  The patient verbalized understanding and wanted to proceed.  Informed written consent was obtained.  PROCEDURE TECHNIQUE:  Allen's test was performed pre-and post procedure and was normal. The right radial artery site was prepped and draped in a sterile fashion. One percent lidocaine was used for local anesthesia. Using the modified Seldinger technique a 5 French hydrophilic sheath was inserted into the radial artery without difficulty. 3 mg of verapamil was administered via the sheath. A Judkins right #4 catheter with the guidance of a Versicore wire was placed in the right coronary cusp and selectively cannulated the right coronary artery. After traversing the aortic arch, 5000 units of heparin IV was administered. A Judkins left #3.5 catheter was used to selectively cannulate the left main artery. Multiple views with hand injection of Omnipaque were obtained. Catheter a pigtail catheter was used to cross into the left ventricle, hemodynamics were obtained, and a left ventriculogram was performed in the RAO position with power injection. Following the procedure, sheath was removed, patient was hemodynamically stable, hemostasis was maintained with a Terumo T band.   CONTRAST:  Total of 70 ml.    FLOUROSCOPY TIME: 4.9 min.  COMPLICATIONS:  None.    HEMODYNAMICS:  Aortic pressure was 734/19FXTK; LV systolic pressure was 240XBDZ; LVEDP 39mmHg.  There was no gradient between the left ventricle and aorta.    ANGIOGRAPHIC DATA:    Left main: No angiographically significant CAD.  Left  anterior descending (LAD): No angiographically significant CAD. Acute takeoff of LAD no stenosis.   Circumflex artery (CIRC): No angiographically significant CAD. Large system, 2 OM's  Right coronary artery (RCA): No angiographically significant CAD. Dominant vessel.   LEFT VENTRICULOGRAM:  Left ventricular angiogram was done in the 30 RAO projection and revealed normal left ventricular wall motion and systolic function with an estimated ejection fraction of 60%.   IMPRESSIONS:  No angiographically significant CAD Normal left ventricular systolic function.  LVEDP 19 mmHg.  Ejection fraction 60%.  RECOMMENDATION:  Medical mgt. Control BP.

## 2014-06-20 NOTE — Progress Notes (Signed)
TR BAND REMOVAL  LOCATION:    right radial  DEFLATED PER PROTOCOL:    Yes.    TIME BAND OFF / DRESSING APPLIED:    1615   SITE UPON ARRIVAL:    Level 0  SITE AFTER BAND REMOVAL:    Level 0  CIRCULATION SENSATION AND MOVEMENT:    Within Normal Limits   Yes.    COMMENTS:   TRB REMOVED/ TEGADERM DSG APPLIED

## 2014-06-20 NOTE — Interval H&P Note (Signed)
History and Physical Interval Note:  06/20/2014 1:26 PM  Aimee Hood  has presented today for surgery, with the diagnosis of excertional angina  The various methods of treatment have been discussed with the patient and family. After consideration of risks, benefits and other options for treatment, the patient has consented to  Procedure(s): LEFT HEART CATHETERIZATION WITH CORONARY ANGIOGRAM (N/A) as a surgical intervention .  The patient's history has been reviewed, patient examined, no change in status, stable for surgery.  I have reviewed the patient's chart and labs.  Questions were answered to the patient's satisfaction.     SKAINS, MARK

## 2014-07-01 ENCOUNTER — Ambulatory Visit (INDEPENDENT_AMBULATORY_CARE_PROVIDER_SITE_OTHER): Payer: Medicare HMO | Admitting: Endocrinology

## 2014-07-01 ENCOUNTER — Encounter: Payer: Self-pay | Admitting: Endocrinology

## 2014-07-01 VITALS — BP 140/80 | HR 68 | Temp 97.9°F | Resp 16 | Ht 66.5 in | Wt 234.6 lb

## 2014-07-01 DIAGNOSIS — Z23 Encounter for immunization: Secondary | ICD-10-CM

## 2014-07-01 DIAGNOSIS — I10 Essential (primary) hypertension: Secondary | ICD-10-CM

## 2014-07-01 DIAGNOSIS — E785 Hyperlipidemia, unspecified: Secondary | ICD-10-CM

## 2014-07-01 DIAGNOSIS — K219 Gastro-esophageal reflux disease without esophagitis: Secondary | ICD-10-CM

## 2014-07-01 NOTE — Patient Instructions (Signed)
Check BP weekly 

## 2014-07-01 NOTE — Progress Notes (Signed)
Patient ID: Aimee Hood, female   DOB: Nov 13, 1940, 74 y.o.      History of Present Illness:   Hypertension:   Has been on treatment with Ramipril for hypertension since about 2003. Blood pressure has usually been very well controlled.  She is taking 2.5 mg of ramipril for about a year now Although blood pressure is relatively high today she thinks it is consistently good at home Home blood pressure checks: has been checking periodically and recently was 128/78  Hyperlipidemia:  Lipid abnormalities Have been high triglycerides, highest level of 426 in 2007 and also high LDL, up to 156.  With treatment her triglycerides have been as low as 57 and LDL down to 31.   When fenofibrate was stopped her triglycerides started increasing along with LDL and this was restarted. Her triglycerides have been normal in 2015  Lab Results  Component Value Date   CHOL 133 11/26/2013   HDL 43.00 11/26/2013   LDLCALC 62 11/26/2013   TRIG 138.0 11/26/2013   CHOLHDL 3 11/26/2013          Past Medical History  Diagnosis Date  . Diverticulitis   . Carpal tunnel syndrome of left wrist   . Colon polyp 2007  . Allergy   . Cerebral aneurysm, nonruptured     3-4 mm in size, stable since 2004 (most recent MRI 2014)  . Insomnia   . Gastroesophageal reflux disease   . Restless leg syndrome   . PONV (postoperative nausea and vomiting)   . Hypertension   . Family history of anesthesia complication     "brother had PONV"  . High cholesterol   . Stroke 02/2013    denies residual on 07/10/2013)  . Arthritis     "all over" (07/10/2013)    Past Surgical History  Procedure Laterality Date  . Cholecystectomy    . Breast surgery    . Tee without cardioversion N/A 03/14/2013    Procedure: TRANSESOPHAGEAL ECHOCARDIOGRAM (TEE);  Surgeon: Aimee Furbish, MD;  Location: [facility];  Service: Cardiovascular;  Laterality: N/A;  . Total knee arthroplasty Left 07/10/2013  . Knee arthroscopy Bilateral   . Joint  replacement    . Vaginal hysterectomy  2000's  . Total knee arthroplasty Left 07/10/2013    Procedure: TOTAL KNEE ARTHROPLASTY;  Surgeon: Aimee Balding, MD;  Location: [facility];  Service: Orthopedics;  Laterality: Left;  . Left heart catheterization with coronary angiogram N/A 06/20/2014    Procedure: LEFT HEART CATHETERIZATION WITH CORONARY ANGIOGRAM;  Surgeon: Aimee Pain, MD;  Location: [facility] CATH LAB;  Service: Cardiovascular;  Laterality: N/A;    Family History  Problem Relation Age of Onset  . Heart disease Brother 74  . Cancer Brother     lung  . Diabetes Paternal Aunt   . Cancer Maternal Grandmother   . Cancer Father     lung  . Hypertension Father   . Cancer Sister   . Heart attack Brother   . Stroke Neg Hx     Social History:  reports that she has never smoked. She has never used smokeless tobacco. She reports that she does not drink alcohol or use illicit drugs.  Allergies:  Allergies  Allergen Reactions  . Nickel     Discovered allergy post knee surgery  . Codeine Rash      Medication List       This list is accurate as of: 07/01/14  8:25 AM.  Always use your most recent med list.  acetaminophen 500 MG tablet  Commonly known as:  TYLENOL  Take 1,000 mg by mouth every 6 (six) hours as needed for mild Hood.     aspirin 325 MG tablet  Take 325 mg by mouth daily.     cholecalciferol 1000 UNITS tablet  Commonly known as:  VITAMIN D  Take 2,000 Units by mouth daily.     clonazePAM 0.5 MG tablet  Commonly known as:  KLONOPIN  Take 1 tablet at bedtime for restless legs     fenofibrate micronized 134 MG capsule  Commonly known as:  LOFIBRA  TAKE 1 CAPSULE (134 MG TOTAL) BY MOUTH DAILY BEFORE BREAKFAST.     fenofibrate micronized 134 MG capsule  Commonly known as:  LOFIBRA  TAKE 1 CAPSULE (134 MG TOTAL) BY MOUTH DAILY BEFORE BREAKFAST.     metoprolol succinate 25 MG 24 hr tablet  Commonly known as:  TOPROL-XL  Take 1 tablet (25 mg total)  by mouth daily.     nitroGLYCERIN 0.4 MG SL tablet  Commonly known as:  NITROSTAT  Place 1 tablet (0.4 mg total) under the tongue every 5 (five) minutes as needed for chest Hood.     omeprazole 20 MG capsule  Commonly known as:  PRILOSEC  Take 1 capsule (20 mg total) by mouth daily.     ramipril 2.5 MG capsule  Commonly known as:  ALTACE  Take 1 capsule (2.5 mg total) by mouth daily.         REVIEW OF SYSTEMS:         She had recent chest Hood and a cardiac catheterization was negative.  However she has been placed on metoprolol and is due to have follow-up this week  She has to be seen by her PCP locally as indicated by her insurance   Weight history: She has gained weight since last year, she thinks this is from stress and not exercising  Wt Readings from Last 3 Encounters:  07/01/14 234 lb 9.6 oz (106.414 kg)  06/11/14 233 lb (105.688 kg)  11/26/13 221 lb 12.8 oz (100.608 kg)     PHYSICAL EXAM:  BP 169/82 mmHg  Pulse 68  Temp(Src) 97.9 F (36.6 C)  Resp 16  Ht 5' 6.5" (1.689 m)  Wt 234 lb 9.6 oz (106.414 kg)  BMI 37.30 kg/m2  SpO2 97%  Repeat blood pressure 140/80 No pedal edema present   ASSESSMENT/PLAN :   HYPERTENSION: Blood pressure is relatively higher today and not clear why.  She thinks that is much better at home Since she will be seeing her new PCP in about 2 months will defer any change in medication until then Also she will be seeing the cardiologist this week, not clear if she needs to be on metoprolol  Hyperlipidemia: We will recheck labs in August.   Encouraged her to start working on weight loss also  Immunizations: She will need that the tetanus booster today     Aimee Hood 07/01/2014, 8:25 AM

## 2014-07-02 NOTE — Progress Notes (Signed)
Cardiology Office Note   Date:  07/03/2014   ID:  Aimee Hood, Aimee Hood 06-22-1940, MRN [id number]  PCP:  Yong Channel, MD  Cardiologist:  Dr. Candee Furbish     Chief Complaint  Patient presents with  . Fatigue     History of Present Illness: Aimee Hood is a 74 y.o. female with a hx of HTN, HL with high triglycerides, obesity, DJD.  Evaluated by Dr. Candee Furbish in 02/2013 for surgical clearance prior to L TKR.  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  TEE was neg for source of embolus and event monitor did not demonstrate AFib.  She eventually had her L TKR 07/2013.  She called in recently with an episode of chest pain and was added on to my schedule today for evaluation. Her symptoms occurred with mild to mod exertion (CCS 2-3).  We elected to arrange cardiac cath.  This demonstrated normal coronary arteries.  DDimer was elevated and a Chest CTA was neg for pulmonary embolism.  She returns for FU.  She denies further chest pain, shortness of breath, syncope, orthopnea, PND. She does have some dependent pedal edema without significant change. Her biggest complaint is fatigue.   Studies/Reports Reviewed Today:  Cardiac cath 06/20/14 LM: No angiographically significant CAD. LAD: No angiographically significant CAD.  LCx: No angiographically significant CAD.  Right coronary artery (RCA): No angiographically significant CAD.  EF:  60%. LVEDP 19 mmHg  Chest CTA 06/12/14 IMPRESSION: No evidence of pulmonary emboli. Changes of prior granulomatous disease. No other focal abnormality is seen.  Event Monitor 05/30/13 No AFib  TEE 03/14/13 - EF 60-65%. - Mild MR - No LA or LAA clot - No RA clot. - Atrial septum: small patent foramen ovale. Redundant intraatrial septum.  Carotid US 03/10/13 Bilateral: 1-39% ICA stenosis  Echocardiogram 03/09/13 - EF 55-60%. Wall motion was normal - Mitral valve: Mild regurgitation. - Left atrium: The atrium was mildly  dilated.   Past Medical History  Diagnosis Date  . Diverticulitis   . Carpal tunnel syndrome of left wrist   . Colon polyp 2007  . Allergy   . Cerebral aneurysm, nonruptured     3-4 mm in size, stable since 2004 (most recent MRI 2014)  . Insomnia   . Gastroesophageal reflux disease   . Restless leg syndrome   . PONV (postoperative nausea and vomiting)   . Hypertension   . Family history of anesthesia complication     "brother had PONV"  . High cholesterol   . Stroke 02/2013    denies residual on 07/10/2013)  . Arthritis     "all over" (07/10/2013)    Past Surgical History  Procedure Laterality Date  . Cholecystectomy    . Breast surgery    . Tee without cardioversion N/A 03/14/2013    Procedure: TRANSESOPHAGEAL ECHOCARDIOGRAM (TEE);  Surgeon: Candee Furbish, MD;  Location: [facility];  Service: Cardiovascular;  Laterality: N/A;  . Total knee arthroplasty Left 07/10/2013  . Knee arthroscopy Bilateral   . Joint replacement    . Vaginal hysterectomy  2000's  . Total knee arthroplasty Left 07/10/2013    Procedure: TOTAL KNEE ARTHROPLASTY;  Surgeon: Garald Balding, MD;  Location: [facility];  Service: Orthopedics;  Laterality: Left;  . Left heart catheterization with coronary angiogram N/A 06/20/2014    Procedure: LEFT HEART CATHETERIZATION WITH CORONARY ANGIOGRAM;  Surgeon: [name], MD;  Location: [facility] CATH LAB;  Service: Cardiovascular;  Laterality: N/A;  Current Outpatient Prescriptions  Medication Sig Dispense Refill  . acetaminophen (TYLENOL) 500 MG tablet Take 1,000 mg by mouth every 6 (six) hours as needed for mild pain.    Marland Kitchen aspirin 325 MG tablet Take 325 mg by mouth daily.    . cholecalciferol (VITAMIN D) 1000 UNITS tablet Take 2,000 Units by mouth daily.    . clonazePAM (KLONOPIN) 0.5 MG tablet Take 1 tablet at bedtime for restless legs 90 tablet 1  . fenofibrate micronized (LOFIBRA) 134 MG capsule TAKE 1 CAPSULE (134 MG TOTAL) BY MOUTH DAILY BEFORE BREAKFAST. 90  capsule 1  . metoprolol succinate (TOPROL-XL) 25 MG 24 hr tablet Take 1 tablet (25 mg total) by mouth daily. 30 tablet 11  . nitroGLYCERIN (NITROSTAT) 0.4 MG SL tablet Place 1 tablet (0.4 mg total) under the tongue every 5 (five) minutes as needed for chest pain. 25 tablet 3  . omeprazole (PRILOSEC) 20 MG capsule Take 1 capsule (20 mg total) by mouth daily. (Patient taking differently: Take 20 mg by mouth daily as needed (for indigestion). ) 90 capsule 3  . ramipril (ALTACE) 2.5 MG capsule Take 1 capsule (2.5 mg total) by mouth daily. 90 capsule 1   No current facility-administered medications for this visit.    Allergies:   Nickel and Codeine    Social History:  The patient  reports that she has never smoked. She has never used smokeless tobacco. She reports that she does not drink alcohol or use illicit drugs.   Family History:  The patient's family history includes Cancer in her brother, father, maternal grandmother, and sister; Diabetes in her paternal aunt; Heart attack in her brother; Heart disease (age of onset: 95) in her brother; Hypertension in her father. There is no history of Stroke.    ROS:   Please see the history of present illness.   Review of Systems  Constitution: Positive for malaise/fatigue.  Respiratory: Positive for snoring.   All other systems reviewed and are negative.    PHYSICAL EXAM: VS:  BP 138/65 mmHg  Pulse 58  Ht 5' 6.5" (1.689 m)  Wt 256 lb (116.121 kg)  BMI 40.71 kg/m2    Wt Readings from Last 3 Encounters:  07/03/14 256 lb (116.121 kg)  07/01/14 234 lb 9.6 oz (106.414 kg)  06/11/14 233 lb (105.688 kg)     GEN: Well nourished, well developed, in no acute distress HEENT: normal Neck: no JVD, no carotid bruits, no masses Cardiac:  Normal S1/S2, RRR; no murmur, no rubs or gallops, no edema, right wrist without hematoma or mass  Respiratory:  clear to auscultation bilaterally, no wheezing, rhonchi or rales. GI: soft, nontender, nondistended, +  BS MS: no deformity or atrophy Skin: warm and dry  Neuro:  CNs II-XII intact, Strength and sensation are intact Psych: Normal affect   EKG:  EKG is ordered today.  It demonstrates:   Sinus bradycardia, HR 58, low voltage, poor R-wave progression, no change from prior tracing   Recent Labs: 11/26/2013: ALT 18 06/11/2014: BUN 17; Creatinine 0.94; Hemoglobin 12.9; Platelets 317.0; Potassium 4.3; Sodium 140    Lipid Panel    Component Value Date/Time   CHOL 133 11/26/2013 0955   TRIG 138.0 11/26/2013 0955   HDL 43.00 11/26/2013 0955   CHOLHDL 3 11/26/2013 0955   VLDL 27.6 11/26/2013 0955   LDLCALC 62 11/26/2013 0955      ASSESSMENT AND PLAN:  1.  Chest Pain:  Resolved.  Cardiac cath was normal. Chest CT was  neg for PE.  No further workup.  2.  HTN:  BP controlled.  Continue ACE inhibitor  3.  Hx of CVA:  Continue ASA.   4.  Hyperlipidemia:   Continue current Rx.  Managed by PCP.  5.  Fatigue: She does have a history of snoring.    -  Arrange split night sleep study.    -  Stop beta blocker.    -  Check TSH.   Current medicines are reviewed at length with the patient today.  The patient has concerns regarding medicines.  The following changes have been made:    Stop beta blocker (Toprol-XL)  Increase Altace to 5 mg daily.    Labs/ tests ordered today include:  Orders Placed This Encounter  Procedures  . Basic Metabolic Panel (BMET)  . TSH  . EKG 12-Lead  . Split night study    Disposition:   FU with Dr. Candee Furbish as needed   Signed, Versie Starks, MHS 07/03/2014 10:55 AM    [address], [place], [place]  [address] Phone: [phone]; Fax: [phone]

## 2014-07-03 ENCOUNTER — Ambulatory Visit (INDEPENDENT_AMBULATORY_CARE_PROVIDER_SITE_OTHER): Payer: Medicare HMO | Admitting: Physician Assistant

## 2014-07-03 ENCOUNTER — Encounter: Payer: Self-pay | Admitting: Physician Assistant

## 2014-07-03 VITALS — BP 138/65 | HR 58 | Ht 66.5 in | Wt 256.0 lb

## 2014-07-03 DIAGNOSIS — I10 Essential (primary) hypertension: Secondary | ICD-10-CM | POA: Diagnosis not present

## 2014-07-03 DIAGNOSIS — R079 Chest pain, unspecified: Secondary | ICD-10-CM

## 2014-07-03 DIAGNOSIS — E785 Hyperlipidemia, unspecified: Secondary | ICD-10-CM | POA: Diagnosis not present

## 2014-07-03 DIAGNOSIS — R0683 Snoring: Secondary | ICD-10-CM

## 2014-07-03 DIAGNOSIS — I639 Cerebral infarction, unspecified: Secondary | ICD-10-CM

## 2014-07-03 DIAGNOSIS — R5383 Other fatigue: Secondary | ICD-10-CM

## 2014-07-03 MED ORDER — RAMIPRIL 5 MG PO CAPS: 5.0000 mg | ORAL_CAPSULE | Freq: Every day | ORAL | Status: DC

## 2014-07-03 NOTE — Patient Instructions (Signed)
1. INCREASE ALTACE TO 5 MG DAILY; NEW RX SENT IN TODAY  2. DECREASE TOPROL XL TO 1/2 TAB FOR 3 DAYS THEN STOP  3. FOLLOW UP WITH DR. Marlou Porch OR SCOTT WEAVER, PAC AS NEEDED  4. YOU WILL NEED A SPLIT NIGHT SLEEP STUDY; DX SNORING, FATIGUE  5 .MONITOR BP FOR THE NEXT 2 WEEKS AND CALL WITH READINGS TO [place], PAC [phone]  6. LAB WORK TO BE DONE IN 1 WEEK; BMET, TSH

## 2014-07-10 ENCOUNTER — Other Ambulatory Visit (INDEPENDENT_AMBULATORY_CARE_PROVIDER_SITE_OTHER): Payer: Medicare HMO | Admitting: *Deleted

## 2014-07-10 DIAGNOSIS — R5383 Other fatigue: Secondary | ICD-10-CM

## 2014-07-10 DIAGNOSIS — I10 Essential (primary) hypertension: Secondary | ICD-10-CM | POA: Diagnosis not present

## 2014-07-10 LAB — BASIC METABOLIC PANEL
BUN: 19 mg/dL (ref 6–23)
CALCIUM: 9.6 mg/dL (ref 8.4–10.5)
CO2: 29 mEq/L (ref 19–32)
CREATININE: 0.97 mg/dL (ref 0.40–1.20)
Chloride: 107 mEq/L (ref 96–112)
GFR: 60.04 mL/min (ref >60.00)
GLUCOSE: 81 mg/dL (ref 70–99)
Potassium: 4.2 mEq/L (ref 3.5–5.1)
Sodium: 139 mEq/L (ref 135–145)

## 2014-07-10 LAB — TSH: TSH: 2.71 u[IU]/mL (ref 0.35–4.50)

## 2014-07-12 ENCOUNTER — Telehealth: Payer: Self-pay | Admitting: Physician Assistant

## 2014-07-12 NOTE — Telephone Encounter (Signed)
New message     Patient calling back to speak with nurse . Stated she will call back due to traveling this am.

## 2014-07-12 NOTE — Telephone Encounter (Signed)
pt notified about lab results with verbal understanding  

## 2014-08-02 ENCOUNTER — Other Ambulatory Visit: Payer: Self-pay | Admitting: Endocrinology

## 2014-09-08 ENCOUNTER — Encounter (HOSPITAL_BASED_OUTPATIENT_CLINIC_OR_DEPARTMENT_OTHER): Payer: Medicare HMO

## 2014-11-13 ENCOUNTER — Other Ambulatory Visit: Payer: Medicare HMO

## 2014-11-18 ENCOUNTER — Ambulatory Visit: Payer: Medicare HMO | Admitting: Endocrinology

## 2015-01-30 ENCOUNTER — Other Ambulatory Visit: Payer: Self-pay | Admitting: Endocrinology

## 2015-05-14 DIAGNOSIS — H353131 Nonexudative age-related macular degeneration, bilateral, early dry stage: Secondary | ICD-10-CM | POA: Diagnosis not present

## 2015-05-14 DIAGNOSIS — H04123 Dry eye syndrome of bilateral lacrimal glands: Secondary | ICD-10-CM | POA: Diagnosis not present

## 2015-05-14 DIAGNOSIS — H26493 Other secondary cataract, bilateral: Secondary | ICD-10-CM | POA: Diagnosis not present

## 2015-06-17 DIAGNOSIS — E785 Hyperlipidemia, unspecified: Secondary | ICD-10-CM | POA: Diagnosis not present

## 2015-06-17 DIAGNOSIS — F411 Generalized anxiety disorder: Secondary | ICD-10-CM | POA: Diagnosis not present

## 2015-06-17 DIAGNOSIS — I10 Essential (primary) hypertension: Secondary | ICD-10-CM | POA: Diagnosis not present

## 2015-06-18 DIAGNOSIS — I10 Essential (primary) hypertension: Secondary | ICD-10-CM | POA: Diagnosis not present

## 2015-06-20 DIAGNOSIS — Z961 Presence of intraocular lens: Secondary | ICD-10-CM | POA: Diagnosis not present

## 2015-06-20 DIAGNOSIS — H26491 Other secondary cataract, right eye: Secondary | ICD-10-CM | POA: Diagnosis not present

## 2015-06-20 DIAGNOSIS — H353130 Nonexudative age-related macular degeneration, bilateral, stage unspecified: Secondary | ICD-10-CM | POA: Diagnosis not present

## 2015-06-20 DIAGNOSIS — H18412 Arcus senilis, left eye: Secondary | ICD-10-CM | POA: Diagnosis not present

## 2015-06-20 DIAGNOSIS — H26493 Other secondary cataract, bilateral: Secondary | ICD-10-CM | POA: Diagnosis not present

## 2015-06-20 DIAGNOSIS — H18411 Arcus senilis, right eye: Secondary | ICD-10-CM | POA: Diagnosis not present

## 2015-06-21 DIAGNOSIS — R8290 Unspecified abnormal findings in urine: Secondary | ICD-10-CM | POA: Diagnosis not present

## 2015-07-01 DIAGNOSIS — H26493 Other secondary cataract, bilateral: Secondary | ICD-10-CM | POA: Diagnosis not present

## 2015-07-08 DIAGNOSIS — H26492 Other secondary cataract, left eye: Secondary | ICD-10-CM | POA: Diagnosis not present

## 2015-07-10 DIAGNOSIS — I10 Essential (primary) hypertension: Secondary | ICD-10-CM | POA: Diagnosis not present

## 2015-07-10 DIAGNOSIS — R05 Cough: Secondary | ICD-10-CM | POA: Diagnosis not present

## 2015-07-10 DIAGNOSIS — J0110 Acute frontal sinusitis, unspecified: Secondary | ICD-10-CM | POA: Diagnosis not present

## 2015-07-15 DIAGNOSIS — H26493 Other secondary cataract, bilateral: Secondary | ICD-10-CM | POA: Diagnosis not present

## 2015-07-28 DIAGNOSIS — H353131 Nonexudative age-related macular degeneration, bilateral, early dry stage: Secondary | ICD-10-CM | POA: Diagnosis not present

## 2015-08-25 DIAGNOSIS — H43813 Vitreous degeneration, bilateral: Secondary | ICD-10-CM | POA: Diagnosis not present

## 2015-08-25 DIAGNOSIS — H353132 Nonexudative age-related macular degeneration, bilateral, intermediate dry stage: Secondary | ICD-10-CM | POA: Diagnosis not present

## 2015-08-25 DIAGNOSIS — H538 Other visual disturbances: Secondary | ICD-10-CM | POA: Diagnosis not present

## 2015-09-01 DIAGNOSIS — H353132 Nonexudative age-related macular degeneration, bilateral, intermediate dry stage: Secondary | ICD-10-CM | POA: Diagnosis not present

## 2015-09-01 DIAGNOSIS — H538 Other visual disturbances: Secondary | ICD-10-CM | POA: Diagnosis not present

## 2015-12-02 DIAGNOSIS — K5730 Diverticulosis of large intestine without perforation or abscess without bleeding: Secondary | ICD-10-CM | POA: Diagnosis not present

## 2015-12-02 DIAGNOSIS — Z86010 Personal history of colonic polyps: Secondary | ICD-10-CM | POA: Diagnosis not present

## 2015-12-02 DIAGNOSIS — K219 Gastro-esophageal reflux disease without esophagitis: Secondary | ICD-10-CM | POA: Diagnosis not present

## 2015-12-02 DIAGNOSIS — Z1211 Encounter for screening for malignant neoplasm of colon: Secondary | ICD-10-CM | POA: Diagnosis not present

## 2015-12-23 DIAGNOSIS — Z1239 Encounter for other screening for malignant neoplasm of breast: Secondary | ICD-10-CM | POA: Diagnosis not present

## 2015-12-23 DIAGNOSIS — F411 Generalized anxiety disorder: Secondary | ICD-10-CM | POA: Diagnosis not present

## 2015-12-23 DIAGNOSIS — E785 Hyperlipidemia, unspecified: Secondary | ICD-10-CM | POA: Diagnosis not present

## 2015-12-23 DIAGNOSIS — Z6837 Body mass index (BMI) 37.0-37.9, adult: Secondary | ICD-10-CM | POA: Diagnosis not present

## 2015-12-23 DIAGNOSIS — I10 Essential (primary) hypertension: Secondary | ICD-10-CM | POA: Diagnosis not present

## 2015-12-23 DIAGNOSIS — K219 Gastro-esophageal reflux disease without esophagitis: Secondary | ICD-10-CM | POA: Diagnosis not present

## 2015-12-23 DIAGNOSIS — E669 Obesity, unspecified: Secondary | ICD-10-CM | POA: Diagnosis not present

## 2016-01-05 DIAGNOSIS — Z1211 Encounter for screening for malignant neoplasm of colon: Secondary | ICD-10-CM | POA: Diagnosis not present

## 2016-01-05 DIAGNOSIS — Z86010 Personal history of colonic polyps: Secondary | ICD-10-CM | POA: Diagnosis not present

## 2016-01-05 DIAGNOSIS — K635 Polyp of colon: Secondary | ICD-10-CM | POA: Diagnosis not present

## 2016-01-05 DIAGNOSIS — D122 Benign neoplasm of ascending colon: Secondary | ICD-10-CM | POA: Diagnosis not present

## 2016-01-05 DIAGNOSIS — D120 Benign neoplasm of cecum: Secondary | ICD-10-CM | POA: Diagnosis not present

## 2016-01-08 DIAGNOSIS — N63 Unspecified lump in breast: Secondary | ICD-10-CM | POA: Diagnosis not present

## 2016-01-20 DIAGNOSIS — L723 Sebaceous cyst: Secondary | ICD-10-CM | POA: Diagnosis not present

## 2016-01-20 DIAGNOSIS — I499 Cardiac arrhythmia, unspecified: Secondary | ICD-10-CM | POA: Diagnosis not present

## 2016-01-20 DIAGNOSIS — Z23 Encounter for immunization: Secondary | ICD-10-CM | POA: Diagnosis not present

## 2016-01-20 DIAGNOSIS — I493 Ventricular premature depolarization: Secondary | ICD-10-CM | POA: Diagnosis not present

## 2016-01-20 DIAGNOSIS — R002 Palpitations: Secondary | ICD-10-CM | POA: Diagnosis not present

## 2016-02-06 ENCOUNTER — Other Ambulatory Visit: Payer: Self-pay | Admitting: Endocrinology

## 2016-02-24 ENCOUNTER — Other Ambulatory Visit: Payer: Self-pay | Admitting: Endocrinology

## 2016-02-24 DIAGNOSIS — H43813 Vitreous degeneration, bilateral: Secondary | ICD-10-CM | POA: Diagnosis not present

## 2016-02-24 DIAGNOSIS — H538 Other visual disturbances: Secondary | ICD-10-CM | POA: Diagnosis not present

## 2016-02-24 DIAGNOSIS — H353132 Nonexudative age-related macular degeneration, bilateral, intermediate dry stage: Secondary | ICD-10-CM | POA: Diagnosis not present

## 2016-04-14 IMAGING — CT CT ANGIO CHEST
2 of 6 series · 19 of 36 positions shown · IV contrast (Omnipaque 300)
Comparison: None.

CLINICAL DATA: Increasing shortness of breath and left arm pain

EXAM:
CT ANGIOGRAPHY CHEST WITH CONTRAST
TECHNIQUE: Multidetector CT imaging of the chest was performed using the
standard protocol during bolus administration of intravenous
contrast. Multiplanar CT image reconstructions and MIPs were
obtained to evaluate the vascular anatomy.
CONTRAST:  75mL OMNIPAQUE IOHEXOL 350 MG/ML SOLN

[Series 5: thins (id) / (id) · axial · 0.68mm/px · z∈[-282,-44]mm · 18 of 266 slices shown]
[im 14/266  lung]
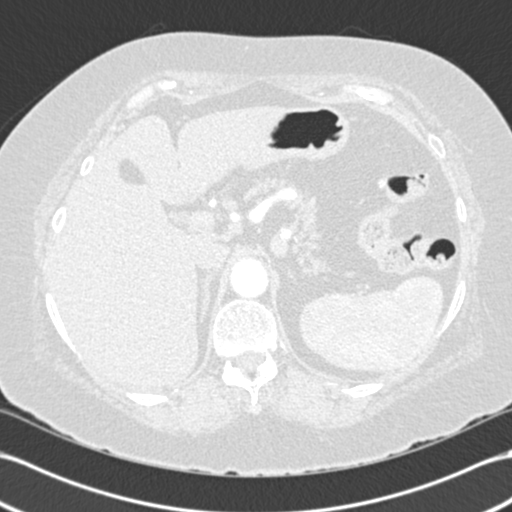
[im 27/266  mediastinal]
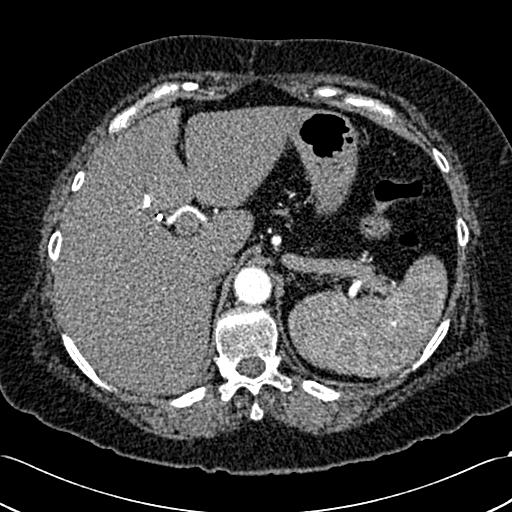
[im 40/266  lung]
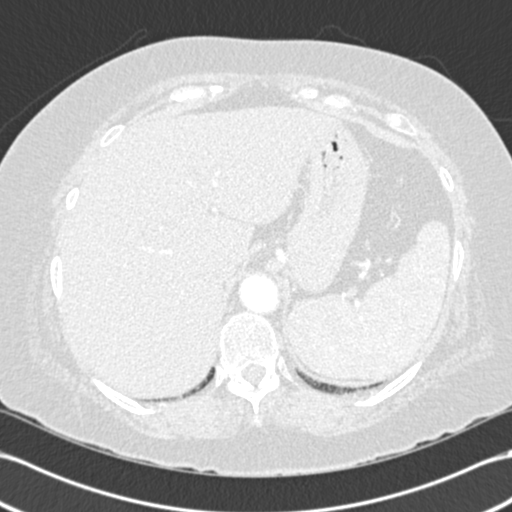
[im 54/266  mediastinal]
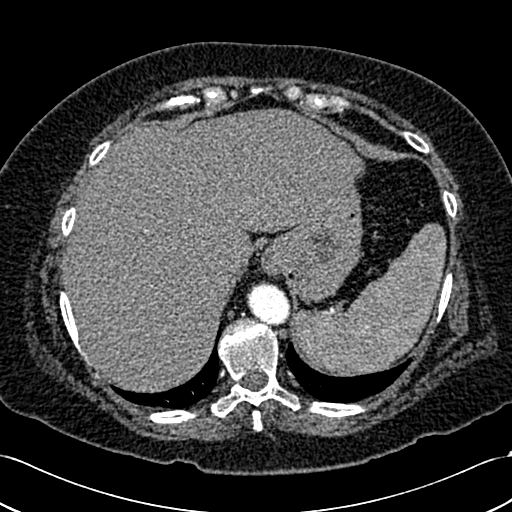
[im 67/266  lung]
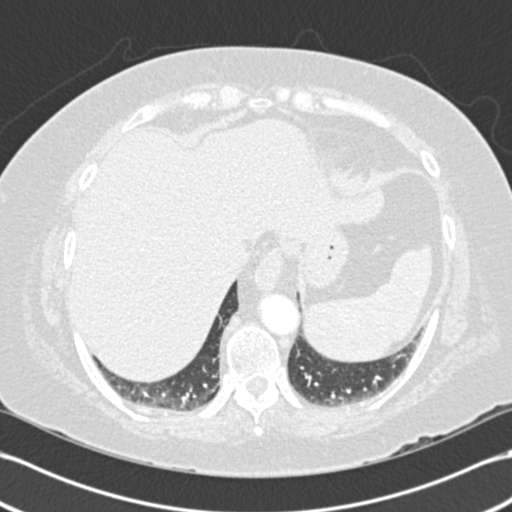
[im 80/266  mediastinal]
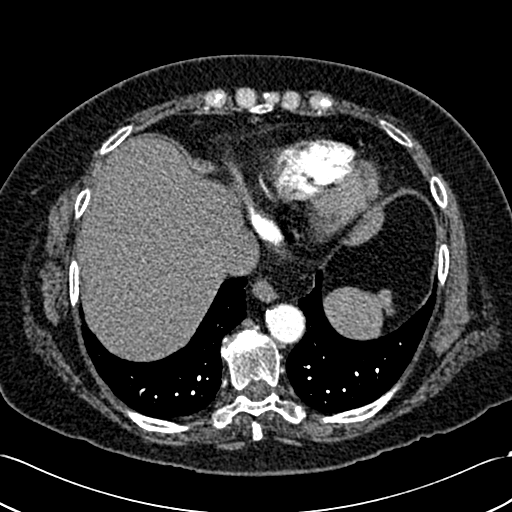
[im 93/266  lung]
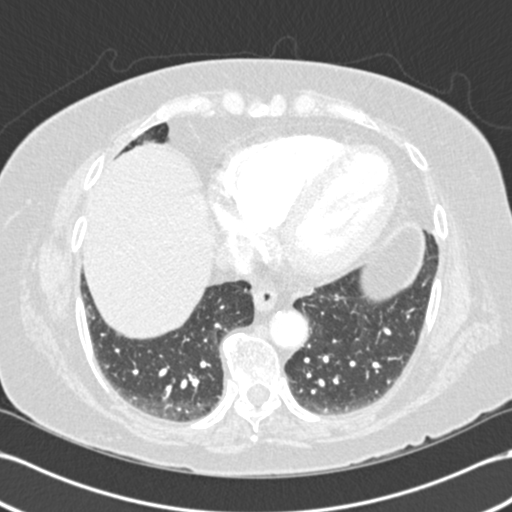
[im 107/266  mediastinal]
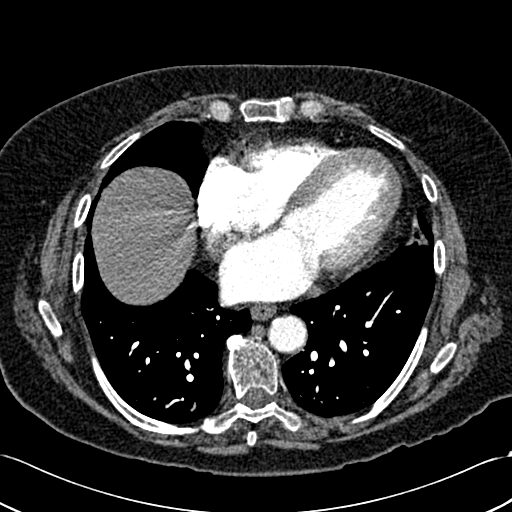
[im 120/266  lung]
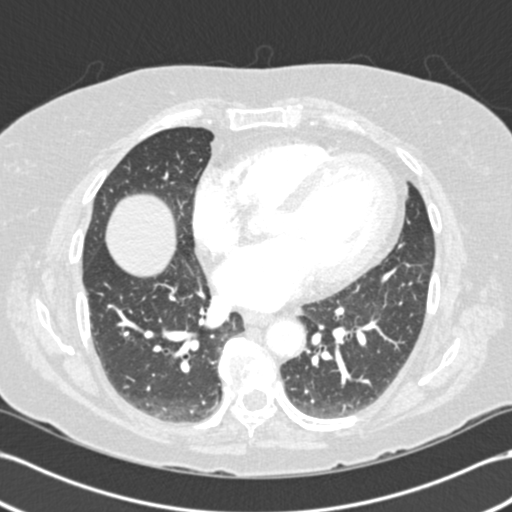
[im 146/266  mediastinal]
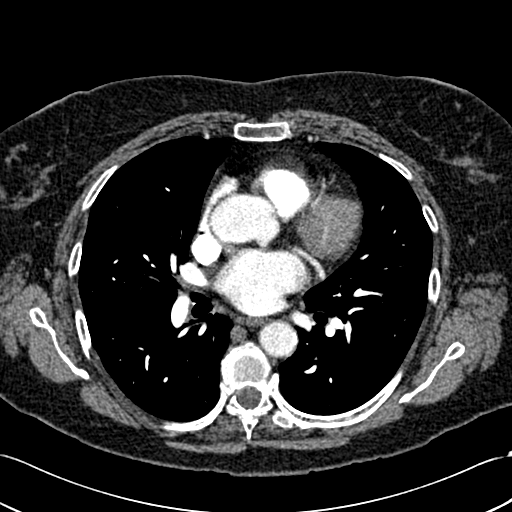
[im 160/266  lung]
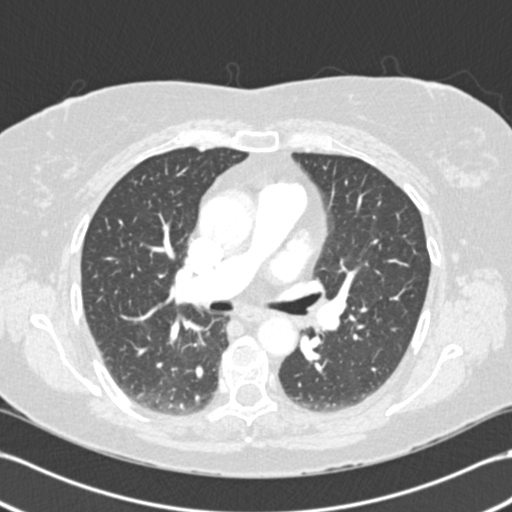
[im 173/266  mediastinal]
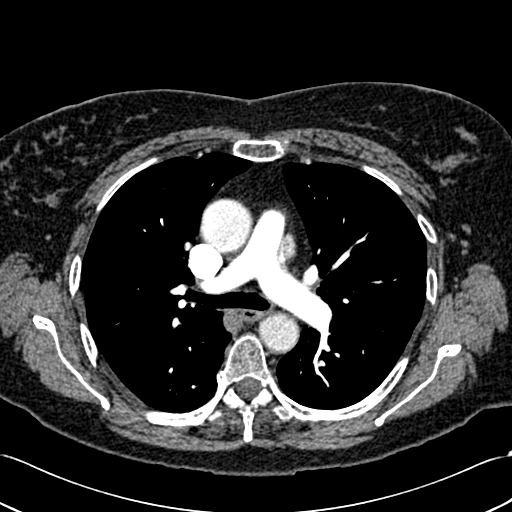
[im 186/266  lung]
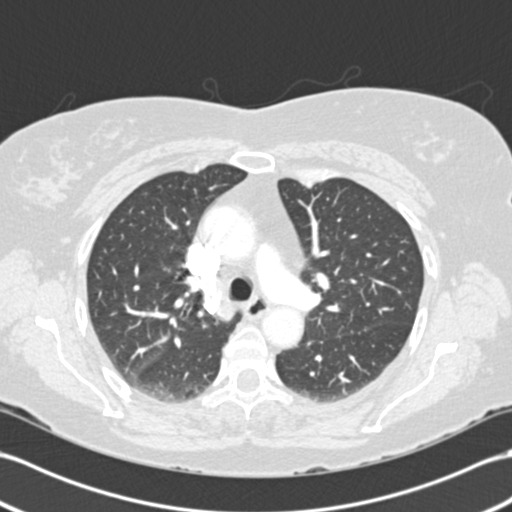
[im 199/266  mediastinal]
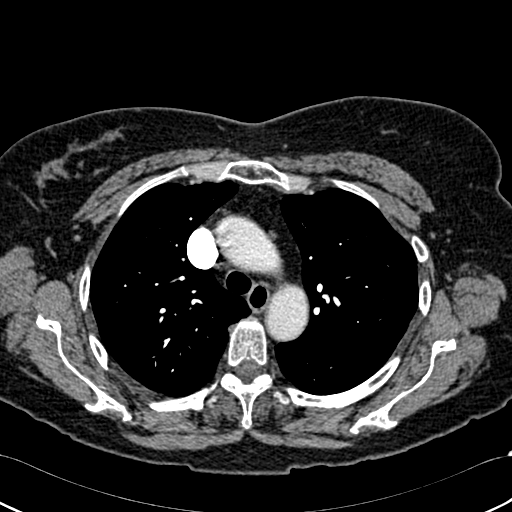
[im 213/266  lung]
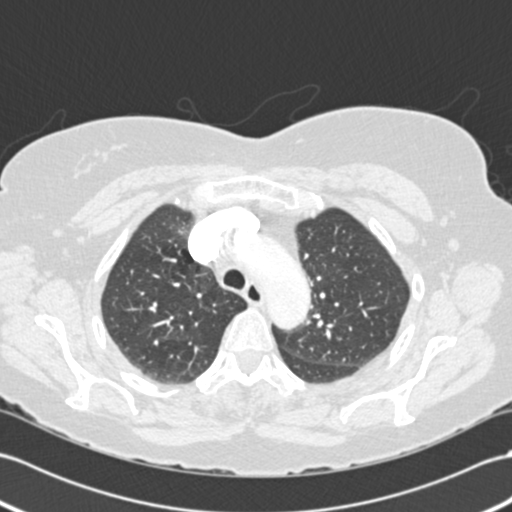
[im 226/266  mediastinal]
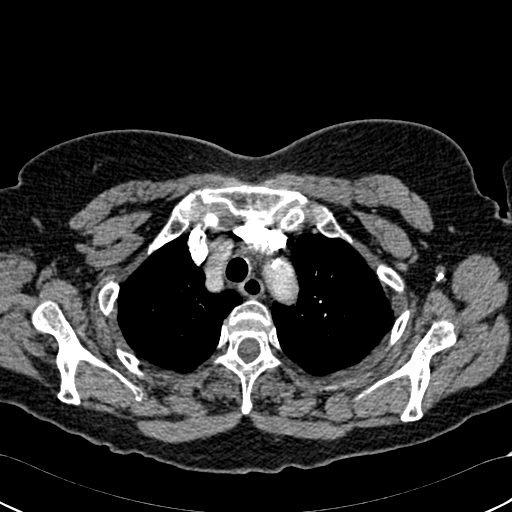
[im 239/266  lung]
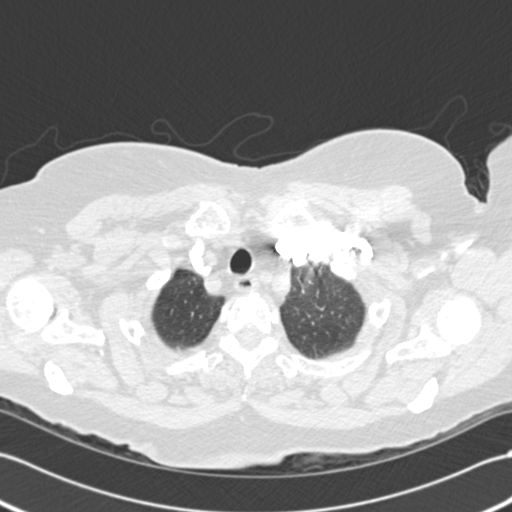
[im 252/266  mediastinal]
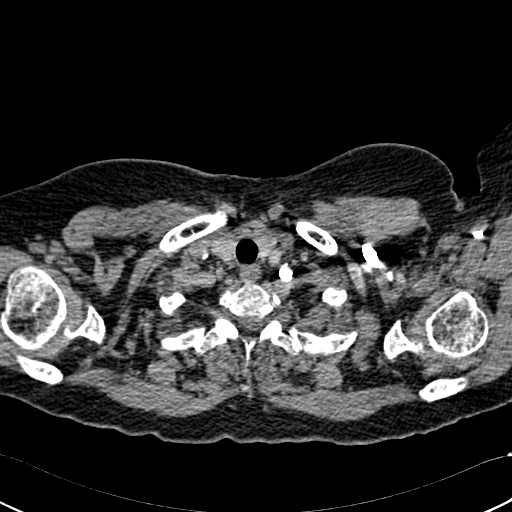

[Series 602: cor · coronal · 0.68mm/px · 1 of 119 slices shown]
[im 60/119  mediastinal]
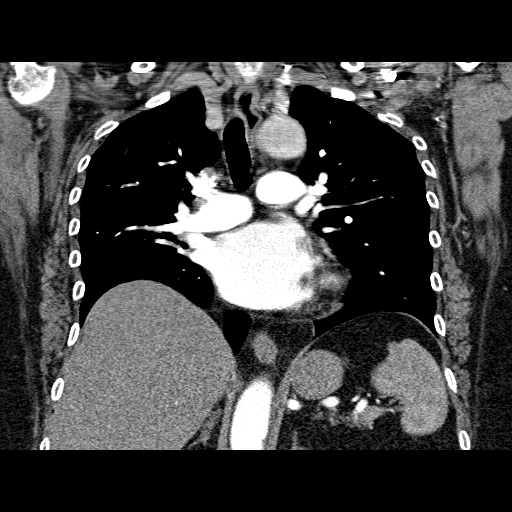

[19 of 36 positions shown; findings below may reference images not displayed]

FINDINGS: The lungs are well aerated bilaterally. A calcified granuloma is
noted in the anterior aspect of the right lower lobe best seen on
image number 43 of series 6. No sizable parenchymal nodule is noted.
No focal infiltrate is seen.

The thoracic inlet shows no focal abnormality. The thoracic aorta
and its branches are within normal limits with very mild
atherosclerotic calcification. No aneurysmal dilatation or
dissection is identified. The pulmonary artery shows a normal
branching pattern. No focal filling defect to suggest pulmonary
embolism is identified. No hilar or mediastinal adenopathy is seen.

Scanning into the upper abdomen reveals no acute abnormality. The
gallbladder has been surgically removed. The osseous structures show
degenerative change of the thoracic spine.

Review of the MIP images confirms the above findings.
IMPRESSION: No evidence of pulmonary emboli.

Changes of prior granulomatous disease.

No other focal abnormality is seen.

## 2016-05-03 DIAGNOSIS — H6121 Impacted cerumen, right ear: Secondary | ICD-10-CM | POA: Diagnosis not present

## 2016-05-03 DIAGNOSIS — F411 Generalized anxiety disorder: Secondary | ICD-10-CM | POA: Diagnosis not present

## 2016-05-03 DIAGNOSIS — Z0000 Encounter for general adult medical examination without abnormal findings: Secondary | ICD-10-CM | POA: Diagnosis not present

## 2016-05-03 DIAGNOSIS — E559 Vitamin D deficiency, unspecified: Secondary | ICD-10-CM | POA: Diagnosis not present

## 2016-05-03 DIAGNOSIS — K219 Gastro-esophageal reflux disease without esophagitis: Secondary | ICD-10-CM | POA: Diagnosis not present

## 2016-05-03 DIAGNOSIS — L309 Dermatitis, unspecified: Secondary | ICD-10-CM | POA: Diagnosis not present

## 2016-05-03 DIAGNOSIS — I10 Essential (primary) hypertension: Secondary | ICD-10-CM | POA: Diagnosis not present

## 2016-05-03 DIAGNOSIS — E785 Hyperlipidemia, unspecified: Secondary | ICD-10-CM | POA: Diagnosis not present

## 2016-05-06 DIAGNOSIS — I10 Essential (primary) hypertension: Secondary | ICD-10-CM | POA: Diagnosis not present

## 2016-05-06 DIAGNOSIS — E559 Vitamin D deficiency, unspecified: Secondary | ICD-10-CM | POA: Diagnosis not present

## 2016-08-18 DIAGNOSIS — H43813 Vitreous degeneration, bilateral: Secondary | ICD-10-CM | POA: Diagnosis not present

## 2016-08-18 DIAGNOSIS — H35422 Microcystoid degeneration of retina, left eye: Secondary | ICD-10-CM | POA: Diagnosis not present

## 2016-08-18 DIAGNOSIS — H353132 Nonexudative age-related macular degeneration, bilateral, intermediate dry stage: Secondary | ICD-10-CM | POA: Diagnosis not present

## 2016-08-31 ENCOUNTER — Encounter (HOSPITAL_COMMUNITY)
Admission: EM | Disposition: A | Discharge status: Home / Self Care | Payer: Self-pay | Source: Home / Self Care | Attending: Cardiovascular Disease

## 2016-08-31 ENCOUNTER — Encounter (HOSPITAL_BASED_OUTPATIENT_CLINIC_OR_DEPARTMENT_OTHER): Payer: Self-pay | Admitting: Emergency Medicine

## 2016-08-31 ENCOUNTER — Inpatient Hospital Stay (HOSPITAL_BASED_OUTPATIENT_CLINIC_OR_DEPARTMENT_OTHER)
Admission: EM | Admit: 2016-08-31 | Discharge: 2016-09-02 | DRG: 281 | Disposition: A | Discharge status: Home / Self Care | Payer: PPO | Attending: Cardiovascular Disease | Admitting: Cardiovascular Disease

## 2016-08-31 ENCOUNTER — Emergency Department (HOSPITAL_BASED_OUTPATIENT_CLINIC_OR_DEPARTMENT_OTHER): Payer: PPO

## 2016-08-31 DIAGNOSIS — I214 Non-ST elevation (NSTEMI) myocardial infarction: Secondary | ICD-10-CM | POA: Diagnosis not present

## 2016-08-31 DIAGNOSIS — E669 Obesity, unspecified: Secondary | ICD-10-CM | POA: Diagnosis not present

## 2016-08-31 DIAGNOSIS — Z96652 Presence of left artificial knee joint: Secondary | ICD-10-CM | POA: Diagnosis not present

## 2016-08-31 DIAGNOSIS — R079 Chest pain, unspecified: Secondary | ICD-10-CM | POA: Diagnosis not present

## 2016-08-31 DIAGNOSIS — R7989 Other specified abnormal findings of blood chemistry: Secondary | ICD-10-CM | POA: Diagnosis present

## 2016-08-31 DIAGNOSIS — E781 Pure hyperglyceridemia: Secondary | ICD-10-CM | POA: Diagnosis not present

## 2016-08-31 DIAGNOSIS — Z6838 Body mass index (BMI) 38.0-38.9, adult: Secondary | ICD-10-CM | POA: Diagnosis not present

## 2016-08-31 DIAGNOSIS — I5181 Takotsubo syndrome: Secondary | ICD-10-CM | POA: Diagnosis not present

## 2016-08-31 DIAGNOSIS — I10 Essential (primary) hypertension: Secondary | ICD-10-CM | POA: Diagnosis not present

## 2016-08-31 DIAGNOSIS — R748 Abnormal levels of other serum enzymes: Secondary | ICD-10-CM | POA: Diagnosis not present

## 2016-08-31 DIAGNOSIS — I119 Hypertensive heart disease without heart failure: Secondary | ICD-10-CM | POA: Diagnosis present

## 2016-08-31 DIAGNOSIS — R0602 Shortness of breath: Secondary | ICD-10-CM

## 2016-08-31 DIAGNOSIS — I2510 Atherosclerotic heart disease of native coronary artery without angina pectoris: Secondary | ICD-10-CM | POA: Diagnosis not present

## 2016-08-31 DIAGNOSIS — Q211 Atrial septal defect: Secondary | ICD-10-CM | POA: Diagnosis not present

## 2016-08-31 DIAGNOSIS — R778 Other specified abnormalities of plasma proteins: Secondary | ICD-10-CM | POA: Diagnosis present

## 2016-08-31 DIAGNOSIS — Z8673 Personal history of transient ischemic attack (TIA), and cerebral infarction without residual deficits: Secondary | ICD-10-CM | POA: Diagnosis not present

## 2016-08-31 DIAGNOSIS — Z0389 Encounter for observation for other suspected diseases and conditions ruled out: Secondary | ICD-10-CM | POA: Diagnosis not present

## 2016-08-31 DIAGNOSIS — Z7982 Long term (current) use of aspirin: Secondary | ICD-10-CM

## 2016-08-31 DIAGNOSIS — E7800 Pure hypercholesterolemia, unspecified: Secondary | ICD-10-CM | POA: Diagnosis not present

## 2016-08-31 DIAGNOSIS — IMO0001 Reserved for inherently not codable concepts without codable children: Secondary | ICD-10-CM

## 2016-08-31 DIAGNOSIS — H3530 Unspecified macular degeneration: Secondary | ICD-10-CM

## 2016-08-31 DIAGNOSIS — I213 ST elevation (STEMI) myocardial infarction of unspecified site: Secondary | ICD-10-CM | POA: Diagnosis not present

## 2016-08-31 HISTORY — DX: Unspecified macular degeneration: H35.30

## 2016-08-31 HISTORY — DX: Atherosclerotic heart disease of native coronary artery without angina pectoris: I25.10

## 2016-08-31 LAB — TROPONIN I
TROPONIN I: 0.27 ng/mL — AB (ref <0.03)
Troponin I: 0.67 ng/mL (ref <0.03)
Troponin I: 3.11 ng/mL (ref <0.03)

## 2016-08-31 LAB — COMPREHENSIVE METABOLIC PANEL
ALK PHOS: 63 U/L (ref 38–126)
ALT: 14 U/L (ref 14–54)
ANION GAP: 7 (ref 5–15)
AST: 22 U/L (ref 15–41)
Albumin: 4.2 g/dL (ref 3.5–5.0)
BUN: 17 mg/dL (ref 6–20)
CO2: 26 mmol/L (ref 22–32)
Calcium: 9.5 mg/dL (ref 8.9–10.3)
Chloride: 106 mmol/L (ref 101–111)
Creatinine, Ser: 0.89 mg/dL (ref 0.44–1.00)
GFR calc Af Amer: >60 mL/min (ref >60)
GFR calc non Af Amer: >60 mL/min (ref >60)
Glucose, Bld: 105 mg/dL — ABNORMAL HIGH (ref 65–99)
POTASSIUM: 4 mmol/L (ref 3.5–5.1)
SODIUM: 139 mmol/L (ref 135–145)
Total Bilirubin: 0.6 mg/dL (ref 0.3–1.2)
Total Protein: 7 g/dL (ref 6.5–8.1)

## 2016-08-31 LAB — CBC WITH DIFFERENTIAL/PLATELET
BASOS ABS: 0 10*3/uL (ref 0.0–0.1)
BASOS PCT: 0 %
EOS ABS: 0.2 10*3/uL (ref 0.0–0.7)
Eosinophils Relative: 2 %
HCT: 40.2 % (ref 36.0–46.0)
HEMOGLOBIN: 13.3 g/dL (ref 12.0–15.0)
Lymphocytes Relative: 12 %
Lymphs Abs: 1.1 10*3/uL (ref 0.7–4.0)
MCH: 29.8 pg (ref 26.0–34.0)
MCHC: 33.1 g/dL (ref 30.0–36.0)
MCV: 90.1 fL (ref 78.0–100.0)
MONOS PCT: 9 %
Monocytes Absolute: 0.8 10*3/uL (ref 0.1–1.0)
NEUTROS PCT: 77 %
Neutro Abs: 6.8 10*3/uL (ref 1.7–7.7)
Platelets: 260 10*3/uL (ref 150–400)
RBC: 4.46 MIL/uL (ref 3.87–5.11)
RDW: 12.9 % (ref 11.5–15.5)
WBC: 8.9 10*3/uL (ref 4.0–10.5)

## 2016-08-31 LAB — D-DIMER, QUANTITATIVE: D-Dimer, Quant: 0.47 ug/mL-FEU (ref 0.00–0.50)

## 2016-08-31 LAB — HEPARIN LEVEL (UNFRACTIONATED): Heparin Unfractionated: 0.35 IU/mL (ref 0.30–0.70)

## 2016-08-31 LAB — I-STAT TROPONIN, ED: TROPONIN I, POC: 0.5 ng/mL — AB (ref 0.00–0.08)

## 2016-08-31 LAB — MRSA PCR SCREENING: MRSA by PCR: POSITIVE — AB

## 2016-08-31 LAB — PROTIME-INR
INR: 0.91
Prothrombin Time: 12.2 seconds (ref 11.4–15.2)

## 2016-08-31 SURGERY — LEFT HEART CATH AND CORONARY ANGIOGRAPHY
Anesthesia: LOCAL

## 2016-08-31 MED ORDER — SODIUM CHLORIDE 0.9 % IV SOLN
INTRAVENOUS | Status: DC
  Administered 2016-08-31: 10 mL/h via INTRAVENOUS

## 2016-08-31 MED ORDER — HEPARIN (PORCINE) IN NACL 100-0.45 UNIT/ML-% IJ SOLN
1300.0000 [IU]/h | INTRAMUSCULAR | Status: DC
  Administered 2016-08-31: 1100 [IU]/h via INTRAVENOUS
  Filled 2016-08-31 (×2): qty 250

## 2016-08-31 MED ORDER — RAMIPRIL 5 MG PO CAPS
5.0000 mg | ORAL_CAPSULE | Freq: Every day | ORAL | Status: DC
  Administered 2016-09-01 – 2016-09-02 (×2): 5 mg via ORAL
  Filled 2016-08-31 (×2): qty 1

## 2016-08-31 MED ORDER — ASPIRIN EC 81 MG PO TBEC
81.0000 mg | DELAYED_RELEASE_TABLET | Freq: Every day | ORAL | Status: DC
  Administered 2016-09-01 – 2016-09-02 (×2): 81 mg via ORAL
  Filled 2016-08-31 (×2): qty 1

## 2016-08-31 MED ORDER — PANTOPRAZOLE SODIUM 40 MG PO TBEC
40.0000 mg | DELAYED_RELEASE_TABLET | Freq: Every day | ORAL | Status: DC
Start: 2016-09-01 — End: 2016-09-02
  Administered 2016-09-01 – 2016-09-02 (×2): 40 mg via ORAL
  Filled 2016-08-31 (×2): qty 1

## 2016-08-31 MED ORDER — METOPROLOL SUCCINATE ER 25 MG PO TB24
25.0000 mg | ORAL_TABLET | Freq: Every day | ORAL | Status: DC
  Administered 2016-09-01: 25 mg via ORAL
  Filled 2016-08-31: qty 1

## 2016-08-31 MED ORDER — ACETAMINOPHEN 500 MG PO TABS: 1000.0000 mg | ORAL_TABLET | Freq: Four times a day (QID) | ORAL | Status: DC | PRN

## 2016-08-31 MED ORDER — ACETAMINOPHEN 325 MG PO TABS
650.0000 mg | ORAL_TABLET | ORAL | Status: DC | PRN
  Administered 2016-08-31: 650 mg via ORAL
  Filled 2016-08-31: qty 2

## 2016-08-31 MED ORDER — HEPARIN SODIUM (PORCINE) 5000 UNIT/ML IJ SOLN
4000.0000 [IU] | Freq: Once | INTRAMUSCULAR | Status: AC
  Administered 2016-08-31: 4000 [IU] via INTRAVENOUS

## 2016-08-31 MED ORDER — HEPARIN BOLUS VIA INFUSION
4000.0000 [IU] | Freq: Once | INTRAVENOUS | Status: AC
  Administered 2016-08-31: 4000 [IU] via INTRAVENOUS
  Filled 2016-08-31: qty 4000

## 2016-08-31 MED ORDER — NITROGLYCERIN 0.4 MG SL SUBL
0.4000 mg | SUBLINGUAL_TABLET | SUBLINGUAL | Status: DC | PRN
  Administered 2016-08-31 (×2): 0.4 mg via SUBLINGUAL

## 2016-08-31 MED ORDER — SODIUM CHLORIDE 0.9 % IV SOLN: 250.0000 mL | INTRAVENOUS | Status: DC | PRN

## 2016-08-31 MED ORDER — ONDANSETRON HCL 4 MG/2ML IJ SOLN
4.0000 mg | Freq: Four times a day (QID) | INTRAMUSCULAR | Status: DC | PRN
Start: 2016-08-31 — End: 2016-09-02

## 2016-08-31 MED ORDER — CLONAZEPAM 0.5 MG PO TABS
0.5000 mg | ORAL_TABLET | Freq: Every evening | ORAL | Status: DC | PRN
  Administered 2016-09-01: 0.5 mg via ORAL
  Filled 2016-08-31: qty 1

## 2016-08-31 MED ORDER — NITROGLYCERIN 2 % TD OINT
0.5000 [in_us] | TOPICAL_OINTMENT | Freq: Four times a day (QID) | TRANSDERMAL | Status: DC
  Administered 2016-08-31 – 2016-09-02 (×7): 0.5 [in_us] via TOPICAL
  Filled 2016-08-31 (×9): qty 30
  Filled 2016-08-31: qty 1
  Filled 2016-08-31 (×21): qty 30

## 2016-08-31 MED ORDER — FENOFIBRATE 54 MG PO TABS
54.0000 mg | ORAL_TABLET | Freq: Every day | ORAL | Status: DC
  Administered 2016-09-01 – 2016-09-02 (×2): 54 mg via ORAL
  Filled 2016-08-31 (×2): qty 1

## 2016-08-31 MED ORDER — ASPIRIN 81 MG PO CHEW: 324.0000 mg | CHEWABLE_TABLET | ORAL | Status: AC

## 2016-08-31 MED ORDER — ASPIRIN 300 MG RE SUPP: 300.0000 mg | RECTAL | Status: AC

## 2016-08-31 NOTE — ED Provider Notes (Signed)
[facility] DEPT Provider Note   CSN: [id number] Arrival date & time: 08/31/16  1024     History   Chief Complaint Chief Complaint  Patient presents with  . Chest Pain    HPI Aimee Hood is a 76 y.o. female.  Patient reports getting out of the shower. Had onset of tightness chest pain. Shortness of breath, nausea, diaphoresis. Went to outside hospital [facility]. EKG there showed concern for ST elevations in anterior leads with reciprocal changes in his inferior leads. Patient took aspirin 324 mg at home. She was given nitroglycerin at the outside hospital with improvement in symptoms. Transferred here. Still complaining of mild pain on transfer here.   The history is provided by the patient.  Illness  This is a new problem. The current episode started 3 to 5 hours ago. The problem occurs constantly. The problem has been gradually improving. Associated symptoms include chest pain and shortness of breath. Pertinent negatives include no abdominal pain and no headaches. Relieved by: nitro. The treatment provided moderate relief.    Past Medical History:  Diagnosis Date  . Allergy   . Arthritis    "all over" (07/10/2013)  . Carpal tunnel syndrome of left wrist   . Cerebral aneurysm, nonruptured    3-4 mm in size, stable since 2004 (most recent MRI 2014)  . Colon polyp 2007  . Diverticulitis   . Family history of anesthesia complication    "brother had PONV"  . Gastroesophageal reflux disease   . High cholesterol   . Hypertension   . Insomnia   . PONV (postoperative nausea and vomiting)   . Restless leg syndrome   . Stroke [facility]) 02/2013   denies residual on 07/10/2013)    Patient Active Problem List   Diagnosis Date Noted  . Chest pain with moderate risk of acute coronary syndrome 08/31/2016  . Normal coronary arteries-2016 08/31/2016  . Elevated troponin 08/31/2016  . Chest pain on exertion 06/11/2014  . Osteoarthritis of left knee 07/12/2013  . S/P total knee  replacement using cement 07/10/2013  . Aneurysm, cerebral, nonruptured 04/23/2013  . History of LMCA CVA-TPA 2014 03/09/2013  . Osteoarthritis 11/14/2012  . Restless legs syndrome 11/14/2012  . GERD (gastroesophageal reflux disease) 11/13/2012  . Essential hypertension 11/05/2012  . Other and unspecified hyperlipidemia 11/05/2012    Past Surgical History:  Procedure Laterality Date  . BREAST SURGERY    . CHOLECYSTECTOMY    . JOINT REPLACEMENT    . KNEE ARTHROSCOPY Bilateral   . LEFT HEART CATHETERIZATION WITH CORONARY ANGIOGRAM N/A 06/20/2014   Procedure: LEFT HEART CATHETERIZATION WITH CORONARY ANGIOGRAM;  Surgeon: [name], MD;  Location: [facility] CATH LAB;  Service: Cardiovascular;  Laterality: N/A;  . TEE WITHOUT CARDIOVERSION N/A 03/14/2013   Procedure: TRANSESOPHAGEAL ECHOCARDIOGRAM (TEE);  Surgeon: Candee Furbish, MD;  Location: [facility] ENDOSCOPY;  Service: Cardiovascular;  Laterality: N/A;  . TOTAL KNEE ARTHROPLASTY Left 07/10/2013  . TOTAL KNEE ARTHROPLASTY Left 07/10/2013   Procedure: TOTAL KNEE ARTHROPLASTY;  Surgeon: Garald Balding, MD;  Location: [facility];  Service: Orthopedics;  Laterality: Left;  Marland Kitchen VAGINAL HYSTERECTOMY  2000's    OB History    No data available       Home Medications    Prior to Admission medications   Medication Sig Start Date End Date Taking? Authorizing Provider  aspirin 325 MG tablet Take 325 mg by mouth daily.   Yes [provider]  cholecalciferol (VITAMIN D) 1000 UNITS tablet Take 2,000 Units  by mouth daily.   Yes [provider]  clonazePAM (KLONOPIN) 0.5 MG tablet Take 1 tablet at bedtime for restless legs 10/23/13  Yes Elayne Snare, MD  fenofibrate micronized (LOFIBRA) 134 MG capsule TAKE 1 CAPSULE (134 MG TOTAL) BY MOUTH DAILY BEFORE BREAKFAST. 06/17/14  Yes Elayne Snare, MD  metoprolol succinate (TOPROL-XL) 25 MG 24 hr tablet Take 1 tablet (25 mg total) by mouth daily. 06/11/14  Yes Weaver, Scott T, PA-C  nitroGLYCERIN (NITROSTAT) 0.4  MG SL tablet Place 1 tablet (0.4 mg total) under the tongue every 5 (five) minutes as needed for chest pain. 06/11/14  Yes Weaver, Scott T, PA-C  ramipril (ALTACE) 2.5 MG capsule TAKE 1 CAPSULE EVERY DAY 08/02/14  Yes Elayne Snare, MD  acetaminophen (TYLENOL) 500 MG tablet Take 1,000 mg by mouth every 6 (six) hours as needed for mild pain.    [provider]  omeprazole (PRILOSEC) 20 MG capsule Take 1 capsule (20 mg total) by mouth daily. Patient taking differently: Take 20 mg by mouth daily as needed (for indigestion).  04/23/14   Elayne Snare, MD  omeprazole (PRILOSEC) 20 MG capsule TAKE 1 CAPSULE (20 MG TOTAL) BY MOUTH DAILY. 01/30/15   Elayne Snare, MD  ramipril (ALTACE) 5 MG capsule Take 1 capsule (5 mg total) by mouth daily. 07/03/14   Liliane Shi, PA-C    Family History Family History  Problem Relation Age of Onset  . Heart disease Brother 66  . Cancer Brother        lung  . Cancer Father        lung  . Hypertension Father   . Cancer Sister   . Diabetes Paternal Aunt   . Cancer Maternal Grandmother   . Heart attack Brother   . Stroke Neg Hx     Social History Social History  Substance Use Topics  . Smoking status: Never Smoker  . Smokeless tobacco: Never Used  . Alcohol use No     Allergies   Nickel and Codeine   Review of Systems Review of Systems  Constitutional: Positive for diaphoresis. Negative for chills and fever.  Respiratory: Positive for shortness of breath. Negative for cough.   Cardiovascular: Positive for chest pain. Negative for palpitations and leg swelling.  Gastrointestinal: Positive for nausea. Negative for abdominal pain, diarrhea and vomiting.  Genitourinary: Negative for dysuria.  Musculoskeletal: Negative for back pain.  Neurological: Negative for light-headedness and headaches.  All other systems reviewed and are negative.    Physical Exam Updated Vital Signs BP 140/76   Pulse (!) 58   Temp 97.9 F (36.6 C) (Oral)   Resp 16    Ht 5\' 6"  (1.676 m)   Wt 107 kg (236 lb)   SpO2 100%   BMI 38.09 kg/m   Physical Exam  Constitutional: She appears well-developed and well-nourished. No distress.  HENT:  Head: Normocephalic and atraumatic.  Eyes: Conjunctivae are normal.  Neck: Neck supple.  Cardiovascular: Normal rate and regular rhythm.   No murmur heard. Pulmonary/Chest: Effort normal and breath sounds normal. No respiratory distress. She has no wheezes. She has no rhonchi.  Abdominal: Soft. There is no tenderness.  Musculoskeletal: She exhibits no edema.       Right lower leg: She exhibits no swelling and no edema.       Left lower leg: She exhibits no swelling and no edema.  Neurological: She is alert.  Skin: Skin is warm and dry.  Psychiatric: She has a normal mood  and affect.  Nursing note and vitals reviewed.    ED Treatments / Results  Labs (all labs ordered are listed, but only abnormal results are displayed) Labs Reviewed  COMPREHENSIVE METABOLIC PANEL - Abnormal; Notable for the following:       Result Value   Glucose, Bld 105 (*)    All other components within normal limits  TROPONIN I - Abnormal; Notable for the following:    Troponin I 0.27 (*)    All other components within normal limits  TROPONIN I - Abnormal; Notable for the following:    Troponin I 0.67 (*)    All other components within normal limits  I-STAT TROPOININ, ED - Abnormal; Notable for the following:    Troponin i, poc 0.50 (*)    All other components within normal limits  CBC WITH DIFFERENTIAL/PLATELET  PROTIME-INR  TROPONIN I  TROPONIN I  HEPARIN LEVEL (UNFRACTIONATED)    EKG  EKG Interpretation  Date/Time:  Tuesday Aug 31 2016 12:12:31 EDT Ventricular Rate:  65 PR Interval:    QRS Duration: 102 QT Interval:  463 QTC Calculation: 482 R Axis:   -13 Text Interpretation:  Sinus rhythm Multiple ventricular premature complexes Low voltage, precordial leads Since prior ECG, depressions are no longer present in V2  PVCs are new, no other significant changes Confirmed by [facility] MD, ERIN ([id number]) on 08/31/2016 12:40:44 PM       Radiology Dg Chest Port 1 View  Result Date: 08/31/2016 CLINICAL DATA:  LEFT side chest pain and heaviness, slight shortness of breath, code STEMI, hypertension, hyperlipidemia EXAM: PORTABLE CHEST 1 VIEW COMPARISON:  Portable exam 1050 hours compared to 07/02/2013 FINDINGS: Minimal enlargement of cardiac silhouette. Mild tortuosity of thoracic aorta. Mediastinal contours and pulmonary vascularity normal. Lungs grossly clear. No pleural effusion or pneumothorax. Bones demineralized. IMPRESSION: Minimal enlargement of cardiac silhouette. No acute abnormalities. Electronically Signed   By: Lavonia Dana M.D.   On: 08/31/2016 10:58    Procedures Procedures (including critical care time)  Medications Ordered in ED Medications  0.9 %  sodium chloride infusion (10 mL/hr Intravenous New Bag/Given 08/31/16 1250)  nitroGLYCERIN (NITROSTAT) SL tablet 0.4 mg (0.4 mg Sublingual Given 08/31/16 1108)  heparin ADULT infusion 100 units/mL (25000 units/244mL sodium chloride 0.45%) (1,100 Units/hr Intravenous New Bag/Given 08/31/16 1250)  aspirin chewable tablet 324 mg (324 mg Oral Not Given 08/31/16 1413)    Or  aspirin suppository 300 mg ( Rectal See Alternative 08/31/16 1413)  aspirin EC tablet 81 mg (not administered)  acetaminophen (TYLENOL) tablet 650 mg (not administered)  ondansetron (ZOFRAN) injection 4 mg (not administered)  0.9 %  sodium chloride infusion (not administered)  clonazePAM (KLONOPIN) tablet 0.5 mg (not administered)  fenofibrate tablet 54 mg (not administered)  metoprolol succinate (TOPROL-XL) 24 hr tablet 25 mg (not administered)  pantoprazole (PROTONIX) EC tablet 40 mg (not administered)  ramipril (ALTACE) capsule 5 mg (not administered)  nitroGLYCERIN (NITROGLYN) 2 % ointment 0.5 inch (0.5 inches Topical Given 08/31/16 1418)  heparin injection 4,000 Units (4,000 Units  Intravenous Given 08/31/16 1100)  heparin bolus via infusion 4,000 Units (4,000 Units Intravenous Bolus from Bag 08/31/16 1250)     Initial Impression / Assessment and Plan / ED Course  I have reviewed the triage vital signs and the nursing notes.  Pertinent labs & imaging results that were available during my care of the patient were reviewed by me and considered in my medical decision making (see chart for details).     Concerning story  for cardiac chest pain. Initially evaluated by cardiology here. Code STEMI was initially canceled. Troponin returned elevated. Repeat troponin continuing to climb. Heparin bolus was given at the outside hospital, initiated heparin here. Contacted cardiology again. Will admit to their service. No radiation into the back or tearing sensation. Doubt dissection. Normal vital signs here. No evidence of DVT. Doubt pulmonary embolus. No fevers or chills or infectious symptoms to indicate pneumonia.PNA.  Final Clinical Impressions(s) / ED Diagnoses   Final diagnoses:  ST elevation myocardial infarction (STEMI), unspecified artery [facility])    New Prescriptions Current Discharge Medication List       Maryan Puls, MD 08/31/16 1538    Gareth Morgan, MD 09/01/16 [id number]    Gareth Morgan, MD 09/01/16 0130

## 2016-08-31 NOTE — ED Notes (Signed)
Attempted to call cath lab to give report. Placed on hold and no one returned

## 2016-08-31 NOTE — Progress Notes (Signed)
Troponin 3.11, Now Bigeminy PVC's per telemetry. Notified on call cardio. Pt is currently CP free. Will do EKG and continue to monitor.

## 2016-08-31 NOTE — ED Provider Notes (Addendum)
[facility] DEPT [facility] Provider Note   CSN: [id number] Arrival date & time: 08/31/16  1024     History   Chief Complaint Chief Complaint  Patient presents with  . Chest Pain    HPI Aimee Hood is a 76 y.o. female.  HPI 76 year old female with history of hypertension, hyperlipidemia, stroke who presents with crushing chest pain. The patient states her symptoms started at 7:30 AM when she woke up and began preparing breakfast. She describes acute onset of an aching, dull, substernal chest pressure. The pressure radiates to her bilateral arms and elbows, worse on the left than the right. She has associated shortness of breath and diaphoresis. She has never had similar symptoms. No known history of coronary disease. She does have history of stroke and took a full dose of aspirin prior to arrival today. No alleviating factors.  Past Medical History:  Diagnosis Date  . Allergy   . Arthritis    "all over" (07/10/2013)  . Carpal tunnel syndrome of left wrist   . Cerebral aneurysm, nonruptured    3-4 mm in size, stable since 2004 (most recent MRI 2014)  . Colon polyp 2007  . Diverticulitis   . Family history of anesthesia complication    "brother had PONV"  . Gastroesophageal reflux disease   . High cholesterol   . Hypertension   . Insomnia   . PONV (postoperative nausea and vomiting)   . Restless leg syndrome   . Stroke [facility]) 02/2013   denies residual on 07/10/2013)    Patient Active Problem List   Diagnosis Date Noted  . Chest pain on exertion 06/11/2014  . Osteoarthritis of left knee 07/12/2013  . S/P total knee replacement using cement 07/10/2013  . Aneurysm, cerebral, nonruptured 04/23/2013  . Stroke ([place]) 03/09/2013  . Osteoarthritis 11/14/2012  . Restless legs syndrome 11/14/2012  . GERD (gastroesophageal reflux disease) 11/13/2012  . Unspecified essential hypertension 11/05/2012  . Other and unspecified hyperlipidemia 11/05/2012    Past Surgical History:    Procedure Laterality Date  . BREAST SURGERY    . CHOLECYSTECTOMY    . JOINT REPLACEMENT    . KNEE ARTHROSCOPY Bilateral   . LEFT HEART CATHETERIZATION WITH CORONARY ANGIOGRAM N/A 06/20/2014   Procedure: LEFT HEART CATHETERIZATION WITH CORONARY ANGIOGRAM;  Surgeon: [name], MD;  Location: [facility] CATH LAB;  Service: Cardiovascular;  Laterality: N/A;  . TEE WITHOUT CARDIOVERSION N/A 03/14/2013   Procedure: TRANSESOPHAGEAL ECHOCARDIOGRAM (TEE);  Surgeon: Candee Furbish, MD;  Location: [facility] ENDOSCOPY;  Service: Cardiovascular;  Laterality: N/A;  . TOTAL KNEE ARTHROPLASTY Left 07/10/2013  . TOTAL KNEE ARTHROPLASTY Left 07/10/2013   Procedure: TOTAL KNEE ARTHROPLASTY;  Surgeon: Garald Balding, MD;  Location: [place];  Service: Orthopedics;  Laterality: Left;  Marland Kitchen VAGINAL HYSTERECTOMY  2000's    OB History    No data available       Home Medications    Prior to Admission medications   Medication Sig Start Date End Date Taking? Authorizing Provider  aspirin 325 MG tablet Take 325 mg by mouth daily.   Yes [provider]  cholecalciferol (VITAMIN D) 1000 UNITS tablet Take 2,000 Units by mouth daily.   Yes [provider]  clonazePAM (KLONOPIN) 0.5 MG tablet Take 1 tablet at bedtime for restless legs 10/23/13  Yes Elayne Snare, MD  fenofibrate micronized (LOFIBRA) 134 MG capsule TAKE 1 CAPSULE (134 MG TOTAL) BY MOUTH DAILY BEFORE BREAKFAST. 06/17/14  Yes Elayne Snare, MD  metoprolol succinate (TOPROL-XL) 25  MG 24 hr tablet Take 1 tablet (25 mg total) by mouth daily. 06/11/14  Yes Weaver, Scott T, PA-C  nitroGLYCERIN (NITROSTAT) 0.4 MG SL tablet Place 1 tablet (0.4 mg total) under the tongue every 5 (five) minutes as needed for chest pain. 06/11/14  Yes Weaver, Scott T, PA-C  ramipril (ALTACE) 2.5 MG capsule TAKE 1 CAPSULE EVERY DAY 08/02/14  Yes Elayne Snare, MD  acetaminophen (TYLENOL) 500 MG tablet Take 1,000 mg by mouth every 6 (six) hours as needed for mild pain.    [provider]   omeprazole (PRILOSEC) 20 MG capsule Take 1 capsule (20 mg total) by mouth daily. Patient taking differently: Take 20 mg by mouth daily as needed (for indigestion).  04/23/14   Elayne Snare, MD  omeprazole (PRILOSEC) 20 MG capsule TAKE 1 CAPSULE (20 MG TOTAL) BY MOUTH DAILY. 01/30/15   Elayne Snare, MD  ramipril (ALTACE) 5 MG capsule Take 1 capsule (5 mg total) by mouth daily. 07/03/14   Liliane Shi, PA-C    Family History Family History  Problem Relation Age of Onset  . Heart disease Brother 47  . Cancer Brother        lung  . Cancer Father        lung  . Hypertension Father   . Cancer Sister   . Diabetes Paternal Aunt   . Cancer Maternal Grandmother   . Heart attack Brother   . Stroke Neg Hx     Social History Social History  Substance Use Topics  . Smoking status: Never Smoker  . Smokeless tobacco: Never Used  . Alcohol use No     Allergies   Nickel and Codeine   Review of Systems Review of Systems  Constitutional: Positive for diaphoresis and fatigue.  Respiratory: Positive for chest tightness and shortness of breath.   Cardiovascular: Positive for chest pain.  All other systems reviewed and are negative.    Physical Exam Updated Vital Signs BP 137/85 (BP Location: Right Arm)   Pulse 77   Temp 97.6 F (36.4 C) (Oral)   Resp 15   Ht 5\' 6"  (1.676 m)   Wt 107 kg (236 lb)   SpO2 100%   BMI 38.09 kg/m   Physical Exam  Constitutional: She is oriented to person, place, and time. She appears well-developed and well-nourished. She appears distressed.  HENT:  Head: Normocephalic and atraumatic.  Eyes: Conjunctivae are normal.  Neck: Neck supple.  Cardiovascular: Normal rate, regular rhythm and normal heart sounds.  Exam reveals no friction rub.   No murmur heard. 2+ radial and DP pulses bilaterally  Pulmonary/Chest: Effort normal and breath sounds normal. No respiratory distress. She has no wheezes. She has no rales.  Abdominal: Soft. She exhibits no  distension.  Musculoskeletal: She exhibits no edema.  Neurological: She is alert and oriented to person, place, and time. She exhibits normal muscle tone.  Skin: Skin is warm. Capillary refill takes less than 2 seconds.  Psychiatric: She has a normal mood and affect.  Nursing note reviewed.    ED Treatments / Results  Labs (all labs ordered are listed, but only abnormal results are displayed) Labs Reviewed  CBC WITH DIFFERENTIAL/PLATELET  COMPREHENSIVE METABOLIC PANEL  TROPONIN I  PROTIME-INR    EKG  EKG Interpretation None       Radiology No results found.  Procedures .Critical Care Performed by: Duffy Bruce Authorized by: Duffy Bruce   Critical care provider statement:    Critical care time (minutes):  35   (including critical care time)  CRITICAL CARE Performed by: Evonnie Pat   Total critical care time: 35 minutes  Critical care time was exclusive of separately billable procedures and treating other patients.  Critical care was necessary to treat or prevent imminent or life-threatening deterioration.  Critical care was time spent personally by me on the following activities: development of treatment plan with patient and/or surrogate as well as nursing, discussions with consultants, evaluation of patient's response to treatment, examination of patient, obtaining history from patient or surrogate, ordering and performing treatments and interventions, ordering and review of laboratory studies, ordering and review of radiographic studies, pulse oximetry and re-evaluation of patient's condition.    Medications Ordered in ED Medications  0.9 %  sodium chloride infusion (not administered)  heparin injection 4,000 Units (not administered)  nitroGLYCERIN (NITROSTAT) SL tablet 0.4 mg (not administered)     Initial Impression / Assessment and Plan / ED Course  I have reviewed the triage vital signs and the nursing notes.  Pertinent labs & imaging  results that were available during my care of the patient were reviewed by me and considered in my medical decision making (see chart for details).    76 year old female with history of stroke, hypertension, hyperlipidemia here with concerning story of chest pain. Symptoms began at 7:30 AM. Pain is crushing, substernal, with nausea and diaphoresis. EKG reviewed and shows new ST flattening and possible elevation in leads 3 and 4 lateral precordial leads. She has reciprocal changes in the lateral leads. Concern for ST elevation MI. Code STEMI activated and I discussed with Dr. Tamala Julian, of cardiology, who has reviewed and is in agreement. Patient has already taken an aspirin today. Will give her heparin bolus and transferred immediately to [facility] for intervention. Pain, history not c/w dissection or PE.   Duffy Bruce, MD 08/31/16 1057  Final Clinical Impressions(s) / ED Diagnoses   Final diagnoses:  ST elevation myocardial infarction (STEMI), unspecified artery ([place])      Duffy Bruce, MD 08/31/16 1104    Duffy Bruce, MD 08/31/16 1105

## 2016-08-31 NOTE — ED Provider Notes (Signed)
76 year old female with history of stroke, hypertension, hyperlipidemia here with concerning story of chest pain. Symptoms began at 7:30 AM. Pain is crushing, substernal, with nausea and diaphoresis. EKG reviewed and shows new ST flattening and possible elevation in leads 3 and 4 lateral precordial leads. She has reciprocal changes in the lateral leads. Concern for ST elevation MI. Code STEMI activated and I discussed with Dr. Tamala Julian, of cardiology, who has reviewed and is in agreement. Patient is artery taken an aspirin today. Will give her heparin bolus and transferred immediately to [facility] for intervention.   Duffy Bruce, MD 08/31/16 1057

## 2016-08-31 NOTE — ED Notes (Signed)
Family at bedside. 

## 2016-08-31 NOTE — Progress Notes (Signed)
ANTICOAGULATION CONSULT NOTE - Initial Consult  Pharmacy Consult for heparin  Indication: chest pain/ACS  Allergies  Allergen Reactions  . Nickel     Discovered allergy post knee surgery  . Codeine Rash    Patient Measurements: Height: 5\' 6"  (167.6 cm) Weight: 236 lb (107 kg) IBW/kg (Calculated) : 59.3 Heparin Dosing Weight: 84 kg  Vital Signs: Temp: 97.9 F (36.6 C) (05/22 1216) Temp Source: Oral (05/22 1216) BP: 140/105 (05/22 1211) Pulse Rate: 64 (05/22 1211)  Labs:  Recent Labs  08/31/16 1050  HGB 13.3  HCT 40.2  PLT 260  LABPROT 12.2  INR 0.91  CREATININE 0.89  TROPONINI 0.27*    Estimated Creatinine Clearance: 69.7 mL/min (by C-G formula based on SCr of 0.89 mg/dL).   Medical History: Past Medical History:  Diagnosis Date  . Allergy   . Arthritis    "all over" (07/10/2013)  . Carpal tunnel syndrome of left wrist   . Cerebral aneurysm, nonruptured    3-4 mm in size, stable since 2004 (most recent MRI 2014)  . Colon polyp 2007  . Diverticulitis   . Family history of anesthesia complication    "brother had PONV"  . Gastroesophageal reflux disease   . High cholesterol   . Hypertension   . Insomnia   . PONV (postoperative nausea and vomiting)   . Restless leg syndrome   . Stroke [facility]) 02/2013   denies residual on 07/10/2013)      Assessment: 76 yo female with chest pain, initially thought to be a STEMI then cardiology ruled out. Elevated troponin. Placing on heparin while evaluating patient. SCr 0.89, hgb 13.3, plts wnl. Pt had cath without obstruction ~2 years ago. Pt in inadvertently got heparin bolus 4000 units x2 today.    Goal of Therapy:  Heparin level 0.3-0.7 units/ml Monitor platelets by anticoagulation protocol: Yes    Plan:  -Heparin 4000 units x1 then 1100 units/hr -Daily HL, CBC -Level this evening    Harvel Quale 08/31/2016,12:34 PM

## 2016-08-31 NOTE — ED Notes (Signed)
ED Provider at bedside. 

## 2016-08-31 NOTE — ED Notes (Signed)
Pt arrived via [facility] from [facility]. Code STEMI cancelled per Dr. Gwenlyn Found. Pt rates chest pain 3 out of 10. VSS. Pt received 324mg  of aspirin at home, received nitrox3, and 200cc of NS. NSR with occasional PVC's. Pt a&ox4.

## 2016-08-31 NOTE — H&P (Signed)
H&P   Patient ID: Aimee Hood; [id number]; 23-Oct-1940   Admit date: 08/31/2016 Date of admission: 08/31/2016  Primary Care Provider: Kristopher Glee., MD Primary Cardiologist: Dr Marlou Porch  CC- Chest pain  History of Present Illness:   Aimee Hood 76 y.o. female with a hx of HTN, HL with high triglycerides, obesity, and DJD.  She was evaluated by Dr. Candee Furbish in 02/2013 for surgical clearance prior to L TKR.  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  TEE then was neg for source of embolus and an event monitor did not demonstrate AFib.  She eventually had her L TKR 07/2013.   She was seen again Feb 2016 with chest pain and underwent coronary angiogram 06/20/14. This demonstrated normal coronary arteries. DDimer then was elevated but a Chest CTA was neg for pulmonary embolism. LOV was April 2016.  She presented to [facility] this am with SSCP. She received a NTG SL in the ED and became hypotensive. Her EKG there was read as suggestive of inferior ST elevation. She was transferred to [facility] ED as a STEMI. She was evaluated in the ED by Dr Gwenlyn Found who did not feel she was a STEMI and that her symptoms were atypical for [place] but her Troponin is elevated-0.27.    Past Medical History:  Diagnosis Date  . Allergy   . Arthritis    "all over" (07/10/2013)  . Carpal tunnel syndrome of left wrist   . Cerebral aneurysm, nonruptured    3-4 mm in size, stable since 2004 (most recent MRI 2014)  . Colon polyp 2007  . Diverticulitis   . Family history of anesthesia complication    "brother had PONV"  . Gastroesophageal reflux disease   . High cholesterol   . Hypertension   . Insomnia   . PONV (postoperative nausea and vomiting)   . Restless leg syndrome   . Stroke [facility]) 02/2013   denies residual on 07/10/2013)    Past Surgical History:  Procedure Laterality Date  . BREAST SURGERY    . CHOLECYSTECTOMY    . JOINT REPLACEMENT    . KNEE ARTHROSCOPY Bilateral   .  LEFT HEART CATHETERIZATION WITH CORONARY ANGIOGRAM N/A 06/20/2014   Procedure: LEFT HEART CATHETERIZATION WITH CORONARY ANGIOGRAM;  Surgeon: [name], MD;  Location[facility] C[facility] LAB;  Service: Cardiovascular;  Laterality: N/A;  . TEE WITHOUT CARDIOVERSION N/A 03/14/2013   Procedure: TRANSESOPHAGEAL ECHOCARDIOGRAM (TEE);  Surgeon: Candee Furbish, MD;  Location: [facility] ENDOSCOPY;  Service: Cardiovascular;  Laterality: N/A;  . TOTAL KNEE ARTHROPLASTY Left 07/10/2013  . TOTAL KNEE ARTHROPLASTY Left 07/10/2013   Procedure: TOTAL KNEE ARTHROPLASTY;  Surgeon: Garald Balding, MD;  Location: [place];  Service: Orthopedics;  Laterality: Left;  Marland Kitchen VAGINAL HYSTERECTOMY  2000's     Inpatient Medications: Scheduled Meds: . heparin  4,000 Units Intravenous Once   Continuous Infusions: . sodium chloride    . heparin     PRN Meds: nitroGLYCERIN  Allergies:    Allergies  Allergen Reactions  . Nickel     Discovered allergy post knee surgery  . Codeine Rash    Social History:   Social History   Social History  . Marital status: Married    Spouse name: N/A  . Number of children: N/A  . Years of education: N/A   Occupational History  . hairdresser [employer]   Social History Main Topics  . Smoking status: Never Smoker  .  Smokeless tobacco: Never Used  . Alcohol use No  . Drug use: No  . Sexual activity: Not Currently   Other Topics Concern  . Not on file   Social History Narrative  . No narrative on file    Family History:   The patient's family history includes Cancer in her brother, father, maternal grandmother, and sister; Diabetes in her paternal aunt; Heart attack in her brother; Heart disease (age of onset: 57) in her brother; Hypertension in her father. There is no history of Stroke.  ROS:  Please see the history of present illness.  All other ROS reviewed and negative.     Physical Exam/Data:   Vitals:   08/31/16 1211 08/31/16 1215 08/31/16 1216 08/31/16 1230  BP:  (!) 140/105 (!) 150/74  (!) 145/78  Pulse: 64 (!) 58  (!) 57  Resp: 18 16  17   Temp:   97.9 F (36.6 C)   TempSrc:   Oral   SpO2: 97% 100%  98%  Weight:      Height:       No intake or output data in the 24 hours ending 08/31/16 1247 Filed Weights   08/31/16 1033  Weight: 236 lb (107 kg)   Body mass index is 38.09 kg/m.  General:  Well nourished, well developed, in no acute distress HEENT: normal Lymph: no adenopathy Neck: no JVD Endocrine:  No thryomegaly Vascular: No carotid bruits; FA pulses 2+ bilaterally without bruits  Cardiac:  normal S1, S2; RRR; no murmur  Lungs:  clear to auscultation bilaterally, no wheezing, rhonchi or rales  Abd: soft, nontender, no hepatomegaly  Ext: no edema Musculoskeletal:  No deformities, BUE and BLE strength normal and equal Skin: warm and dry  Neuro:  CNs 2-12 intact, no focal abnormalities noted Psych:  Normal affect    EKG:  The EKG was personally reviewed and demonstrates NSR, PVCs  Relevant CV Studies:   Laboratory Data:  Chemistry Recent Labs Lab 08/31/16 1050  NA 139  K 4.0  CL 106  CO2 26  GLUCOSE 105*  BUN 17  CREATININE 0.89  CALCIUM 9.5  GFRNONAA >60  GFRAA >60  ANIONGAP 7     Recent Labs Lab 08/31/16 1050  PROT 7.0  ALBUMIN 4.2  AST 22  ALT 14  ALKPHOS 63  BILITOT 0.6   Hematology Recent Labs Lab 08/31/16 1050  WBC 8.9  RBC 4.46  HGB 13.3  HCT 40.2  MCV 90.1  MCH 29.8  MCHC 33.1  RDW 12.9  PLT 260   Cardiac Enzymes Recent Labs Lab 08/31/16 1050  TROPONINI 0.27*   No results for input(s): TROPIPOC in the last 168 hours.  BNPNo results for input(s): BNP, PROBNP in the last 168 hours.  DDimer No results for input(s): DDIMER in the last 168 hours.  Radiology/Studies:  Dg Chest Port 1 View  Result Date: 08/31/2016 CLINICAL DATA:  LEFT side chest pain and heaviness, slight shortness of breath, code STEMI, hypertension, hyperlipidemia EXAM: PORTABLE CHEST 1 VIEW COMPARISON:  Portable  exam 1050 hours compared to 07/02/2013 FINDINGS: Minimal enlargement of cardiac silhouette. Mild tortuosity of thoracic aorta. Mediastinal contours and pulmonary vascularity normal. Lungs grossly clear. No pleural effusion or pneumothorax. Bones demineralized. IMPRESSION: Minimal enlargement of cardiac silhouette. No acute abnormalities. Electronically Signed   By: Lavonia Dana M.D.   On: 08/31/2016 10:58    Assessment and Plan:   Chest pain with a moderate risk for ACS Symptoms felt to be atypical but troponin  is elevated  Normal Coronaries  Cath March 2016  H/O CVA 2014- TPA  HTN On ACE and beta blocker pta  Plan: Discussed with Dr Gwenlyn Found- admit, Heparin, nitrate, check echo, check D-dimer.   Angelena Form, PA-C  08/31/2016 12:47 PM  Agree with note written by Kerin Ransom [facility]   Clean cors 2 years ago. H/O HTN. CP this AM after taking a shower. EMS called ---> [facility]. EKG showed mils lat ST depression initially. No evid of ST elevation. Hen I saw her she was pain free. Exam was benign. Initial trop .27. Will admit, place on IV hep. Cycle enz,  2D echo. Possibly cor vasospasm. Doubt SCAD in a 76 year old.    Quay Burow 08/31/2016 1:11 PM

## 2016-08-31 NOTE — ED Notes (Signed)
Report given to RN

## 2016-08-31 NOTE — Progress Notes (Signed)
[place] [place] for heparin  Indication: chest pain/ACS  Allergies  Allergen Reactions  . Nickel     Discovered allergy post knee surgery  . Codeine Rash    Patient Measurements: Height: 5\' 6"  (167.6 cm) Weight: 236 lb 9.6 oz (107.3 kg) IBW/kg (Calculated) : 59.3 Heparin Dosing Weight: 84 kg  Vital Signs: Temp: 98.3 F (36.8 C) (05/22 1942) Temp Source: Oral (05/22 1942) BP: 129/90 (05/22 1942) Pulse Rate: 78 (05/22 1942)  Labs:  Recent Labs  08/31/16 1050 08/31/16 1413 08/31/16 1852  HGB 13.3  --   --   HCT 40.2  --   --   PLT 260  --   --   LABPROT 12.2  --   --   INR 0.91  --   --   HEPARINUNFRC  --   --  0.35  CREATININE 0.89  --   --   TROPONINI 0.27* 0.67* 3.11*    Estimated Creatinine Clearance: 69.8 mL/min (by C-G formula based on SCr of 0.89 mg/dL).   Medical History: Past Medical History:  Diagnosis Date  . Allergy   . Arthritis    "all over" (07/10/2013)  . Carpal tunnel syndrome of left wrist   . Cerebral aneurysm, nonruptured    3-4 mm in size, stable since 2004 (most recent MRI 2014)  . Colon polyp 2007  . Diverticulitis   . Family history of anesthesia complication    "brother had PONV"  . Gastroesophageal reflux disease   . High cholesterol   . Hypertension   . Insomnia   . Macular degeneration 08/31/2016  . PONV (postoperative nausea and vomiting)   . Restless leg syndrome   . Stroke [facility]) 02/2013   denies residual on 07/10/2013)      Assessment: 76 yo female with chest pain, initially thought to be a STEMI then cardiology ruled out. Elevated troponin. Placing on heparin while evaluating patient. SCr 0.89, hgb 13.3, plts wnl. Pt had cath without obstruction ~2 years ago. Pt in inadvertently got heparin bolus 4000 units x2 today.   Initial heparin level at goal (0.35).  No overt bleeding or complications noted.   Goal of Therapy:  Heparin level 0.3-0.7 units/ml Monitor platelets by  anticoagulation protocol: Yes    Plan:  -Continue IV heparin at current rate -Confirm heparin level in 8 hrs -Daily heparin level, CBC -F/u plans for cath lab.  Uvaldo Rising, BCPS  Clinical Pharmacist Pager [phone]  08/31/2016 8:14 PM

## 2016-08-31 NOTE — ED Triage Notes (Signed)
L side chest pain started at 7:30 this morning while drying off after a shower, radiates to L arm, endorses nausea and SOB. Describes as pressure.

## 2016-09-01 ENCOUNTER — Other Ambulatory Visit (HOSPITAL_COMMUNITY): Payer: PPO

## 2016-09-01 ENCOUNTER — Encounter (HOSPITAL_COMMUNITY): Payer: Self-pay | Admitting: Cardiovascular Disease

## 2016-09-01 ENCOUNTER — Encounter (HOSPITAL_COMMUNITY)
Admission: EM | Disposition: A | Discharge status: Home / Self Care | Payer: Self-pay | Source: Home / Self Care | Attending: Cardiovascular Disease

## 2016-09-01 DIAGNOSIS — Z96652 Presence of left artificial knee joint: Secondary | ICD-10-CM | POA: Diagnosis not present

## 2016-09-01 DIAGNOSIS — Z6838 Body mass index (BMI) 38.0-38.9, adult: Secondary | ICD-10-CM | POA: Diagnosis not present

## 2016-09-01 DIAGNOSIS — R0602 Shortness of breath: Secondary | ICD-10-CM | POA: Diagnosis not present

## 2016-09-01 DIAGNOSIS — I5181 Takotsubo syndrome: Secondary | ICD-10-CM | POA: Diagnosis not present

## 2016-09-01 DIAGNOSIS — E7800 Pure hypercholesterolemia, unspecified: Secondary | ICD-10-CM | POA: Diagnosis not present

## 2016-09-01 DIAGNOSIS — I214 Non-ST elevation (NSTEMI) myocardial infarction: Principal | ICD-10-CM

## 2016-09-01 DIAGNOSIS — E781 Pure hyperglyceridemia: Secondary | ICD-10-CM | POA: Diagnosis not present

## 2016-09-01 DIAGNOSIS — Z7982 Long term (current) use of aspirin: Secondary | ICD-10-CM | POA: Diagnosis not present

## 2016-09-01 DIAGNOSIS — I2510 Atherosclerotic heart disease of native coronary artery without angina pectoris: Secondary | ICD-10-CM | POA: Diagnosis not present

## 2016-09-01 DIAGNOSIS — Q211 Atrial septal defect: Secondary | ICD-10-CM | POA: Diagnosis not present

## 2016-09-01 DIAGNOSIS — Z8673 Personal history of transient ischemic attack (TIA), and cerebral infarction without residual deficits: Secondary | ICD-10-CM

## 2016-09-01 DIAGNOSIS — R079 Chest pain, unspecified: Secondary | ICD-10-CM | POA: Diagnosis not present

## 2016-09-01 DIAGNOSIS — I119 Hypertensive heart disease without heart failure: Secondary | ICD-10-CM | POA: Diagnosis not present

## 2016-09-01 DIAGNOSIS — E669 Obesity, unspecified: Secondary | ICD-10-CM | POA: Diagnosis not present

## 2016-09-01 HISTORY — PX: LEFT HEART CATH AND CORONARY ANGIOGRAPHY: CATH118249

## 2016-09-01 LAB — TROPONIN I: Troponin I: 4.6 ng/mL (ref <0.03)

## 2016-09-01 LAB — CBC
HCT: 38.2 % (ref 36.0–46.0)
HCT: 38.7 % (ref 36.0–46.0)
HEMOGLOBIN: 12 g/dL (ref 12.0–15.0)
Hemoglobin: 13 g/dL (ref 12.0–15.0)
MCH: 28.7 pg (ref 26.0–34.0)
MCH: 30.2 pg (ref 26.0–34.0)
MCHC: 31.4 g/dL (ref 30.0–36.0)
MCHC: 33.6 g/dL (ref 30.0–36.0)
MCV: 91.4 fL (ref 78.0–100.0)
MCV: 90 fL (ref 78.0–100.0)
PLATELETS: 236 10*3/uL (ref 150–400)
Platelets: 232 10*3/uL (ref 150–400)
RBC: 4.18 MIL/uL (ref 3.87–5.11)
RBC: 4.3 MIL/uL (ref 3.87–5.11)
RDW: 13.2 % (ref 11.5–15.5)
RDW: 13.3 % (ref 11.5–15.5)
WBC: 7.2 10*3/uL (ref 4.0–10.5)
WBC: 8.2 10*3/uL (ref 4.0–10.5)

## 2016-09-01 LAB — HEPARIN LEVEL (UNFRACTIONATED): Heparin Unfractionated: 0.2 IU/mL — ABNORMAL LOW (ref 0.30–0.70)

## 2016-09-01 LAB — CREATININE, SERUM
CREATININE: 0.88 mg/dL (ref 0.44–1.00)
GFR calc Af Amer: >60 mL/min (ref >60)

## 2016-09-01 SURGERY — LEFT HEART CATH AND CORONARY ANGIOGRAPHY
Anesthesia: LOCAL

## 2016-09-01 MED ORDER — SODIUM CHLORIDE 0.9% FLUSH: 3.0000 mL | INTRAVENOUS | Status: DC | PRN

## 2016-09-01 MED ORDER — SODIUM CHLORIDE 0.9 % IV SOLN: 250.0000 mL | INTRAVENOUS | Status: DC | PRN

## 2016-09-01 MED ORDER — SODIUM CHLORIDE 0.9 % WEIGHT BASED INFUSION
3.0000 mL/kg/h | INTRAVENOUS | Status: DC
  Administered 2016-09-01: 3 mL/kg/h via INTRAVENOUS

## 2016-09-01 MED ORDER — MIDAZOLAM HCL 2 MG/2ML IJ SOLN
INTRAMUSCULAR | Status: AC
  Filled 2016-09-01: qty 2

## 2016-09-01 MED ORDER — LORAZEPAM 0.5 MG PO TABS
0.5000 mg | ORAL_TABLET | Freq: Every day | ORAL | Status: DC
  Filled 2016-09-01: qty 1

## 2016-09-01 MED ORDER — HEPARIN (PORCINE) IN NACL 2-0.9 UNIT/ML-% IJ SOLN
INTRAMUSCULAR | Status: AC | PRN
  Administered 2016-09-01: 1000 mL

## 2016-09-01 MED ORDER — ASPIRIN 81 MG PO CHEW
81.0000 mg | CHEWABLE_TABLET | ORAL | Status: AC
  Administered 2016-09-01: 81 mg via ORAL
  Filled 2016-09-01: qty 1

## 2016-09-01 MED ORDER — IOPAMIDOL (ISOVUE-370) INJECTION 76%
INTRAVENOUS | Status: DC | PRN
  Administered 2016-09-01: 100 mL via INTRA_ARTERIAL

## 2016-09-01 MED ORDER — LIDOCAINE HCL 1 % IJ SOLN
INTRAMUSCULAR | Status: AC
  Filled 2016-09-01: qty 20

## 2016-09-01 MED ORDER — HEPARIN (PORCINE) IN NACL 2-0.9 UNIT/ML-% IJ SOLN
INTRAMUSCULAR | Status: AC
  Filled 2016-09-01: qty 1000

## 2016-09-01 MED ORDER — IOPAMIDOL (ISOVUE-370) INJECTION 76%
INTRAVENOUS | Status: AC
  Filled 2016-09-01: qty 100

## 2016-09-01 MED ORDER — VERAPAMIL HCL 2.5 MG/ML IV SOLN
INTRAVENOUS | Status: AC
  Filled 2016-09-01: qty 2

## 2016-09-01 MED ORDER — FENTANYL CITRATE (PF) 100 MCG/2ML IJ SOLN
INTRAMUSCULAR | Status: DC | PRN
  Administered 2016-09-01: 50 ug via INTRAVENOUS

## 2016-09-01 MED ORDER — HEPARIN (PORCINE) IN NACL 2-0.9 UNIT/ML-% IJ SOLN
INTRAMUSCULAR | Status: DC | PRN
  Administered 2016-09-01: 10 mL via INTRA_ARTERIAL

## 2016-09-01 MED ORDER — SODIUM CHLORIDE 0.9 % IV SOLN
INTRAVENOUS | Status: AC
  Administered 2016-09-01: 100 mL/h via INTRAVENOUS

## 2016-09-01 MED ORDER — SODIUM CHLORIDE 0.9% FLUSH
3.0000 mL | Freq: Two times a day (BID) | INTRAVENOUS | Status: DC
  Administered 2016-09-01: 3 mL via INTRAVENOUS

## 2016-09-01 MED ORDER — SODIUM CHLORIDE 0.9% FLUSH
3.0000 mL | Freq: Two times a day (BID) | INTRAVENOUS | Status: DC
  Administered 2016-09-01 – 2016-09-02 (×2): 3 mL via INTRAVENOUS

## 2016-09-01 MED ORDER — HEPARIN SODIUM (PORCINE) 1000 UNIT/ML IJ SOLN
INTRAMUSCULAR | Status: AC
  Filled 2016-09-01: qty 1

## 2016-09-01 MED ORDER — FENTANYL CITRATE (PF) 100 MCG/2ML IJ SOLN
INTRAMUSCULAR | Status: AC
  Filled 2016-09-01: qty 2

## 2016-09-01 MED ORDER — ENOXAPARIN SODIUM 40 MG/0.4ML ~~LOC~~ SOLN
40.0000 mg | SUBCUTANEOUS | Status: DC
  Administered 2016-09-02: 40 mg via SUBCUTANEOUS
  Filled 2016-09-01: qty 0.4

## 2016-09-01 MED ORDER — METOPROLOL SUCCINATE ER 50 MG PO TB24
50.0000 mg | ORAL_TABLET | Freq: Every day | ORAL | Status: DC
  Administered 2016-09-02: 50 mg via ORAL
  Filled 2016-09-01: qty 1

## 2016-09-01 MED ORDER — HEPARIN SODIUM (PORCINE) 1000 UNIT/ML IJ SOLN
INTRAMUSCULAR | Status: DC | PRN
  Administered 2016-09-01: 5000 [IU] via INTRAVENOUS

## 2016-09-01 MED ORDER — ATORVASTATIN CALCIUM 40 MG PO TABS
40.0000 mg | ORAL_TABLET | Freq: Every day | ORAL | Status: DC
  Administered 2016-09-01: 40 mg via ORAL
  Filled 2016-09-01: qty 1

## 2016-09-01 MED ORDER — MIDAZOLAM HCL 2 MG/2ML IJ SOLN
INTRAMUSCULAR | Status: DC | PRN
  Administered 2016-09-01: 1 mg via INTRAVENOUS

## 2016-09-01 MED ORDER — SODIUM CHLORIDE 0.9 % WEIGHT BASED INFUSION
1.0000 mL/kg/h | INTRAVENOUS | Status: DC
  Administered 2016-09-01: 1 mL/kg/h via INTRAVENOUS

## 2016-09-01 MED ORDER — LIDOCAINE HCL (PF) 1 % IJ SOLN
INTRAMUSCULAR | Status: DC | PRN
  Administered 2016-09-01: 2 mL via INTRADERMAL

## 2016-09-01 SURGICAL SUPPLY — 12 items
CATH INFINITI 5FR ANG PIGTAIL (CATHETERS) ×2 IMPLANT
CATH OPTITORQUE JACKY 4.0 5F (CATHETERS) ×2 IMPLANT
DEVICE RAD COMP TR BAND LRG (VASCULAR PRODUCTS) ×2 IMPLANT
GLIDESHEATH SLEND SS 6F .021 (SHEATH) ×2 IMPLANT
GUIDEWIRE INQWIRE 1.5J.035X260 (WIRE) ×1 IMPLANT
INQWIRE 1.5J .035X260CM (WIRE) ×2
KIT HEART LEFT (KITS) ×2 IMPLANT
PACK CARDIAC CATHETERIZATION (CUSTOM PROCEDURE TRAY) ×2 IMPLANT
SYR MEDRAD MARK V 150ML (SYRINGE) ×2 IMPLANT
TRANSDUCER W/STOPCOCK (MISCELLANEOUS) ×2 IMPLANT
TUBING CIL FLEX 10 FLL-RA (TUBING) ×2 IMPLANT
WIRE HI TORQ VERSACORE-J 145CM (WIRE) ×2 IMPLANT

## 2016-09-01 NOTE — Plan of Care (Signed)
Problem: Pain Managment: Goal: General experience of comfort will improve Outcome: Progressing Pt is currently CP free. Will continue to monitor.

## 2016-09-01 NOTE — Progress Notes (Signed)
Progress Note  Patient Name: Aimee Hood Date of Encounter: 09/01/2016  Primary Cardiologist: Marlou Porch  Subjective   Prior CP, no SOB  Inpatient Medications    Scheduled Meds: . aspirin  324 mg Oral NOW   Or  . aspirin  300 mg Rectal NOW  . aspirin EC  81 mg Oral Daily  . fenofibrate  54 mg Oral Daily  . metoprolol succinate  25 mg Oral Daily  . nitroGLYCERIN  0.5 inch Topical Q6H  . pantoprazole  40 mg Oral Daily  . ramipril  5 mg Oral Daily  . sodium chloride flush  3 mL Intravenous Q12H   Continuous Infusions: . sodium chloride 10 mL/hr (08/31/16 1250)  . sodium chloride    . sodium chloride    . sodium chloride 1 mL/kg/hr (09/01/16 0626)  . heparin 1,300 Units/hr (09/01/16 0515)   PRN Meds: sodium chloride, sodium chloride, acetaminophen, clonazePAM, nitroGLYCERIN, ondansetron (ZOFRAN) IV, sodium chloride flush   Vital Signs    Vitals:   08/31/16 1942 08/31/16 2300 09/01/16 0500 09/01/16 0802  BP: 129/90 (!) 154/88 125/88 (!) 151/93  Pulse: 78 70 86 73  Resp: 16 (!) 21 20 19   Temp: 98.3 F (36.8 C)  97.7 F (36.5 C) 97.6 F (36.4 C)  TempSrc: Oral  Oral Oral  SpO2: 97% 95% 98% 98%  Weight:   236 lb 6.4 oz (107.2 kg)   Height:        Intake/Output Summary (Last 24 hours) at 09/01/16 0837 Last data filed at 09/01/16 0803  Gross per 24 hour  Intake           995.86 ml  Output             1400 ml  Net          -404.14 ml   Filed Weights   08/31/16 1033 08/31/16 1541 09/01/16 0500  Weight: 236 lb (107 kg) 236 lb 9.6 oz (107.3 kg) 236 lb 6.4 oz (107.2 kg)    Telemetry    No adverse rhythms - Personally Reviewed  ECG    ST depression laterally - Personally Reviewed  Physical Exam   GEN: No acute distress.   Neck: No JVD Cardiac: RRR, no murmurs, rubs, or gallops.  Respiratory: Clear to auscultation bilaterally. GI: Soft, nontender, non-distended  MS: No edema; No deformity. Neuro:  Nonfocal  Psych: Normal affect   Labs     Chemistry Recent Labs Lab 08/31/16 1050  NA 139  K 4.0  CL 106  CO2 26  GLUCOSE 105*  BUN 17  CREATININE 0.89  CALCIUM 9.5  PROT 7.0  ALBUMIN 4.2  AST 22  ALT 14  ALKPHOS 63  BILITOT 0.6  GFRNONAA >60  GFRAA >60  ANIONGAP 7     Hematology Recent Labs Lab 08/31/16 1050 09/01/16 0329  WBC 8.9 7.2  RBC 4.46 4.18  HGB 13.3 12.0  HCT 40.2 38.2  MCV 90.1 91.4  MCH 29.8 28.7  MCHC 33.1 31.4  RDW 12.9 13.2  PLT 260 232    Cardiac Enzymes Recent Labs Lab 08/31/16 1050 08/31/16 1413 08/31/16 1852 09/01/16 0037  TROPONINI 0.27* 0.67* 3.11* 4.60*    Recent Labs Lab 08/31/16 1235  TROPIPOC 0.50*     BNPNo results for input(s): BNP, PROBNP in the last 168 hours.   DDimer  Recent Labs Lab 08/31/16 1852  DDIMER 0.47     Radiology    Dg Chest [facility] 1 View  Result Date:  08/31/2016 CLINICAL DATA:  LEFT side chest pain and heaviness, slight shortness of breath, code STEMI, hypertension, hyperlipidemia EXAM: PORTABLE CHEST 1 VIEW COMPARISON:  Portable exam 1050 hours compared to 07/02/2013 FINDINGS: Minimal enlargement of cardiac silhouette. Mild tortuosity of thoracic aorta. Mediastinal contours and pulmonary vascularity normal. Lungs grossly clear. No pleural effusion or pneumothorax. Bones demineralized. IMPRESSION: Minimal enlargement of cardiac silhouette. No acute abnormalities. Electronically Signed   By: Lavonia Dana M.D.   On: 08/31/2016 10:58    Cardiac Studies   Cardiac cath 06/20/14 LM: No angiographically significant CAD. LAD: No angiographically significant CAD.  LCx: No angiographically significant CAD.  Right coronary artery (RCA): No angiographically significant CAD.  EF:  60%. LVEDP 19 mmHg  Chest CTA 06/12/14 IMPRESSION: No evidence of pulmonary emboli. Changes of prior granulomatous disease. No other focal abnormality is seen.  Event Monitor 05/30/13 No AFib  TEE 03/14/13 - EF 60-65%. - Mild MR - No LA or LAA clot - No RA  clot. - Atrial septum: small patent foramen ovale. Redundant intraatrial septum.  Carotid US 03/10/13 Bilateral: 1-39% ICA stenosis  Echocardiogram 03/09/13 - EF 55-60%. Wall motion was normal - Mitral valve: Mild regurgitation. - Left atrium: The atrium was mildly dilated.   Patient Profile     76 y.o. female with NSTEMI  Assessment & Plan    NSTEMI  - cath. Risks and benefits discussed  - IV hep, asa, bb  - Will add statin  - prior cath normal cors 2016  Prior CVA  - TPA 11/14  Hyper trigs  - fibrates  Essential hypertension  - stable. Meds rev.   Signed, Candee Furbish, MD  09/01/2016, 8:37 AM

## 2016-09-01 NOTE — Care Management Obs Status (Signed)
[facility] NOTIFICATION   Patient Details  Name: Aimee Hood MRN: [id number] Date of Birth: June 25, 1940   Medicare Observation Status Notification Given:  Yes    Carles Collet, RN 09/01/2016, 2:56 PM

## 2016-09-01 NOTE — Plan of Care (Signed)
Problem: Safety: Goal: Ability to remain free from injury will improve Outcome: Completed/Met Date Met: 09/01/16 Patient uses the call bell for assistance to the restroom, she is a stand by assist with staff.

## 2016-09-01 NOTE — Progress Notes (Signed)
[facility] for heparin  Indication: chest pain/ACS  Allergies  Allergen Reactions  . Nickel     Discovered allergy post knee surgery  . Codeine Rash    Patient Measurements: Height: 5\' 6"  (167.6 cm) Weight: 236 lb 9.6 oz (107.3 kg) IBW/kg (Calculated) : 59.3 Heparin Dosing Weight: 84 kg  Vital Signs: Temp: 98.3 F (36.8 C) (05/22 1942) Temp Source: Oral (05/22 1942) BP: 154/88 (05/22 2300) Pulse Rate: 70 (05/22 2300)  Labs:  Recent Labs  08/31/16 1050 08/31/16 1413 08/31/16 1852 09/01/16 0037 09/01/16 0329  HGB 13.3  --   --   --  12.0  HCT 40.2  --   --   --  38.2  PLT 260  --   --   --  232  LABPROT 12.2  --   --   --   --   INR 0.91  --   --   --   --   HEPARINUNFRC  --   --  0.35  --  0.20*  CREATININE 0.89  --   --   --   --   TROPONINI 0.27* 0.67* 3.11* 4.60*  --     Estimated Creatinine Clearance: 69.8 mL/min (by C-G formula based on SCr of 0.89 mg/dL).   Medical History: Past Medical History:  Diagnosis Date  . Allergy   . Arthritis    "all over" (07/10/2013)  . Carpal tunnel syndrome of left wrist   . Cerebral aneurysm, nonruptured    3-4 mm in size, stable since 2004 (most recent MRI 2014)  . Colon polyp 2007  . Diverticulitis   . Family history of anesthesia complication    "brother had PONV"  . Gastroesophageal reflux disease   . High cholesterol   . Hypertension   . Insomnia   . Macular degeneration 08/31/2016  . PONV (postoperative nausea and vomiting)   . Restless leg syndrome   . Stroke [facility]) 02/2013   denies residual on 07/10/2013)      Assessment: 76 yo female with chest pain, initially thought to be a STEMI then cardiology ruled out. Elevated troponin. Placing on heparin while evaluating patient. SCr 0.89, hgb 13.3, plts wnl. Pt had cath without obstruction ~2 years ago. Pt in inadvertently got heparin bolus 4000 units x2 today.   Heparin level subtherapeutic at 0.20. No issues with  infusion. CBC stable.    Goal of Therapy:  Heparin level 0.3-0.7 units/ml Monitor platelets by anticoagulation protocol: Yes    Plan:  -Increase heparin infusion to 1300 units/hr  -Recheck heparin level in 8 hrs -Daily heparin level, CBC -F/u plans for cath lab  Vincenza Hews, PharmD, BCPS 09/01/2016, 5:07 AM

## 2016-09-01 NOTE — H&P (View-Only) (Signed)
Progress Note  Patient Name: Aimee Hood Date of Encounter: 09/01/2016  Primary Cardiologist: Marlou Porch  Subjective   Prior CP, no SOB  Inpatient Medications    Scheduled Meds: . aspirin  324 mg Oral NOW   Or  . aspirin  300 mg Rectal NOW  . aspirin EC  81 mg Oral Daily  . fenofibrate  54 mg Oral Daily  . metoprolol succinate  25 mg Oral Daily  . nitroGLYCERIN  0.5 inch Topical Q6H  . pantoprazole  40 mg Oral Daily  . ramipril  5 mg Oral Daily  . sodium chloride flush  3 mL Intravenous Q12H   Continuous Infusions: . sodium chloride 10 mL/hr (08/31/16 1250)  . sodium chloride    . sodium chloride    . sodium chloride 1 mL/kg/hr (09/01/16 0626)  . heparin 1,300 Units/hr (09/01/16 0515)   PRN Meds: sodium chloride, sodium chloride, acetaminophen, clonazePAM, nitroGLYCERIN, ondansetron (ZOFRAN) IV, sodium chloride flush   Vital Signs    Vitals:   08/31/16 1942 08/31/16 2300 09/01/16 0500 09/01/16 0802  BP: 129/90 (!) 154/88 125/88 (!) 151/93  Pulse: 78 70 86 73  Resp: 16 (!) 21 20 19   Temp: 98.3 F (36.8 C)  97.7 F (36.5 C) 97.6 F (36.4 C)  TempSrc: Oral  Oral Oral  SpO2: 97% 95% 98% 98%  Weight:   236 lb 6.4 oz (107.2 kg)   Height:        Intake/Output Summary (Last 24 hours) at 09/01/16 0837 Last data filed at 09/01/16 0803  Gross per 24 hour  Intake           995.86 ml  Output             1400 ml  Net          -404.14 ml   Filed Weights   08/31/16 1033 08/31/16 1541 09/01/16 0500  Weight: 236 lb (107 kg) 236 lb 9.6 oz (107.3 kg) 236 lb 6.4 oz (107.2 kg)    Telemetry    No adverse rhythms - Personally Reviewed  ECG    ST depression laterally - Personally Reviewed  Physical Exam   GEN: No acute distress.   Neck: No JVD Cardiac: RRR, no murmurs, rubs, or gallops.  Respiratory: Clear to auscultation bilaterally. GI: Soft, nontender, non-distended  MS: No edema; No deformity. Neuro:  Nonfocal  Psych: Normal affect   Labs     Chemistry Recent Labs Lab 08/31/16 1050  NA 139  K 4.0  CL 106  CO2 26  GLUCOSE 105*  BUN 17  CREATININE 0.89  CALCIUM 9.5  PROT 7.0  ALBUMIN 4.2  AST 22  ALT 14  ALKPHOS 63  BILITOT 0.6  GFRNONAA >60  GFRAA >60  ANIONGAP 7     Hematology Recent Labs Lab 08/31/16 1050 09/01/16 0329  WBC 8.9 7.2  RBC 4.46 4.18  HGB 13.3 12.0  HCT 40.2 38.2  MCV 90.1 91.4  MCH 29.8 28.7  MCHC 33.1 31.4  RDW 12.9 13.2  PLT 260 232    Cardiac Enzymes Recent Labs Lab 08/31/16 1050 08/31/16 1413 08/31/16 1852 09/01/16 0037  TROPONINI 0.27* 0.67* 3.11* 4.60*    Recent Labs Lab 08/31/16 1235  TROPIPOC 0.50*     BNPNo results for input(s): BNP, PROBNP in the last 168 hours.   DDimer  Recent Labs Lab 08/31/16 1852  DDIMER 0.47     Radiology    Dg Chest [facility] 1 View  Result Date:  08/31/2016 CLINICAL DATA:  LEFT side chest pain and heaviness, slight shortness of breath, code STEMI, hypertension, hyperlipidemia EXAM: PORTABLE CHEST 1 VIEW COMPARISON:  Portable exam 1050 hours compared to 07/02/2013 FINDINGS: Minimal enlargement of cardiac silhouette. Mild tortuosity of thoracic aorta. Mediastinal contours and pulmonary vascularity normal. Lungs grossly clear. No pleural effusion or pneumothorax. Bones demineralized. IMPRESSION: Minimal enlargement of cardiac silhouette. No acute abnormalities. Electronically Signed   By: Lavonia Dana M.D.   On: 08/31/2016 10:58    Cardiac Studies   Cardiac cath 06/20/14 LM: No angiographically significant CAD. LAD: No angiographically significant CAD.  LCx: No angiographically significant CAD.  Right coronary artery (RCA): No angiographically significant CAD.  EF:  60%. LVEDP 19 mmHg  Chest CTA 06/12/14 IMPRESSION: No evidence of pulmonary emboli. Changes of prior granulomatous disease. No other focal abnormality is seen.  Event Monitor 05/30/13 No AFib  TEE 03/14/13 - EF 60-65%. - Mild MR - No LA or LAA clot - No RA  clot. - Atrial septum: small patent foramen ovale. Redundant intraatrial septum.  Carotid US 03/10/13 Bilateral: 1-39% ICA stenosis  Echocardiogram 03/09/13 - EF 55-60%. Wall motion was normal - Mitral valve: Mild regurgitation. - Left atrium: The atrium was mildly dilated.   Patient Profile     76 y.o. female with NSTEMI  Assessment & Plan    NSTEMI  - cath. Risks and benefits discussed  - IV hep, asa, bb  - Will add statin  - prior cath normal cors 2016  Prior CVA  - TPA 11/14  Hyper trigs  - fibrates  Essential hypertension  - stable. Meds rev.   Signed, Candee Furbish, MD  09/01/2016, 8:37 AM

## 2016-09-01 NOTE — Interval H&P Note (Signed)
Cath Lab Visit (complete for each Cath Lab visit)  Clinical Evaluation Leading to the Procedure:   ACS: Yes.    Non-ACS:  n/a     History and Physical Interval Note:  09/01/2016 9:41 AM  Aimee Hood  has presented today for surgery, with the diagnosis of chest pain  The various methods of treatment have been discussed with the patient and family. After consideration of risks, benefits and other options for treatment, the patient has consented to  Procedure(s): Left Heart Cath and Coronary Angiography (N/A) as a surgical intervention .  The patient's history has been reviewed, patient examined, no change in status, stable for surgery.  I have reviewed the patient's chart and labs.  Questions were answered to the patient's satisfaction.     [name]

## 2016-09-02 ENCOUNTER — Other Ambulatory Visit (HOSPITAL_COMMUNITY): Payer: PPO

## 2016-09-02 ENCOUNTER — Encounter (HOSPITAL_COMMUNITY): Payer: Self-pay | Admitting: Student

## 2016-09-02 DIAGNOSIS — I5181 Takotsubo syndrome: Secondary | ICD-10-CM

## 2016-09-02 LAB — CBC
HCT: 38.2 % (ref 36.0–46.0)
HEMOGLOBIN: 12.1 g/dL (ref 12.0–15.0)
MCH: 28.8 pg (ref 26.0–34.0)
MCHC: 31.7 g/dL (ref 30.0–36.0)
MCV: 91 fL (ref 78.0–100.0)
Platelets: 236 10*3/uL (ref 150–400)
RBC: 4.2 MIL/uL (ref 3.87–5.11)
RDW: 13.2 % (ref 11.5–15.5)
WBC: 7.4 10*3/uL (ref 4.0–10.5)

## 2016-09-02 MED ORDER — METOPROLOL SUCCINATE ER 50 MG PO TB24: 50.0000 mg | ORAL_TABLET | Freq: Every day | ORAL | 5 refills | Status: DC

## 2016-09-02 MED ORDER — RAMIPRIL 10 MG PO CAPS: 10.0000 mg | ORAL_CAPSULE | Freq: Every day | ORAL | 5 refills | Status: DC

## 2016-09-02 NOTE — Plan of Care (Signed)
Problem: Activity: Goal: Risk for activity intolerance will decrease Outcome: Completed/Met Date Met: 09/02/16 Patient ambulates both in the room and hallway with no assistance, tolerates well.

## 2016-09-02 NOTE — Progress Notes (Addendum)
Progress Note  Patient Name: Aimee Hood Date of Encounter: 09/02/2016  Primary Cardiologist: Marlou Porch  Subjective   Prior CP, no SOB. Currently without symptoms. Cath with no significant CAD.    Inpatient Medications    Scheduled Meds: . aspirin EC  81 mg Oral Daily  . atorvastatin  40 mg Oral q1800  . enoxaparin (LOVENOX) injection  40 mg Subcutaneous Q24H  . fenofibrate  54 mg Oral Daily  . LORazepam  0.5 mg Oral QHS  . metoprolol succinate  50 mg Oral Daily  . nitroGLYCERIN  0.5 inch Topical Q6H  . pantoprazole  40 mg Oral Daily  . ramipril  5 mg Oral Daily  . sodium chloride flush  3 mL Intravenous Q12H   Continuous Infusions: . sodium chloride 10 mL/hr (08/31/16 1250)  . sodium chloride    . sodium chloride     PRN Meds: sodium chloride, sodium chloride, acetaminophen, clonazePAM, nitroGLYCERIN, ondansetron (ZOFRAN) IV, sodium chloride flush   Vital Signs    Vitals:   09/02/16 0550 09/02/16 0834 09/02/16 0835 09/02/16 0852  BP: (!) 149/78 (!) 149/78 (!) 149/78 (!) 144/84  Pulse: 86 81  80  Resp: 17   (!) 25  Temp: 97.8 F (36.6 C)   98.2 F (36.8 C)  TempSrc: Oral   Oral  SpO2: 97%   97%  Weight: 234 lb 14.4 oz (106.5 kg)     Height:        Intake/Output Summary (Last 24 hours) at 09/02/16 0954 Last data filed at 09/02/16 0854  Gross per 24 hour  Intake             1086 ml  Output              300 ml  Net              786 ml   Filed Weights   08/31/16 1541 09/01/16 0500 09/02/16 0550  Weight: 236 lb 9.6 oz (107.3 kg) 236 lb 6.4 oz (107.2 kg) 234 lb 14.4 oz (106.5 kg)    Telemetry  PVC's, occasional couplets - Personally Reviewed  ECG    ST depression laterally - Personally Reviewed  Physical Exam   GEN: Well nourished, well developed, in no acute distress  HEENT: normal  Neck: no JVD, carotid bruits, or masses Cardiac: RRR; no murmurs, rubs, or gallops,no edema  Respiratory:  clear to auscultation bilaterally, normal work of  breathing GI: soft, nontender, nondistended, + BS, obese MS: no deformity or atrophy  Skin: warm and dry, no rash Neuro:  Alert and Oriented x 3, Strength and sensation are intact Psych: euthymic mood, full affect   Labs    Chemistry  Recent Labs Lab 08/31/16 1050 09/01/16 1048  NA 139  --   K 4.0  --   CL 106  --   CO2 26  --   GLUCOSE 105*  --   BUN 17  --   CREATININE 0.89 0.88  CALCIUM 9.5  --   PROT 7.0  --   ALBUMIN 4.2  --   AST 22  --   ALT 14  --   ALKPHOS 63  --   BILITOT 0.6  --   GFRNONAA >60 >60  GFRAA >60 >60  ANIONGAP 7  --      Hematology  Recent Labs Lab 09/01/16 0329 09/01/16 1048 09/02/16 0358  WBC 7.2 8.2 7.4  RBC 4.18 4.30 4.20  HGB 12.0 13.0 12.1  HCT  38.2 38.7 38.2  MCV 91.4 90.0 91.0  MCH 28.7 30.2 28.8  MCHC 31.4 33.6 31.7  RDW 13.2 13.3 13.2  PLT 232 236 236    Cardiac Enzymes  Recent Labs Lab 08/31/16 1050 08/31/16 1413 08/31/16 1852 09/01/16 0037  TROPONINI 0.27* 0.67* 3.11* 4.60*     Recent Labs Lab 08/31/16 1235  TROPIPOC 0.50*     BNPNo results for input(s): BNP, PROBNP in the last 168 hours.   DDimer   Recent Labs Lab 08/31/16 1852  DDIMER 0.47     Radiology    Dg Chest Port 1 View  Result Date: 08/31/2016 CLINICAL DATA:  LEFT side chest pain and heaviness, slight shortness of breath, code STEMI, hypertension, hyperlipidemia EXAM: PORTABLE CHEST 1 VIEW COMPARISON:  Portable exam 1050 hours compared to 07/02/2013 FINDINGS: Minimal enlargement of cardiac silhouette. Mild tortuosity of thoracic aorta. Mediastinal contours and pulmonary vascularity normal. Lungs grossly clear. No pleural effusion or pneumothorax. Bones demineralized. IMPRESSION: Minimal enlargement of cardiac silhouette. No acute abnormalities. Electronically Signed   By: Lavonia Dana M.D.   On: 08/31/2016 10:58    Cardiac Studies   Cardiac cath 09/02/16  There is moderate left ventricular systolic dysfunction.  LV end diastolic  pressure is mildly elevated.  There is no mitral valve regurgitation.  Mid LAD lesion, 20 %stenosed.   1. Mild nonobstructive coronary artery disease. 2. Moderately reduced LV systolic function with an EF of 35%. Wall motion abnormalities suggestive of stress-induced cardiomyopathy. 3. Mildly elevated left ventricular end-diastolic pressure.  Recommendations: Medical therapy with a beta blocker and an ACE inhibitor. The patient was noted to have frequent PVCs throughout the case. I increased the dose of Toprol to 50 mg daily.   Cardiac cath 06/20/14 LM: No angiographically significant CAD. LAD: No angiographically significant CAD.  LCx: No angiographically significant CAD.  Right coronary artery (RCA): No angiographically significant CAD.  EF:  60%. LVEDP 19 mmHg  Chest CTA 06/12/14 IMPRESSION: No evidence of pulmonary emboli. Changes of prior granulomatous disease. No other focal abnormality is seen.  Event Monitor 05/30/13 No AFib  TEE 03/14/13 - EF 60-65%. - Mild MR - No LA or LAA clot - No RA clot. - Atrial septum: small patent foramen ovale. Redundant intraatrial septum.  Carotid US 03/10/13 Bilateral: 1-39% ICA stenosis  Echocardiogram 03/09/13 - EF 55-60%. Wall motion was normal - Mitral valve: Mild regurgitation. - Left atrium: The atrium was mildly dilated.   Patient Profile     76 y.o. 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   - increased Toprol to 50. If needed can go to 100 as outpatient.   - added statin.   - EF now 35% should improve over next week.   Prior CVA  - TPA 11/14  Hyper trigs  - fibrates  - statin added  Essential hypertension  - no changes.  Ok for [employer] with toc in 7-14 days  Signed, Candee Furbish, MD  09/02/2016, 9:54 AM

## 2016-09-02 NOTE — Discharge Summary (Signed)
Discharge Summary    Patient ID: Aimee Hood,  MRN: [id number], DOB/AGE: [id number] y.o.  Admit date: 08/31/2016 Discharge date: 09/02/2016  Primary Care Provider: Kristopher Glee. Primary Cardiologist: Dr. Marlou Porch  Discharge Diagnoses    Principal Problem:   NSTEMI (non-ST elevated myocardial infarction) [facility]) Active Problems:   Essential hypertension   History of LMCA CVA-TPA 2014   Chest pain with moderate risk of acute coronary syndrome   Normal coronary arteries-2016   Takotsubo cardiomyopathy   History of Present Illness     Aimee Hood is a 76 y.o. female with past medical history HTN, HLD, DJD, CVA (occuring in 02/2013), and normal cors by cath in 06/2014 who presented to [facility] on 08/31/2016 for evaluation of chest pain which occurred after after taking a shower. Her initial EKG showed ST elevated along the inferior leads and CODE STEMI was activated, but upon arrival to [facility], her symptoms were felt to be atypical and not consistent with a STEMI and her chest pain had resolved. Her initial troponin was elevated to 0.27, therefore she was started on Heparin and admitted for further observation.   Hospital Course     Consultants: None   The following morning, she denied any recurrent chest discomfort. With her troponin values continuing to trend upwards to 4.60, she was scheduled for a cardiac catheterization later that day. Her cath showed mild nonobstructive CAD with only 20% mid-LAD stenosis. EF was reduced to 35% and wall motion abnormalities were suggestive of a stress-induced cardiomyopathy. Medical therapy was recommended with a BB and ACE-I. Her Toprol-XL was increased from 25mg  daily to 50mg  daily in the setting of frequent PVC's.   On 09/02/2016, she denied any recurrent chest pain or dyspnea. She will continue on Toprol-XL 50mg  daily and Ramipril 10mg  daily. Was on Crestor 20mg  daily prior to admission, therefore this was restarted at the  time of discharge. Will remain on ASA 325mg  daily with prior CVA (defer to Neurology to reduce to 81mg  daily). She was last examined by Dr. Marlou Porch and deemed stable for discharge. 2-week Cardiology follow-up has been arranged. She was discharged home in good condition.   _____________  Discharge Vitals Blood pressure (!) 144/84, pulse 80, temperature 98.2 F (36.8 C), temperature source Oral, resp. rate (!) 25, height 5\' 6"  (1.676 m), weight 234 lb 14.4 oz (106.5 kg), SpO2 97 %.  Filed Weights   08/31/16 1541 09/01/16 0500 09/02/16 0550  Weight: 236 lb 9.6 oz (107.3 kg) 236 lb 6.4 oz (107.2 kg) 234 lb 14.4 oz (106.5 kg)    Labs & Radiologic Studies     CBC  Recent Labs  08/31/16 1050  09/01/16 1048 09/02/16 0358  WBC 8.9  < > 8.2 7.4  NEUTROABS 6.8  --   --   --   HGB 13.3  < > 13.0 12.1  HCT 40.2  < > 38.7 38.2  MCV 90.1  < > 90.0 91.0  PLT 260  < > 236 236  < > = values in this interval not displayed. Basic Metabolic Panel  Recent Labs  08/31/16 1050 09/01/16 1048  NA 139  --   K 4.0  --   CL 106  --   CO2 26  --   GLUCOSE 105*  --   BUN 17  --   CREATININE 0.89 0.88  CALCIUM 9.5  --    Liver Function Tests  Recent Labs  08/31/16 1050  AST 22  ALT 14  ALKPHOS 63  BILITOT 0.6  PROT 7.0  ALBUMIN 4.2   No results for input(s): LIPASE, AMYLASE in the last 72 hours. Cardiac Enzymes  Recent Labs  08/31/16 1413 08/31/16 1852 09/01/16 0037  TROPONINI 0.67* 3.11* 4.60*   BNP Invalid input(s): POCBNP D-Dimer  Recent Labs  08/31/16 1852  DDIMER 0.47   Hemoglobin A1C No results for input(s): HGBA1C in the last 72 hours. Fasting Lipid Panel No results for input(s): CHOL, HDL, LDLCALC, TRIG, CHOLHDL, LDLDIRECT in the last 72 hours. Thyroid Function Tests No results for input(s): TSH, T4TOTAL, T3FREE, THYROIDAB in the last 72 hours.  Invalid input(s): FREET3  Dg Chest Port 1 View  Result Date: 08/31/2016 CLINICAL DATA:  LEFT side chest pain and  heaviness, slight shortness of breath, code STEMI, hypertension, hyperlipidemia EXAM: PORTABLE CHEST 1 VIEW COMPARISON:  Portable exam 1050 hours compared to 07/02/2013 FINDINGS: Minimal enlargement of cardiac silhouette. Mild tortuosity of thoracic aorta. Mediastinal contours and pulmonary vascularity normal. Lungs grossly clear. No pleural effusion or pneumothorax. Bones demineralized. IMPRESSION: Minimal enlargement of cardiac silhouette. No acute abnormalities. Electronically Signed   By: Lavonia Dana M.D.   On: 08/31/2016 10:58    Diagnostic Studies/Procedures     Cardiac Catheterization: 09/01/2016  There is moderate left ventricular systolic dysfunction.  LV end diastolic pressure is mildly elevated.  There is no mitral valve regurgitation.  Mid LAD lesion, 20 %stenosed.   1. Mild nonobstructive coronary artery disease. 2. Moderately reduced LV systolic function with an EF of 35%. Wall motion abnormalities suggestive of stress-induced cardiomyopathy. 3. Mildly elevated left ventricular end-diastolic pressure.  Recommendations: Medical therapy with a beta blocker and an ACE inhibitor. The patient was noted to have frequent PVCs throughout the case. I increased the dose of Toprol to 50 mg daily.  Disposition   Pt is being discharged home today in good condition.  Follow-up Plans & Appointments    Follow-up Information    [name], PA-C Follow up.   Specialties:  Cardiology, Radiology Why:  Cardiology Hospital Follow-Up on 09/15/2016 at 9:30AM.  Contact information: [address] [place] [address] [phone]          Discharge Instructions    Diet - low sodium heart healthy    Complete by:  As directed    Discharge instructions    Complete by:  As directed    [place].  PLEASE ATTEND ALL SCHEDULED FOLLOW-UP APPOINTMENTS.   Activity: Increase activity  slowly as tolerated. You may shower, but no soaking baths (or swimming) for 1 week. No driving for 24 hours. No lifting over 5 lbs for 1 week. No sexual activity for 1 week.   You May Return to Work: in 1 week (if applicable)  Wound Care: You may wash cath site gently with soap and water. Keep cath site clean and dry. If you notice pain, swelling, bleeding or pus at your cath site, please call [phone].   Increase activity slowly    Complete by:  As directed       Discharge Medications     Medication List    TAKE these medications   acetaminophen 500 MG tablet Commonly known as:  TYLENOL Take 1,000 mg by mouth every 6 (six) hours as needed for mild pain.   aspirin 325 MG tablet Take 325 mg by mouth daily.   cholecalciferol 1000  units tablet Commonly known as:  VITAMIN D Take 2,000 Units by mouth daily.   clonazePAM 0.5 MG tablet Commonly known as:  KLONOPIN Take 1 tablet at bedtime for restless legs What changed:  how much to take  how to take this  when to take this  reasons to take this  additional instructions   DULoxetine 60 MG capsule Commonly known as:  CYMBALTA Take 60 mg by mouth daily.   fenofibrate micronized 134 MG capsule Commonly known as:  LOFIBRA TAKE 1 CAPSULE (134 MG TOTAL) BY MOUTH DAILY BEFORE BREAKFAST.   metoprolol succinate 50 MG 24 hr tablet Commonly known as:  TOPROL-XL Take 1 tablet (50 mg total) by mouth daily. What changed:  medication strength  how much to take   nitroGLYCERIN 0.4 MG SL tablet Commonly known as:  NITROSTAT Place 1 tablet (0.4 mg total) under the tongue every 5 (five) minutes as needed for chest pain.   omeprazole 20 MG capsule Commonly known as:  PRILOSEC Take 1 capsule (20 mg total) by mouth daily. What changed:  when to take this  reasons to take this   ramipril 10 MG capsule Commonly known as:  ALTACE Take 1 capsule (10 mg total) by mouth daily.   rosuvastatin 20 MG tablet Commonly known  as:  CRESTOR Take 20 mg by mouth daily.       Aspirin prescribed at discharge?  Yes High Intensity Statin Prescribed? (Lipitor 40-80mg  or Crestor 20-40mg ): Yes Beta Blocker Prescribed? Yes For EF 40% or less, Was ACEI/ARB Prescribed? Yes ADP Receptor Inhibitor Prescribed? (i.e. Plavix etc.-Includes Medically Managed Patients): No: Stress-induced.  For EF <40%, Aldosterone Inhibitor Prescribed? No: Stress-induced Cardiomyopathy Was EF assessed during THIS hospitalization? Yes Was Cardiac Rehab II ordered? (Included Medically managed Patients): No: Stress-induced Cardiomyopathy   Allergies Allergies  Allergen Reactions  . Nickel     Discovered allergy post knee surgery  . Codeine Rash     Outstanding Labs/Studies   Repeat Echocardiogram in 2-3 months.  Duration of Discharge Encounter   Greater than 30 minutes including physician time.  Signed, Erma Heritage, PA-C 09/02/2016, 10:47 AM   Personally seen and examined. Agree with above. Subjective   Prior CP, no SOB. Currently without symptoms. Cath with no significant CAD.    Inpatient Medications    Scheduled Meds: . aspirin EC  81 mg Oral Daily  . atorvastatin  40 mg Oral q1800  . enoxaparin (LOVENOX) injection  40 mg Subcutaneous Q24H  . fenofibrate  54 mg Oral Daily  . LORazepam  0.5 mg Oral QHS  . metoprolol succinate  50 mg Oral Daily  . nitroGLYCERIN  0.5 inch Topical Q6H  . pantoprazole  40 mg Oral Daily  . ramipril  5 mg Oral Daily  . sodium chloride flush  3 mL Intravenous Q12H   Continuous Infusions: . sodium chloride 10 mL/hr (08/31/16 1250)  . sodium chloride    . sodium chloride     PRN Meds: sodium chloride, sodium chloride, acetaminophen, clonazePAM, nitroGLYCERIN, ondansetron (ZOFRAN) IV, sodium chloride flush   Vital Signs          Vitals:   09/02/16 0550 09/02/16 0834 09/02/16 0835 09/02/16 0852  BP: (!) 149/78 (!) 149/78 (!) 149/78 (!) 144/84  Pulse: 86 81  80  Resp: 17    (!) 25  Temp: 97.8 F (36.6 C)   98.2 F (36.8 C)  TempSrc: Oral   Oral  SpO2: 97%   97%  Weight: 234  lb 14.4 oz (106.5 kg)     Height:        Intake/Output Summary (Last 24 hours) at 09/02/16 0954 Last data filed at 09/02/16 0854  Gross per 24 hour  Intake             1086 ml  Output              300 ml  Net              786 ml        Filed Weights   08/31/16 1541 09/01/16 0500 09/02/16 0550  Weight: 236 lb 9.6 oz (107.3 kg) 236 lb 6.4 oz (107.2 kg) 234 lb 14.4 oz (106.5 kg)    Telemetry  PVC's, occasional couplets - Personally Reviewed  ECG    ST depression laterally - Personally Reviewed  Physical Exam   GEN: Well nourished, well developed, in no acute distress  HEENT: normal  Neck: no JVD, carotid bruits, or masses Cardiac: RRR; no murmurs, rubs, or gallops,no edema  Respiratory:  clear to auscultation bilaterally, normal work of breathing GI: soft, nontender, nondistended, + BS, obese MS: no deformity or atrophy  Skin: warm and dry, no rash Neuro:  Alert and Oriented x 3, Strength and sensation are intact Psych: euthymic mood, full affect   Labs    Chemistry  Last Labs    Recent Labs Lab 08/31/16 1050 09/01/16 1048  NA 139  --   K 4.0  --   CL 106  --   CO2 26  --   GLUCOSE 105*  --   BUN 17  --   CREATININE 0.89 0.88  CALCIUM 9.5  --   PROT 7.0  --   ALBUMIN 4.2  --   AST 22  --   ALT 14  --   ALKPHOS 63  --   BILITOT 0.6  --   GFRNONAA >60 >60  GFRAA >60 >60  ANIONGAP 7  --        Hematology  Last Labs    Recent Labs Lab 09/01/16 0329 09/01/16 1048 09/02/16 0358  WBC 7.2 8.2 7.4  RBC 4.18 4.30 4.20  HGB 12.0 13.0 12.1  HCT 38.2 38.7 38.2  MCV 91.4 90.0 91.0  MCH 28.7 30.2 28.8  MCHC 31.4 33.6 31.7  RDW 13.2 13.3 13.2  PLT 232 236 236      Cardiac Enzymes  Last Labs    Recent Labs Lab 08/31/16 1050 08/31/16 1413 08/31/16 1852 09/01/16 0037  TROPONINI 0.27* 0.67* 3.11*  4.60*       Last Labs    Recent Labs Lab 08/31/16 1235  TROPIPOC 0.50*       BNP Last Labs   No results for input(s): BNP, PROBNP in the last 168 hours.     DDimer   Last Labs    Recent Labs Lab 08/31/16 9211  DDIMER 0.47       Radiology     Imaging Results (Last 48 hours)  Dg Chest Port 1 View  Result Date: 08/31/2016 CLINICAL DATA:  LEFT side chest pain and heaviness, slight shortness of breath, code STEMI, hypertension, hyperlipidemia EXAM: PORTABLE CHEST 1 VIEW COMPARISON:  Portable exam 1050 hours compared to 07/02/2013 FINDINGS: Minimal enlargement of cardiac silhouette. Mild tortuosity of thoracic aorta. Mediastinal contours and pulmonary vascularity normal. Lungs grossly clear. No pleural effusion or pneumothorax. Bones demineralized. IMPRESSION: Minimal enlargement of cardiac silhouette. No acute abnormalities. Electronically Signed   By: Elta Guadeloupe  Thornton Papas M.D.   On: 08/31/2016 10:58     Cardiac Studies   Cardiac cath 09/02/16  There is moderate left ventricular systolic dysfunction.  LV end diastolic pressure is mildly elevated.  There is no mitral valve regurgitation.  Mid LAD lesion, 20 %stenosed.  1. Mild nonobstructive coronary artery disease. 2. Moderately reduced LV systolic function with an EF of 35%. Wall motion abnormalities suggestive of stress-induced cardiomyopathy. 3. Mildly elevated left ventricular end-diastolic pressure.  Recommendations: Medical therapy with a beta blocker and an ACE inhibitor. The patient was noted to have frequent PVCs throughout the case. I increased the dose of Toprol to 50 mg daily.   Cardiac cath 06/20/14 LM: No angiographically significant CAD. LAD: No angiographically significant CAD.  LCx: No angiographically significant CAD.  Right coronary artery (RCA): No angiographically significant CAD.  EF: 60%. LVEDP 19 mmHg  Chest CTA 06/12/14 IMPRESSION: No evidence of pulmonary emboli. Changes of  prior granulomatous disease. No other focal abnormality is seen.  Event Monitor 05/30/13 No AFib  TEE 03/14/13 - EF 60-65%. - Mild MR - No LA or LAA clot - No RA clot. - Atrial septum: small patent foramen ovale. Redundant intraatrial septum.  Carotid US 03/10/13 Bilateral: 1-39% ICA stenosis  Echocardiogram 03/09/13 - EF 55-60%. Wall motion was normal - Mitral valve: Mild regurgitation. - Left atrium: The atrium was mildly dilated.   Patient Profile     76 y.o. 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   - increased Toprol to 50. If needed can go to 100 as outpatient.   - added statin.   - EF now 35% should improve over next week.   Prior CVA  - TPA 11/14  Hyper trigs  - fibrates  - statin added  Essential hypertension  - no changes.  Ok for [employer] with toc in 7-14 days  Signed, Candee Furbish, MD

## 2016-09-02 NOTE — Discharge Instructions (Signed)
Radial Site Care °Refer to this sheet in the next few weeks. These instructions provide you with information about caring for yourself after your procedure. Your health care provider may also give you more specific instructions. Your treatment has been planned according to current medical practices, but problems sometimes occur. Call your health care provider if you have any problems or questions after your procedure. °What can I expect after the procedure? °After your procedure, it is typical to have the following: °· Bruising at the radial site that usually fades within 1-2 weeks. °· Blood collecting in the tissue (hematoma) that may be painful to the touch. It should usually decrease in size and tenderness within 1-2 weeks. °Follow these instructions at home: °· Take medicines only as directed by your health care provider. °· You may shower 24-48 hours after the procedure or as directed by your health care provider. Remove the bandage (dressing) and gently wash the site with plain soap and water. Pat the area dry with a clean towel. Do not rub the site, because this may cause bleeding. °· Do not take baths, swim, or use a hot tub until your health care provider approves. °· Check your insertion site every day for redness, swelling, or drainage. °· Do not apply powder or lotion to the site. °· Do not flex or bend the affected arm for 24 hours or as directed by your health care provider. °· Do not push or pull heavy objects with the affected arm for 24 hours or as directed by your health care provider. °· Do not lift over 10 lb (4.5 kg) for 5 days after your procedure or as directed by your health care provider. °· Ask your health care provider when it is okay to: °¨ Return to work or school. °¨ Resume usual physical activities or sports. °¨ Resume sexual activity. °· Do not drive home if you are discharged the same day as the procedure. Have someone else drive you. °· You may drive 24 hours after the procedure  unless otherwise instructed by your health care provider. °· Do not operate machinery or power tools for 24 hours after the procedure. °· If your procedure was done as an outpatient procedure, which means that you went home the same day as your procedure, a responsible adult should be with you for the first 24 hours after you arrive home. °· Keep all follow-up visits as directed by your health care provider. This is important. °Contact a health care provider if: °· You have a fever. °· You have chills. °· You have increased bleeding from the radial site. Hold pressure on the site. °Get help right away if: °· You have unusual pain at the radial site. °· You have redness, warmth, or swelling at the radial site. °· You have drainage (other than a small amount of blood on the dressing) from the radial site. °· The radial site is bleeding, and the bleeding does not stop after 30 minutes of holding steady pressure on the site. °· Your arm or hand becomes pale, cool, tingly, or numb. °This information is not intended to replace advice given to you by your health care provider. Make sure you discuss any questions you have with your health care provider. °Document Released: 05/01/2010 Document Revised: 09/04/2015 Document Reviewed: 10/15/2013 °[employer] Interactive Patient Education © 2017 [employer] ° °

## 2016-09-14 NOTE — Progress Notes (Signed)
Cardiology Office Note    Date:  09/15/2016   ID:  Aimee Hood, Aimee Hood 05/01/1940, MRN [id number]  PCP:  Kristopher Glee., MD  Cardiologist:  Dr. Marlou Porch  CC: post hospital follow up     History of Present Illness:  Aimee Hood is a 76 y.o. female with a history of HTN, HLD, DJD, CVA (occuring in 02/2013), and normal cors by cath in 06/2014 and recently admission for stress induced CM who presents to clinic for post hospital follow up.  She was recently admitted 5/22-5/24/18 for chest pain. She was initially called a code STEMI but her chest pain was felt to be atypical and was called off. Her troponin peaked at 4.60. She underwent cath which showed mild nonobstructive CAD with only 20% mid-LAD stenosis. EF was reduced to 35% and wall motion abnormalities were suggestive of a stress-induced cardiomyopathy. Medical therapy was recommended with a BB and ACE-I. Her Toprol-XL was increased from 25mg  daily to 50mg  daily in the setting of frequent PVC's. She was continued on ASA 325mg  daily given history of CVA.  Today she presents to clinic for follow up. She has not had any chest pain but has felt weak. She has also been having cold sweats and feeling clammy. Also feels like her heart is jumping or fluttering that lasts seconds to minutes and is associated with SOB. This occurs almost daily. She occasionally gets some LE edema, but no orthopnea or PND. No dizziness or syncope. No blood in stool or urine.     Past Medical History:  Diagnosis Date  . Allergy   . Arthritis    "all over" (07/10/2013)  . CAD (coronary artery disease)    a. normal cors by cath in 2016  b. 08/2016: admitted with an NSTEMI. Cath showed mild nonobstructive CAD with only 20% mid-LAD stenosis. EF was reduced to 35% and wall motion abnormalities were suggestive of a stress-induced cardiomyopathy.  . Carpal tunnel syndrome of left wrist   . Cerebral aneurysm, nonruptured    3-4 mm in size, stable since 2004 (most recent  MRI 2014)  . Colon polyp 2007  . Diverticulitis   . Family history of anesthesia complication    "brother had PONV"  . Gastroesophageal reflux disease   . High cholesterol   . Hypertension   . Insomnia   . Macular degeneration 08/31/2016  . PONV (postoperative nausea and vomiting)   . Restless leg syndrome   . Stroke [facility]) 02/2013   denies residual on 07/10/2013)    Past Surgical History:  Procedure Laterality Date  . BREAST SURGERY    . CHOLECYSTECTOMY    . JOINT REPLACEMENT    . KNEE ARTHROSCOPY Bilateral   . LEFT HEART CATH AND CORONARY ANGIOGRAPHY N/A 09/01/2016   Procedure: Left Heart Cath and Coronary Angiography;  Surgeon: Wellington Hampshire, MD;  Location: [place] CV [facility];  Service: Cardiovascular;  Laterality: N/A;  . LEFT HEART CATHETERIZATION WITH CORONARY ANGIOGRAM N/A 06/20/2014   Procedure: LEFT HEART CATHETERIZATION WITH CORONARY ANGIOGRAM;  Surgeon: [name], MD;  Location: [facility] CATH LAB;  Service: Cardiovascular;  Laterality: N/A;  . TEE WITHOUT CARDIOVERSION N/A 03/14/2013   Procedure: TRANSESOPHAGEAL ECHOCARDIOGRAM (TEE);  Surgeon: Candee Furbish, MD;  Location: [facility] ENDOSCOPY;  Service: Cardiovascular;  Laterality: N/A;  . TOTAL KNEE ARTHROPLASTY Left 07/10/2013  . TOTAL KNEE ARTHROPLASTY Left 07/10/2013   Procedure: TOTAL KNEE ARTHROPLASTY;  Surgeon: Garald Balding, MD;  Location: [place];  Service: Orthopedics;  Laterality: Left;  Marland Kitchen VAGINAL HYSTERECTOMY  2000's    Current Medications: Outpatient Medications Prior to Visit  Medication Sig Dispense Refill  . acetaminophen (TYLENOL) 500 MG tablet Take 1,000 mg by mouth every 6 (six) hours as needed for mild pain.    . cholecalciferol (VITAMIN D) 1000 UNITS tablet Take 2,000 Units by mouth daily.    . DULoxetine (CYMBALTA) 60 MG capsule Take 60 mg by mouth daily.    . fenofibrate micronized (LOFIBRA) 134 MG capsule TAKE 1 CAPSULE (134 MG TOTAL) BY MOUTH DAILY BEFORE BREAKFAST. 90 capsule 1  . metoprolol succinate  (TOPROL-XL) 50 MG 24 hr tablet Take 1 tablet (50 mg total) by mouth daily. 30 tablet 5  . ramipril (ALTACE) 10 MG capsule Take 1 capsule (10 mg total) by mouth daily. 30 capsule 5  . rosuvastatin (CRESTOR) 20 MG tablet Take 20 mg by mouth daily.    Marland Kitchen aspirin 325 MG tablet Take 325 mg by mouth daily.    . nitroGLYCERIN (NITROSTAT) 0.4 MG SL tablet Place 1 tablet (0.4 mg total) under the tongue every 5 (five) minutes as needed for chest pain. 25 tablet 3  . clonazePAM (KLONOPIN) 0.5 MG tablet Take 1 tablet at bedtime for restless legs (Patient taking differently: Take 0.5 mg by mouth daily as needed (restless legs). Take 1 tablet at bedtime for restless legs) 90 tablet 1  . omeprazole (PRILOSEC) 20 MG capsule Take 1 capsule (20 mg total) by mouth daily. (Patient taking differently: Take 20 mg by mouth daily as needed (for indigestion). ) 90 capsule 3   No facility-administered medications prior to visit.      Allergies:   Nickel and Codeine   Social History   Social History  . Marital status: Married    Spouse name: N/A  . Number of children: N/A  . Years of education: N/A   Occupational History  . hairdresser [place]   Social History Main Topics  . Smoking status: Never Smoker  . Smokeless tobacco: Never Used  . Alcohol use No  . Drug use: No  . Sexual activity: Not Currently   Other Topics Concern  . None   Social History Narrative  . None     Family History:  The patient's family history includes Cancer in her brother, father, maternal grandmother, and sister; Diabetes in her paternal aunt; Heart attack in her brother; Heart disease (age of onset: 72) in her brother; Hypertension in her father.      ROS:   Please see the history of present illness.    ROS All other systems reviewed and are negative.   PHYSICAL EXAM:   VS:  BP 122/70   Pulse (!) 58   Ht 5\' 6"  (1.676 m)   Wt 239 lb 6.4 oz (108.6 kg)   BMI 38.64 kg/m    GEN: Well nourished, well  developed, in no acute distress  HEENT: normal  Neck: no JVD, carotid bruits, or masses Cardiac: RRR; no murmurs, rubs, or gallops,no edema  Respiratory:  clear to auscultation bilaterally, normal work of breathing GI: soft, nontender, nondistended, + BS MS: no deformity or atrophy  Skin: warm and dry, no rash Neuro:  Alert and Oriented x 3, Strength and sensation are intact Psych: euthymic mood, full affect   Wt Readings from Last 3 Encounters:  09/15/16 239 lb 6.4 oz (108.6 kg)  09/02/16 234 lb 14.4 oz (106.5 kg)  07/03/14 256 lb (116.1 kg)  Studies/Labs Reviewed:   EKG:  EKG is NOT ordered today.    Recent Labs: 08/31/2016: ALT 14; BUN 17; Potassium 4.0; Sodium 139 09/01/2016: Creatinine, Ser 0.88 09/02/2016: Hemoglobin 12.1; Platelets 236   Lipid Panel    Component Value Date/Time   CHOL 133 11/26/2013 0955   TRIG 138.0 11/26/2013 0955   HDL 43.00 11/26/2013 0955   CHOLHDL 3 11/26/2013 0955   VLDL 27.6 11/26/2013 0955   LDLCALC 62 11/26/2013 0955    Additional studies/ records that were reviewed today include:  Cardiac cath 09/02/16  There is moderate left ventricular systolic dysfunction.  LV end diastolic pressure is mildly elevated.  There is no mitral valve regurgitation.  Mid LAD lesion, 20 %stenosed.  1. Mild nonobstructive coronary artery disease. 2. Moderately reduced LV systolic function with an EF of 35%. Wall motion abnormalities suggestive of stress-induced cardiomyopathy. 3. Mildly elevated left ventricular end-diastolic pressure.  Recommendations: Medical therapy with a beta blocker and an ACE inhibitor. The patient was noted to have frequent PVCs throughout the case. I increased the dose of Toprol to 50 mg daily.   Cardiac cath 06/20/14 LM: No angiographically significant CAD. LAD: No angiographically significant CAD.  LCx: No angiographically significant CAD.  Right coronary artery (RCA): No angiographically significant CAD.  EF:  60%. LVEDP 19 mmHg  Chest CTA 06/12/14 IMPRESSION: No evidence of pulmonary emboli. Changes of prior granulomatous disease. No other focal abnormality is seen.  Event Monitor 05/30/13 No AFib  TEE 03/14/13 - EF 60-65%. - Mild MR - No LA or LAA clot - No RA clot. - Atrial septum: small patent foramen ovale. Redundant intraatrial septum.  Carotid US 03/10/13 Bilateral: 1-39% ICA stenosis  Echocardiogram 03/09/13 - EF 55-60%. Wall motion was normal - Mitral valve: Mild regurgitation. - Left atrium: The atrium was mildly dilated.    ASSESSMENT & PLAN:   Stress induced CM: appears euvolemic today but has had some intermittent LE edema. Will check BMET and BNP and add diuretic if BNP elevated. Will continue Toprol XL 50mg  daily and ramipril 10mg  daily.   Hx of CVA: she has been on ASA 325mg  daily. I will decrease this to 81 mg daily as CVA occurred over 4 years ago  HLD with hypertriglyceridemia: continue statin and fibrate  HTN: BP well controlled today   Fatigue: will check a TSH and CBC  Palpitations: she was noted have frequent PVCs while admitted and Toprol increased. She is having frequent palpitations. I will get a 48 hour holter to quantify PVCs and further evaluated palpitations.    Medication Adjustments/Labs and Tests Ordered: Current medicines are reviewed at length with the patient today.  Concerns regarding medicines are outlined above.  Medication changes, Labs and Tests ordered today are listed in the Patient Instructions below. Patient Instructions  Medication Instructions:  Your physician has recommended you make the following change in your medication:  1.  CHANGE the Aspirin to 81 mg daily  Labwork: TODAY:  BMP, PRO BNP, TSH, & CBC  Testing/Procedures: Your physician has requested that you have an echocardiogram IN THE MIDDLE - THE END OF AUGUST.  Echocardiography is a painless test that uses sound waves to create images of your heart. It  provides your doctor with information about the size and shape of your heart and how well your heart's chambers and valves are working. This procedure takes approximately one hour. There are no restrictions for this procedure.   Your physician has recommended that you wear an event monitor.  Event monitors are medical devices that record the heart's electrical activity. Doctors most often [place] these monitors to diagnose arrhythmias. Arrhythmias are problems with the speed or rhythm of the heartbeat. The monitor is a small, portable device. You can wear one while you do your normal daily activities. This is usually used to diagnose what is causing palpitations/syncope (passing out).   Follow-Up: Your physician recommends that you schedule a follow-up appointment in: WITH DR. Marlou Porch AFTER THE ECHOCARDIOGRAM    Any Other Special Instructions Will Be Listed Below (If Applicable).  Echocardiogram An echocardiogram, or echocardiography, uses sound waves (ultrasound) to produce an image of your heart. The echocardiogram is simple, painless, obtained within a short period of time, and offers valuable information to your health care provider. The images from an echocardiogram can provide information such as:  Evidence of coronary artery disease (CAD).  Heart size.  Heart muscle function.  Heart valve function.  Aneurysm detection.  Evidence of a past heart attack.  Fluid buildup around the heart.  Heart muscle thickening.  Assess heart valve function.  Tell a health care provider about:  Any allergies you have.  All medicines you are taking, including vitamins, herbs, eye drops, creams, and over-the-counter medicines. Any problems you or family m Cardiac Event Monitoring A cardiac event monitor is a small recording device that is used to detect abnormal heart rhythms (arrhythmias). The monitor is used to record your heart rhythm when you have symptoms, such as: Fast heartbeats  (palpitations), such as heart racing or fluttering. Dizziness. Fainting or light-headedness. Unexplained weakness.  Some monitors are wired to electrodes placed on your chest. Electrodes are flat, sticky disks that attach to your skin. Other monitors may be hand-held or worn on the wrist. The monitor can be worn for up to 30 days. If the monitor is attached to your chest, a technician will prepare your chest for the electrode placement and show you how to work the monitor. Take time to practice using the monitor before you leave the office. Make sure you understand how to send the information from the monitor to your health care provider. In some cases, you may need to use a landline telephone instead of a cell phone. What are the risks? Generally, this device is safe to use, but it possible that the skin under the electrodes will become irritated. How to use your cardiac event monitor Wear your monitor at all times, except when you are in water: Do not let the monitor get wet. Take the monitor off when you bathe. Do not swim or use a hot tub with it on. Keep your skin clean. Do not put body lotion or moisturizer on your chest. Change the electrodes as told by your health care provider or any time they stop sticking to your skin. You may need to use medical tape to keep them on. Try to put the electrodes in slightly different places on your chest to help prevent skin irritation. They must remain in the area under your left breast and in the upper right section of your chest. Make sure the monitor is safely clipped to your clothing or in a location close to your body that your health care provider recommends. Press the button to record as soon as you feel heart-related symptoms, such as: Dizziness. Weakness. Light-headedness. Palpitations. Thumping or pounding in your chest. Shortness of breath. Unexplained weakness. Keep a diary of your activities, such as walking, doing chores, and taking  medicine. It is very important  to note what you were doing when you pushed the button to record your symptoms. This will help your health care provider determine what might be contributing to your symptoms. Send the recorded information as recommended by your health care provider. It may take some time for your health care provider to process the results. Change the batteries as told by your health care provider. Keep electronic devices away from your monitor. This includes: Tablets. MP3 players. Cell phones. While wearing your monitor you should avoid: Electric blankets. Armed forces operational officer. Electric toothbrushes. Microwave ovens. Magnets. Metal detectors. Get help right away if: You have chest pain. You have extreme difficulty breathing or shortness of breath. You develop a very fast heartbeat that persists. You develop dizziness that does not go away. You faint or constantly feel like you are about to faint. Summary A cardiac event monitor is a small recording device that is used to help detect abnormal heart rhythms (arrhythmias). The monitor is used to record your heart rhythm when you have heart-related symptoms. Make sure you understand how to send the information from the monitor to your health care provider. It is important to press the button on the monitor when you have any heart-related symptoms. Keep a diary of your activities, such as walking, doing chores, and taking medicine. It is very important to note what you were doing when you pushed the button to record your symptoms. This will help your health care provider learn what might be causing your symptoms. This information is not intended to replace advice given to you by your health care provider. Make sure you discuss any questions you have with your health care provider. Document Released: 01/06/2008 Document Revised: 03/13/2016 Document Reviewed: 03/13/2016 Elsevier Interactive Patient Education  2017 [place] have had with anesthetic medicines.  Any blood disorders you have.  Any surgeries you have had.  Any medical conditions you have.  Whether you are pregnant or may be pregnant. What happens before the procedure? No special preparation is needed. Eat and drink normally. What happens during the procedure?  In order to produce an image of your heart, gel will be applied to your chest and a wand-like tool (transducer) will be moved over your chest. The gel will help transmit the sound waves from the transducer. The sound waves will harmlessly bounce off your heart to allow the heart images to be captured in real-time motion. These images will then be recorded.  You may need an IV to receive a medicine that improves the quality of the pictures. What happens after the procedure? You may return to your normal schedule including diet, activities, and medicines, unless your health care provider tells you otherwise. This information is not intended to replace advice given to you by your health care provider. Make sure you discuss any questions you have with your health care provider. Document Released: 03/26/2000 Document Revised: 11/15/2015 Document Reviewed: 12/04/2012 Elsevier Interactive Patient Education  2017 [employer].    If you need a refill on your cardiac medications before your next appointment, please call your pharmacy. ]    Signed, Angelena Form, PA-C  09/15/2016 10:21 AM    [address], [place], [place]  [address] Phone: [phone]; Fax: [phone]

## 2016-09-15 ENCOUNTER — Encounter: Payer: Self-pay | Admitting: Physician Assistant

## 2016-09-15 ENCOUNTER — Other Ambulatory Visit: Payer: Self-pay | Admitting: Physician Assistant

## 2016-09-15 ENCOUNTER — Ambulatory Visit (INDEPENDENT_AMBULATORY_CARE_PROVIDER_SITE_OTHER): Payer: PPO | Admitting: Physician Assistant

## 2016-09-15 VITALS — BP 122/70 | HR 58 | Ht 66.0 in | Wt 239.4 lb

## 2016-09-15 DIAGNOSIS — I493 Ventricular premature depolarization: Secondary | ICD-10-CM

## 2016-09-15 DIAGNOSIS — R002 Palpitations: Secondary | ICD-10-CM

## 2016-09-15 DIAGNOSIS — R5383 Other fatigue: Secondary | ICD-10-CM | POA: Diagnosis not present

## 2016-09-15 DIAGNOSIS — I5181 Takotsubo syndrome: Secondary | ICD-10-CM

## 2016-09-15 DIAGNOSIS — Z8673 Personal history of transient ischemic attack (TIA), and cerebral infarction without residual deficits: Secondary | ICD-10-CM

## 2016-09-15 DIAGNOSIS — E785 Hyperlipidemia, unspecified: Secondary | ICD-10-CM

## 2016-09-15 DIAGNOSIS — I10 Essential (primary) hypertension: Secondary | ICD-10-CM | POA: Diagnosis not present

## 2016-09-15 MED ORDER — CLONAZEPAM 0.5 MG PO TABS: 0.5000 mg | ORAL_TABLET | Freq: Two times a day (BID) | ORAL | 0 refills | Status: AC | PRN

## 2016-09-15 MED ORDER — ASPIRIN EC 81 MG PO TBEC: 81.0000 mg | DELAYED_RELEASE_TABLET | Freq: Every day | ORAL | 3 refills | Status: DC

## 2016-09-15 NOTE — Addendum Note (Signed)
Addended by: Gaetano Net on: 09/15/2016 10:31 AM   Modules accepted: Orders

## 2016-09-15 NOTE — Patient Instructions (Addendum)
Medication Instructions:  Your physician has recommended you make the following change in your medication:  1.  CHANGE the Aspirin to 81 mg daily  Labwork: TODAY:  BMP, PRO BNP, TSH, & CBC  Testing/Procedures: Your physician has requested that you have an echocardiogram IN THE MIDDLE - THE END OF AUGUST.  Echocardiography is a painless test that uses sound waves to create images of your heart. It provides your doctor with information about the size and shape of your heart and how well your heart's chambers and valves are working. This procedure takes approximately one hour. There are no restrictions for this procedure.   Your physician has recommended that you wear an event monitor. Event monitors are medical devices that record the heart's electrical activity. Doctors most often [place] these monitors to diagnose arrhythmias. Arrhythmias are problems with the speed or rhythm of the heartbeat. The monitor is a small, portable device. You can wear one while you do your normal daily activities. This is usually used to diagnose what is causing palpitations/syncope (passing out).   Follow-Up: Your physician recommends that you schedule a follow-up appointment in: WITH DR. Marlou Porch AFTER THE ECHOCARDIOGRAM    Any Other Special Instructions Will Be Listed Below (If Applicable).  Echocardiogram An echocardiogram, or echocardiography, uses sound waves (ultrasound) to produce an image of your heart. The echocardiogram is simple, painless, obtained within a short period of time, and offers valuable information to your health care provider. The images from an echocardiogram can provide information such as:  Evidence of coronary artery disease (CAD).  Heart size.  Heart muscle function.  Heart valve function.  Aneurysm detection.  Evidence of a past heart attack.  Fluid buildup around the heart.  Heart muscle thickening.  Assess heart valve function.  Tell a health care provider about:  Any  allergies you have.  All medicines you are taking, including vitamins, herbs, eye drops, creams, and over-the-counter medicines. Any problems you or family m Cardiac Event Monitoring A cardiac event monitor is a small recording device that is used to detect abnormal heart rhythms (arrhythmias). The monitor is used to record your heart rhythm when you have symptoms, such as: Fast heartbeats (palpitations), such as heart racing or fluttering. Dizziness. Fainting or light-headedness. Unexplained weakness.  Some monitors are wired to electrodes placed on your chest. Electrodes are flat, sticky disks that attach to your skin. Other monitors may be hand-held or worn on the wrist. The monitor can be worn for up to 30 days. If the monitor is attached to your chest, a technician will prepare your chest for the electrode placement and show you how to work the monitor. Take time to practice using the monitor before you leave the office. Make sure you understand how to send the information from the monitor to your health care provider. In some cases, you may need to use a landline telephone instead of a cell phone. What are the risks? Generally, this device is safe to use, but it possible that the skin under the electrodes will become irritated. How to use your cardiac event monitor Wear your monitor at all times, except when you are in water: Do not let the monitor get wet. Take the monitor off when you bathe. Do not swim or use a hot tub with it on. Keep your skin clean. Do not put body lotion or moisturizer on your chest. Change the electrodes as told by your health care provider or any time they stop sticking to your skin.  You may need to use medical tape to keep them on. Try to put the electrodes in slightly different places on your chest to help prevent skin irritation. They must remain in the area under your left breast and in the upper right section of your chest. Make sure the monitor is safely  clipped to your clothing or in a location close to your body that your health care provider recommends. Press the button to record as soon as you feel heart-related symptoms, such as: Dizziness. Weakness. Light-headedness. Palpitations. Thumping or pounding in your chest. Shortness of breath. Unexplained weakness. Keep a diary of your activities, such as walking, doing chores, and taking medicine. It is very important to note what you were doing when you pushed the button to record your symptoms. This will help your health care provider determine what might be contributing to your symptoms. Send the recorded information as recommended by your health care provider. It may take some time for your health care provider to process the results. Change the batteries as told by your health care provider. Keep electronic devices away from your monitor. This includes: Tablets. MP3 players. Cell phones. While wearing your monitor you should avoid: Electric blankets. Armed forces operational officer. Electric toothbrushes. Microwave ovens. Magnets. Metal detectors. Get help right away if: You have chest pain. You have extreme difficulty breathing or shortness of breath. You develop a very fast heartbeat that persists. You develop dizziness that does not go away. You faint or constantly feel like you are about to faint. Summary A cardiac event monitor is a small recording device that is used to help detect abnormal heart rhythms (arrhythmias). The monitor is used to record your heart rhythm when you have heart-related symptoms. Make sure you understand how to send the information from the monitor to your health care provider. It is important to press the button on the monitor when you have any heart-related symptoms. Keep a diary of your activities, such as walking, doing chores, and taking medicine. It is very important to note what you were doing when you pushed the button to record your symptoms. This will  help your health care provider learn what might be causing your symptoms. This information is not intended to replace advice given to you by your health care provider. Make sure you discuss any questions you have with your health care provider. Document Released: 01/06/2008 Document Revised: 03/13/2016 Document Reviewed: 03/13/2016 Elsevier Interactive Patient Education  2017 [place] have had with anesthetic medicines.  Any blood disorders you have.  Any surgeries you have had.  Any medical conditions you have.  Whether you are pregnant or may be pregnant. What happens before the procedure? No special preparation is needed. Eat and drink normally. What happens during the procedure?  In order to produce an image of your heart, gel will be applied to your chest and a wand-like tool (transducer) will be moved over your chest. The gel will help transmit the sound waves from the transducer. The sound waves will harmlessly bounce off your heart to allow the heart images to be captured in real-time motion. These images will then be recorded.  You may need an IV to receive a medicine that improves the quality of the pictures. What happens after the procedure? You may return to your normal schedule including diet, activities, and medicines, unless your health care provider tells you otherwise. This information is not intended to replace advice given to you by your health care provider. Make sure  you discuss any questions you have with your health care provider. Document Released: 03/26/2000 Document Revised: 11/15/2015 Document Reviewed: 12/04/2012 Elsevier Interactive Patient Education  2017 [employer].    If you need a refill on your cardiac medications before your next appointment, please call your pharmacy. ]

## 2016-09-16 ENCOUNTER — Ambulatory Visit (INDEPENDENT_AMBULATORY_CARE_PROVIDER_SITE_OTHER): Payer: PPO

## 2016-09-16 ENCOUNTER — Other Ambulatory Visit: Payer: PPO

## 2016-09-16 DIAGNOSIS — I493 Ventricular premature depolarization: Secondary | ICD-10-CM

## 2016-09-16 DIAGNOSIS — I5181 Takotsubo syndrome: Secondary | ICD-10-CM | POA: Diagnosis not present

## 2016-09-16 DIAGNOSIS — R5383 Other fatigue: Secondary | ICD-10-CM | POA: Diagnosis not present

## 2016-09-16 DIAGNOSIS — R002 Palpitations: Secondary | ICD-10-CM

## 2016-09-16 LAB — BASIC METABOLIC PANEL
BUN / CREAT RATIO: 16 (ref 12–28)
BUN: 17 mg/dL (ref 8–27)
CALCIUM: 9.6 mg/dL (ref 8.7–10.3)
CO2: 22 mmol/L (ref 18–29)
CREATININE: 1.04 mg/dL — AB (ref 0.57–1.00)
Chloride: 103 mmol/L (ref 96–106)
GFR calc non Af Amer: 53 mL/min/{1.73_m2} — ABNORMAL LOW (ref >59)
GFR, EST AFRICAN AMERICAN: 62 mL/min/{1.73_m2} (ref >59)
Glucose: 129 mg/dL — ABNORMAL HIGH (ref 65–99)
Potassium: 4.2 mmol/L (ref 3.5–5.2)
Sodium: 141 mmol/L (ref 134–144)

## 2016-09-16 LAB — CBC
HEMATOCRIT: 40.2 % (ref 34.0–46.6)
Hemoglobin: 13.1 g/dL (ref 11.1–15.9)
MCH: 29 pg (ref 26.6–33.0)
MCHC: 32.6 g/dL (ref 31.5–35.7)
MCV: 89 fL (ref 79–97)
Platelets: 324 10*3/uL (ref 150–379)
RBC: 4.52 x10E6/uL (ref 3.77–5.28)
RDW: 13.4 % (ref 12.3–15.4)
WBC: 7.3 10*3/uL (ref 3.4–10.8)

## 2016-09-16 LAB — TSH: TSH: 2.82 u[IU]/mL (ref 0.450–4.500)

## 2016-09-16 LAB — PRO B NATRIURETIC PEPTIDE: NT-PRO BNP: 4581 pg/mL — AB (ref 0–301)

## 2016-09-17 ENCOUNTER — Other Ambulatory Visit: Payer: Self-pay | Admitting: *Deleted

## 2016-09-17 DIAGNOSIS — I10 Essential (primary) hypertension: Secondary | ICD-10-CM

## 2016-09-17 MED ORDER — POTASSIUM CHLORIDE ER 10 MEQ PO TBCR: 10.0000 meq | EXTENDED_RELEASE_TABLET | Freq: Every day | ORAL | 9 refills | Status: DC

## 2016-09-17 MED ORDER — FUROSEMIDE 20 MG PO TABS: 20.0000 mg | ORAL_TABLET | Freq: Every day | ORAL | 9 refills | Status: DC

## 2016-09-27 ENCOUNTER — Other Ambulatory Visit: Payer: PPO | Admitting: *Deleted

## 2016-09-27 ENCOUNTER — Other Ambulatory Visit (HOSPITAL_COMMUNITY): Payer: PPO

## 2016-09-27 DIAGNOSIS — I10 Essential (primary) hypertension: Secondary | ICD-10-CM | POA: Diagnosis not present

## 2016-09-27 LAB — BASIC METABOLIC PANEL
BUN / CREAT RATIO: 18 (ref 12–28)
BUN: 21 mg/dL (ref 8–27)
CHLORIDE: 102 mmol/L (ref 96–106)
CO2: 23 mmol/L (ref 20–29)
Calcium: 9.6 mg/dL (ref 8.7–10.3)
Creatinine, Ser: 1.15 mg/dL — ABNORMAL HIGH (ref 0.57–1.00)
GFR calc Af Amer: 55 mL/min/{1.73_m2} — ABNORMAL LOW (ref >59)
GFR calc non Af Amer: 47 mL/min/{1.73_m2} — ABNORMAL LOW (ref >59)
GLUCOSE: 55 mg/dL — AB (ref 65–99)
POTASSIUM: 4.9 mmol/L (ref 3.5–5.2)
SODIUM: 142 mmol/L (ref 134–144)

## 2016-09-28 ENCOUNTER — Telehealth: Payer: Self-pay | Admitting: Physician Assistant

## 2016-09-28 NOTE — Telephone Encounter (Signed)
New message ° ° ° °Pt is returning call about lab results. °

## 2016-09-28 NOTE — Telephone Encounter (Signed)
-----   Message from [name], PA-C sent at 09/28/2016 10:03 AM EDT ----- Labs look good. How is she feeling?

## 2016-09-28 NOTE — Telephone Encounter (Signed)
Returned pts call. See result note for lab.

## 2016-11-01 DIAGNOSIS — E785 Hyperlipidemia, unspecified: Secondary | ICD-10-CM | POA: Diagnosis not present

## 2016-11-01 DIAGNOSIS — I10 Essential (primary) hypertension: Secondary | ICD-10-CM | POA: Diagnosis not present

## 2016-11-01 DIAGNOSIS — F411 Generalized anxiety disorder: Secondary | ICD-10-CM | POA: Diagnosis not present

## 2016-11-01 DIAGNOSIS — E669 Obesity, unspecified: Secondary | ICD-10-CM | POA: Diagnosis not present

## 2016-11-03 DIAGNOSIS — E781 Pure hyperglyceridemia: Secondary | ICD-10-CM | POA: Diagnosis not present

## 2016-11-03 DIAGNOSIS — I10 Essential (primary) hypertension: Secondary | ICD-10-CM | POA: Diagnosis not present

## 2016-11-03 DIAGNOSIS — E785 Hyperlipidemia, unspecified: Secondary | ICD-10-CM | POA: Diagnosis not present

## 2016-11-22 ENCOUNTER — Encounter (INDEPENDENT_AMBULATORY_CARE_PROVIDER_SITE_OTHER): Payer: Self-pay

## 2016-11-22 ENCOUNTER — Ambulatory Visit (HOSPITAL_COMMUNITY): Payer: PPO | Attending: Cardiovascular Disease

## 2016-11-22 ENCOUNTER — Other Ambulatory Visit: Payer: Self-pay

## 2016-11-22 DIAGNOSIS — I517 Cardiomegaly: Secondary | ICD-10-CM | POA: Insufficient documentation

## 2016-11-22 DIAGNOSIS — I5181 Takotsubo syndrome: Secondary | ICD-10-CM | POA: Diagnosis not present

## 2016-11-22 MED ORDER — PERFLUTREN LIPID MICROSPHERE
1.0000 mL | INTRAVENOUS | Status: AC | PRN
  Administered 2016-11-22: 2 mL via INTRAVENOUS

## 2016-11-23 ENCOUNTER — Telehealth: Payer: Self-pay | Admitting: *Deleted

## 2016-11-23 NOTE — Telephone Encounter (Signed)
Patient informed. 

## 2016-11-23 NOTE — Telephone Encounter (Signed)
-----   Message from [name], [place] sent at 11/23/2016 10:36 AM EDT ----- Aimee Hood is now much improved to 45-50% with some mild hypokinesis of apical myocardium and grade 2 diastolic dysfunction (moderate stiffness of the heart.) Continue medical therapy.

## 2017-01-19 ENCOUNTER — Other Ambulatory Visit: Payer: Self-pay

## 2017-01-19 DIAGNOSIS — I10 Essential (primary) hypertension: Secondary | ICD-10-CM

## 2017-01-19 MED ORDER — METOPROLOL SUCCINATE ER 50 MG PO TB24: 50.0000 mg | ORAL_TABLET | Freq: Every day | ORAL | 11 refills | Status: DC

## 2017-02-01 DIAGNOSIS — F411 Generalized anxiety disorder: Secondary | ICD-10-CM | POA: Diagnosis not present

## 2017-02-01 DIAGNOSIS — Z23 Encounter for immunization: Secondary | ICD-10-CM | POA: Diagnosis not present

## 2017-02-01 DIAGNOSIS — E669 Obesity, unspecified: Secondary | ICD-10-CM | POA: Diagnosis not present

## 2017-02-01 DIAGNOSIS — I10 Essential (primary) hypertension: Secondary | ICD-10-CM | POA: Diagnosis not present

## 2017-02-16 DIAGNOSIS — H35422 Microcystoid degeneration of retina, left eye: Secondary | ICD-10-CM | POA: Diagnosis not present

## 2017-02-16 DIAGNOSIS — H43813 Vitreous degeneration, bilateral: Secondary | ICD-10-CM | POA: Diagnosis not present

## 2017-02-16 DIAGNOSIS — H353132 Nonexudative age-related macular degeneration, bilateral, intermediate dry stage: Secondary | ICD-10-CM | POA: Diagnosis not present

## 2017-02-22 DIAGNOSIS — L570 Actinic keratosis: Secondary | ICD-10-CM | POA: Diagnosis not present

## 2017-02-22 DIAGNOSIS — L218 Other seborrheic dermatitis: Secondary | ICD-10-CM | POA: Diagnosis not present

## 2017-02-28 DIAGNOSIS — R300 Dysuria: Secondary | ICD-10-CM | POA: Diagnosis not present

## 2017-03-09 DIAGNOSIS — H353131 Nonexudative age-related macular degeneration, bilateral, early dry stage: Secondary | ICD-10-CM | POA: Diagnosis not present

## 2017-03-17 DIAGNOSIS — Z1231 Encounter for screening mammogram for malignant neoplasm of breast: Secondary | ICD-10-CM | POA: Diagnosis not present

## 2017-04-26 DIAGNOSIS — L570 Actinic keratosis: Secondary | ICD-10-CM | POA: Diagnosis not present

## 2017-04-26 DIAGNOSIS — L720 Epidermal cyst: Secondary | ICD-10-CM | POA: Diagnosis not present

## 2017-04-26 DIAGNOSIS — L218 Other seborrheic dermatitis: Secondary | ICD-10-CM | POA: Diagnosis not present

## 2017-04-26 DIAGNOSIS — L820 Inflamed seborrheic keratosis: Secondary | ICD-10-CM | POA: Diagnosis not present

## 2017-06-06 DIAGNOSIS — E669 Obesity, unspecified: Secondary | ICD-10-CM | POA: Diagnosis not present

## 2017-06-06 DIAGNOSIS — I10 Essential (primary) hypertension: Secondary | ICD-10-CM | POA: Diagnosis not present

## 2017-06-06 DIAGNOSIS — F411 Generalized anxiety disorder: Secondary | ICD-10-CM | POA: Diagnosis not present

## 2017-06-20 ENCOUNTER — Ambulatory Visit (INDEPENDENT_AMBULATORY_CARE_PROVIDER_SITE_OTHER): Payer: PPO

## 2017-06-20 ENCOUNTER — Encounter (INDEPENDENT_AMBULATORY_CARE_PROVIDER_SITE_OTHER): Payer: Self-pay | Admitting: Orthopaedic Surgery

## 2017-06-20 ENCOUNTER — Ambulatory Visit (INDEPENDENT_AMBULATORY_CARE_PROVIDER_SITE_OTHER): Payer: PPO | Admitting: Orthopaedic Surgery

## 2017-06-20 VITALS — BP 125/73 | HR 67 | Ht 65.0 in | Wt 235.0 lb

## 2017-06-20 DIAGNOSIS — M25561 Pain in right knee: Secondary | ICD-10-CM

## 2017-06-20 DIAGNOSIS — M1711 Unilateral primary osteoarthritis, right knee: Secondary | ICD-10-CM

## 2017-06-20 MED ORDER — METHYLPREDNISOLONE ACETATE 40 MG/ML IJ SUSP
80.0000 mg | INTRAMUSCULAR | Status: AC | PRN
  Administered 2017-06-20: 80 mg

## 2017-06-20 MED ORDER — LIDOCAINE HCL 1 % IJ SOLN
2.0000 mL | INTRAMUSCULAR | Status: AC | PRN
  Administered 2017-06-20: 2 mL

## 2017-06-20 MED ORDER — BUPIVACAINE HCL 0.5 % IJ SOLN
2.0000 mL | INTRAMUSCULAR | Status: AC | PRN
  Administered 2017-06-20: 2 mL via INTRA_ARTICULAR

## 2017-06-20 NOTE — Progress Notes (Signed)
Office Visit Note   Patient: Aimee Hood           Date of Birth: June 10, 1940           MRN: [id number] Visit Date: 06/20/2017              Requested by: Kristopher Glee., MD [address] [place], [place] [address] PCP: Kristopher Glee., MD   Assessment & Plan: Visit Diagnoses:  1. Acute pain of right knee   2. Unilateral primary osteoarthritis, right knee     Plan: Primary osteoarthritis right knee.  Long discussion regarding diagnosis.  We will start with a cortisone injection and monitor response  Follow-Up Instructions: Return if symptoms worsen or fail to improve.   Orders:  Orders Placed This Encounter  Procedures  . Large Joint Inj: R knee  . XR KNEE 3 VIEW RIGHT   No orders of the defined types were placed in this encounter.     Procedures: Large Joint Inj: R knee on 06/20/2017 3:01 PM Indications: pain and diagnostic evaluation Details: 25 G 1.5 in needle, anteromedial approach  Arthrogram: No  Medications: 2 mL lidocaine 1 %; 2 mL bupivacaine 0.5 %; 80 mg methylPREDNISolone acetate 40 MG/ML Procedure, treatment alternatives, risks and benefits explained, specific risks discussed. Consent was given by the patient. Immediately prior to procedure a time out was called to verify the correct patient, procedure, equipment, support staff and site/side marked as required. Patient was prepped and draped in the usual sterile fashion.       Clinical Data: No additional findings.   Subjective: Chief Complaint  Patient presents with  . Right Knee - Pain    Aimee Hood is 77 y o f here for right knee pain, no injury, no injections or xrays.  Aggressive pain in right knee over a period of many months.  Denies any history of injury or trauma.  Some occasional swelling associated with stiffness.  Tried over-the-counter medicines with some relief.  Prior history of left total knee reverse placement with good result and prior right knee arthroscopy there is ago  with no proximal pain.  No referred pain to  HPI  Review of Systems  Constitutional: Positive for fatigue and fever.  HENT: Negative for ear pain.   Eyes: Positive for pain.  Respiratory: Negative for cough and shortness of breath.   Cardiovascular: Positive for leg swelling.  Gastrointestinal: Negative for blood in stool, constipation and diarrhea.  Genitourinary: Negative for dysuria.  Musculoskeletal: Negative for back pain and neck pain.  Skin: Negative for rash and wound.  Allergic/Immunologic: Negative for food allergies.  Neurological: Negative for dizziness, weakness, light-headedness, numbness and headaches.  Hematological: Does not bruise/bleed easily.  Psychiatric/Behavioral: Positive for sleep disturbance.     Objective: Vital Signs: BP 125/73 (BP Location: Left Arm, Patient Position: Sitting, Cuff Size: Normal)   Pulse 67   Ht 5\' 5"  (1.651 m)   Wt 235 lb (106.6 kg)   BMI 39.11 kg/m   Physical Exam  Ortho Exam awake alert and oriented x3.  Comfortable sitting left knee exam without evidence of effusion or instability.  Full extension flexion about 100-3 degrees with a goniometer.  Vascular exam intact.  No distal edema. Right knee without effusion.  Large knees.  Probably medial joint pain.  Increased varus with weightbearing.  No instability.  Full extension but 100 degrees of flexion and straight leg raise negative.  Painless range of motion of both hips  Specialty Comments:  No specialty comments available.  Imaging: Xr Knee 3 View Right  Result Date: 06/20/2017 Terms of the right knee were obtained in 3 projections standing.  Tricompartmental degenerative changes are identified.  There is about 4 degrees of varus compartment.  There are peripheral osteophytes and subchondral sclerosis on both sides of the joint and at the patellofemoral joint.  No ectopic calcification.  Changes are consistent with advanced osteoarthritis    PMFS History: Patient Active  Problem List   Diagnosis Date Noted  . 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 NSTEMI (non-ST elevated myocardial infarction) ([place])   . Chest pain with moderate risk of acute coronary syndrome 08/31/2016  . Normal coronary arteries-2016 08/31/2016  . Chest pain on exertion 06/11/2014  . Osteoarthritis of left knee 07/12/2013  . S/P total knee replacement using cement 07/10/2013  . Aneurysm, cerebral, nonruptured 04/23/2013  . History of LMCA CVA-TPA 2014 03/09/2013  . Osteoarthritis 11/14/2012  . Restless legs syndrome 11/14/2012  . GERD (gastroesophageal reflux disease) 11/13/2012  . Essential hypertension 11/05/2012  . Other and unspecified hyperlipidemia 11/05/2012   Past Medical History:  Diagnosis Date  . Allergy   . Arthritis    "all over" (07/10/2013)  . CAD (coronary artery disease)    a. normal cors by cath in 2016  b. 08/2016: admitted with an NSTEMI. Cath showed mild nonobstructive CAD with only 20% mid-LAD stenosis. EF was reduced to 35% and wall motion abnormalities were suggestive of a stress-induced cardiomyopathy.  . Carpal tunnel syndrome of left wrist   . Cerebral aneurysm, nonruptured    3-4 mm in size, stable since 2004 (most recent MRI 2014)  . Colon polyp 2007  . Diverticulitis   . Family history of anesthesia complication    "brother had PONV"  . Gastroesophageal reflux disease   . High cholesterol   . Hypertension   . Insomnia   . Macular degeneration 08/31/2016  . PONV (postoperative nausea and vomiting)   . Restless leg syndrome   . Stroke [facility]) 02/2013   denies residual on 07/10/2013)    Family History  Problem Relation Age of Onset  . Heart disease Brother 32  . Cancer Brother        lung  . Cancer Father        lung  . Hypertension Father   . Diabetes Paternal Aunt   . Cancer Maternal Grandmother   . Stroke Neg Hx     Past Surgical History:  Procedure Laterality Date  . BREAST SURGERY    . CHOLECYSTECTOMY    . JOINT REPLACEMENT    .  KNEE ARTHROSCOPY Bilateral   . LEFT HEART CATH AND CORONARY ANGIOGRAPHY N/A 09/01/2016   Procedure: Left Heart Cath and Coronary Angiography;  Surgeon: Wellington Hampshire, MD;  Location: [facility] CV LAB;  Service: Cardiovascular;  Laterality: N/A;  . LEFT HEART CATHETERIZATION WITH CORONARY ANGIOGRAM N/A 06/20/2014   Procedure: LEFT HEART CATHETERIZATION WITH CORONARY ANGIOGRAM;  Surgeon: [name], MD;  Location: [facility] CATH LAB;  Service: Cardiovascular;  Laterality: N/A;  . TEE WITHOUT CARDIOVERSION N/A 03/14/2013   Procedure: TRANSESOPHAGEAL ECHOCARDIOGRAM (TEE);  Surgeon: Candee Furbish, MD;  Location: [facility] ENDOSCOPY;  Service: Cardiovascular;  Laterality: N/A;  . TOTAL KNEE ARTHROPLASTY Left 07/10/2013  . TOTAL KNEE ARTHROPLASTY Left 07/10/2013   Procedure: TOTAL KNEE ARTHROPLASTY;  Surgeon: Garald Balding, MD;  Location: [facility];  Service: Orthopedics;  Laterality: Left;  Marland Kitchen VAGINAL HYSTERECTOMY  2000's   Social History  Occupational History  . Occupation: hairdresser    Employer: [employer]  Tobacco Use  . Smoking status: Never Smoker  . Smokeless tobacco: Never Used  Substance and Sexual Activity  . Alcohol use: No  . Drug use: No  . Sexual activity: Not Currently

## 2017-07-12 ENCOUNTER — Other Ambulatory Visit: Payer: Self-pay | Admitting: Physician Assistant

## 2017-09-06 ENCOUNTER — Other Ambulatory Visit: Payer: Self-pay | Admitting: Physician Assistant

## 2017-09-28 ENCOUNTER — Other Ambulatory Visit: Payer: Self-pay | Admitting: Physician Assistant

## 2017-09-28 NOTE — Telephone Encounter (Signed)
Pt's pharmacy is requesting a refill on clonazepam. Dr. Marlou Porch is pt's primary cardiologist. Would Dr. Marlou Porch like to refill this medication? Please address

## 2017-10-25 DIAGNOSIS — L218 Other seborrheic dermatitis: Secondary | ICD-10-CM | POA: Diagnosis not present

## 2017-10-25 DIAGNOSIS — L821 Other seborrheic keratosis: Secondary | ICD-10-CM | POA: Diagnosis not present

## 2017-10-25 DIAGNOSIS — D225 Melanocytic nevi of trunk: Secondary | ICD-10-CM | POA: Diagnosis not present

## 2017-10-31 ENCOUNTER — Other Ambulatory Visit: Payer: Self-pay | Admitting: Physician Assistant

## 2017-11-09 ENCOUNTER — Other Ambulatory Visit: Payer: Self-pay | Admitting: Student

## 2017-11-19 ENCOUNTER — Other Ambulatory Visit: Payer: Self-pay | Admitting: Physician Assistant

## 2017-12-04 ENCOUNTER — Other Ambulatory Visit: Payer: Self-pay | Admitting: Cardiology

## 2017-12-05 DIAGNOSIS — Z23 Encounter for immunization: Secondary | ICD-10-CM | POA: Diagnosis not present

## 2017-12-05 DIAGNOSIS — F411 Generalized anxiety disorder: Secondary | ICD-10-CM | POA: Diagnosis not present

## 2017-12-05 DIAGNOSIS — I10 Essential (primary) hypertension: Secondary | ICD-10-CM | POA: Diagnosis not present

## 2017-12-05 DIAGNOSIS — E785 Hyperlipidemia, unspecified: Secondary | ICD-10-CM | POA: Diagnosis not present

## 2017-12-05 DIAGNOSIS — E781 Pure hyperglyceridemia: Secondary | ICD-10-CM | POA: Diagnosis not present

## 2017-12-05 DIAGNOSIS — E669 Obesity, unspecified: Secondary | ICD-10-CM | POA: Diagnosis not present

## 2017-12-05 DIAGNOSIS — Z13820 Encounter for screening for osteoporosis: Secondary | ICD-10-CM | POA: Diagnosis not present

## 2017-12-06 DIAGNOSIS — E785 Hyperlipidemia, unspecified: Secondary | ICD-10-CM | POA: Diagnosis not present

## 2017-12-06 DIAGNOSIS — E781 Pure hyperglyceridemia: Secondary | ICD-10-CM | POA: Diagnosis not present

## 2017-12-06 DIAGNOSIS — I10 Essential (primary) hypertension: Secondary | ICD-10-CM | POA: Diagnosis not present

## 2017-12-07 DIAGNOSIS — R8290 Unspecified abnormal findings in urine: Secondary | ICD-10-CM | POA: Diagnosis not present

## 2017-12-09 ENCOUNTER — Other Ambulatory Visit: Payer: Self-pay | Admitting: Cardiology

## 2017-12-09 NOTE — Telephone Encounter (Signed)
Order Providers   Prescribing Provider Encounter Provider  [name], MD [name], PA-C  Outpatient Medication Detail    Disp Refills Start End   potassium chloride (K-DUR) 10 MEQ tablet 15 tablet 0 11/21/2017    Sig: TAKE 1 TAB BY MOUTH DAILY. PLEASE MAKE OVERDUE APPT WITH DR. Marlou Porch BEFORE ANYMORE REFILLS.   Sent to pharmacy as: potassium chloride (K-DUR) 10 MEQ tablet   Notes to Pharmacy: Please call our office to schedule an overdue office visit for any future refills. [phone]. Thank you. 3rd and final attempt.   E-Prescribing Status: Receipt confirmed by pharmacy (11/21/2017 1:49 PM EDT)   Pharmacy   [id number] - [place], [place] - [address]

## 2017-12-17 ENCOUNTER — Other Ambulatory Visit: Payer: Self-pay | Admitting: Physician Assistant

## 2017-12-17 ENCOUNTER — Other Ambulatory Visit: Payer: Self-pay | Admitting: Cardiology

## 2017-12-19 NOTE — Telephone Encounter (Signed)
Pt has been giving multiple refills with notes to schedule but has fail to do so. Please advise

## 2017-12-26 DIAGNOSIS — Z780 Asymptomatic menopausal state: Secondary | ICD-10-CM | POA: Diagnosis not present

## 2017-12-26 DIAGNOSIS — M8588 Other specified disorders of bone density and structure, other site: Secondary | ICD-10-CM | POA: Diagnosis not present

## 2017-12-26 DIAGNOSIS — Z13820 Encounter for screening for osteoporosis: Secondary | ICD-10-CM | POA: Diagnosis not present

## 2018-01-01 ENCOUNTER — Other Ambulatory Visit: Payer: Self-pay | Admitting: Cardiology

## 2018-01-01 ENCOUNTER — Other Ambulatory Visit: Payer: Self-pay | Admitting: Physician Assistant

## 2018-01-06 ENCOUNTER — Other Ambulatory Visit: Payer: Self-pay | Admitting: Cardiology

## 2018-01-16 ENCOUNTER — Other Ambulatory Visit: Payer: Self-pay | Admitting: Cardiology

## 2018-01-23 ENCOUNTER — Other Ambulatory Visit: Payer: Self-pay | Admitting: Physician Assistant

## 2018-01-27 ENCOUNTER — Other Ambulatory Visit: Payer: Self-pay | Admitting: Cardiology

## 2018-02-07 ENCOUNTER — Other Ambulatory Visit: Payer: Self-pay | Admitting: Student

## 2018-02-07 DIAGNOSIS — I10 Essential (primary) hypertension: Secondary | ICD-10-CM

## 2018-02-13 DIAGNOSIS — H01116 Allergic dermatitis of left eye, unspecified eyelid: Secondary | ICD-10-CM | POA: Diagnosis not present

## 2018-02-22 DIAGNOSIS — H43392 Other vitreous opacities, left eye: Secondary | ICD-10-CM | POA: Diagnosis not present

## 2018-02-22 DIAGNOSIS — H35423 Microcystoid degeneration of retina, bilateral: Secondary | ICD-10-CM | POA: Diagnosis not present

## 2018-02-22 DIAGNOSIS — H353132 Nonexudative age-related macular degeneration, bilateral, intermediate dry stage: Secondary | ICD-10-CM | POA: Diagnosis not present

## 2018-02-22 DIAGNOSIS — H43813 Vitreous degeneration, bilateral: Secondary | ICD-10-CM | POA: Diagnosis not present

## 2018-03-02 ENCOUNTER — Other Ambulatory Visit: Payer: Self-pay | Admitting: Cardiology

## 2018-03-02 DIAGNOSIS — I10 Essential (primary) hypertension: Secondary | ICD-10-CM

## 2018-03-07 DIAGNOSIS — R05 Cough: Secondary | ICD-10-CM | POA: Diagnosis not present

## 2018-03-07 DIAGNOSIS — I10 Essential (primary) hypertension: Secondary | ICD-10-CM | POA: Diagnosis not present

## 2018-03-07 DIAGNOSIS — J018 Other acute sinusitis: Secondary | ICD-10-CM | POA: Diagnosis not present

## 2018-03-14 DIAGNOSIS — H353131 Nonexudative age-related macular degeneration, bilateral, early dry stage: Secondary | ICD-10-CM | POA: Diagnosis not present

## 2018-03-22 DIAGNOSIS — Z1231 Encounter for screening mammogram for malignant neoplasm of breast: Secondary | ICD-10-CM | POA: Diagnosis not present

## 2018-03-22 DIAGNOSIS — J0140 Acute pansinusitis, unspecified: Secondary | ICD-10-CM | POA: Diagnosis not present

## 2018-03-22 DIAGNOSIS — Z1239 Encounter for other screening for malignant neoplasm of breast: Secondary | ICD-10-CM | POA: Diagnosis not present

## 2018-04-01 ENCOUNTER — Other Ambulatory Visit: Payer: Self-pay | Admitting: Cardiology

## 2018-04-01 DIAGNOSIS — I10 Essential (primary) hypertension: Secondary | ICD-10-CM

## 2018-06-05 DIAGNOSIS — E559 Vitamin D deficiency, unspecified: Secondary | ICD-10-CM | POA: Diagnosis not present

## 2018-06-05 DIAGNOSIS — R413 Other amnesia: Secondary | ICD-10-CM | POA: Diagnosis not present

## 2018-06-05 DIAGNOSIS — N183 Chronic kidney disease, stage 3 (moderate): Secondary | ICD-10-CM | POA: Diagnosis not present

## 2018-06-05 DIAGNOSIS — E785 Hyperlipidemia, unspecified: Secondary | ICD-10-CM | POA: Diagnosis not present

## 2018-06-05 DIAGNOSIS — F411 Generalized anxiety disorder: Secondary | ICD-10-CM | POA: Diagnosis not present

## 2018-06-05 DIAGNOSIS — E669 Obesity, unspecified: Secondary | ICD-10-CM | POA: Diagnosis not present

## 2018-06-05 DIAGNOSIS — E781 Pure hyperglyceridemia: Secondary | ICD-10-CM | POA: Diagnosis not present

## 2018-06-05 DIAGNOSIS — I129 Hypertensive chronic kidney disease with stage 1 through stage 4 chronic kidney disease, or unspecified chronic kidney disease: Secondary | ICD-10-CM | POA: Diagnosis not present

## 2018-06-05 DIAGNOSIS — K219 Gastro-esophageal reflux disease without esophagitis: Secondary | ICD-10-CM | POA: Diagnosis not present

## 2018-06-05 DIAGNOSIS — E538 Deficiency of other specified B group vitamins: Secondary | ICD-10-CM | POA: Diagnosis not present

## 2018-06-06 DIAGNOSIS — E538 Deficiency of other specified B group vitamins: Secondary | ICD-10-CM | POA: Diagnosis not present

## 2018-06-07 DIAGNOSIS — E538 Deficiency of other specified B group vitamins: Secondary | ICD-10-CM | POA: Diagnosis not present

## 2018-06-08 DIAGNOSIS — E538 Deficiency of other specified B group vitamins: Secondary | ICD-10-CM | POA: Diagnosis not present

## 2018-06-09 DIAGNOSIS — E538 Deficiency of other specified B group vitamins: Secondary | ICD-10-CM | POA: Diagnosis not present

## 2018-06-10 DIAGNOSIS — R413 Other amnesia: Secondary | ICD-10-CM | POA: Diagnosis not present

## 2018-06-10 DIAGNOSIS — E538 Deficiency of other specified B group vitamins: Secondary | ICD-10-CM | POA: Diagnosis not present

## 2018-06-11 DIAGNOSIS — E538 Deficiency of other specified B group vitamins: Secondary | ICD-10-CM | POA: Diagnosis not present

## 2018-06-14 DIAGNOSIS — E538 Deficiency of other specified B group vitamins: Secondary | ICD-10-CM | POA: Diagnosis not present

## 2018-06-19 DIAGNOSIS — E538 Deficiency of other specified B group vitamins: Secondary | ICD-10-CM | POA: Diagnosis not present

## 2018-06-27 DIAGNOSIS — E538 Deficiency of other specified B group vitamins: Secondary | ICD-10-CM | POA: Diagnosis not present

## 2018-08-16 DIAGNOSIS — I729 Aneurysm of unspecified site: Secondary | ICD-10-CM | POA: Diagnosis not present

## 2018-08-16 DIAGNOSIS — R413 Other amnesia: Secondary | ICD-10-CM | POA: Diagnosis not present

## 2018-08-23 DIAGNOSIS — H353132 Nonexudative age-related macular degeneration, bilateral, intermediate dry stage: Secondary | ICD-10-CM | POA: Diagnosis not present

## 2018-08-30 DIAGNOSIS — I671 Cerebral aneurysm, nonruptured: Secondary | ICD-10-CM | POA: Diagnosis not present

## 2018-08-30 DIAGNOSIS — I729 Aneurysm of unspecified site: Secondary | ICD-10-CM | POA: Diagnosis not present

## 2018-09-11 DIAGNOSIS — F411 Generalized anxiety disorder: Secondary | ICD-10-CM | POA: Diagnosis not present

## 2018-09-11 DIAGNOSIS — R200 Anesthesia of skin: Secondary | ICD-10-CM | POA: Diagnosis not present

## 2018-09-11 DIAGNOSIS — E781 Pure hyperglyceridemia: Secondary | ICD-10-CM | POA: Diagnosis not present

## 2018-09-11 DIAGNOSIS — E785 Hyperlipidemia, unspecified: Secondary | ICD-10-CM | POA: Diagnosis not present

## 2018-09-11 DIAGNOSIS — E538 Deficiency of other specified B group vitamins: Secondary | ICD-10-CM | POA: Diagnosis not present

## 2018-09-11 DIAGNOSIS — I129 Hypertensive chronic kidney disease with stage 1 through stage 4 chronic kidney disease, or unspecified chronic kidney disease: Secondary | ICD-10-CM | POA: Diagnosis not present

## 2018-09-11 DIAGNOSIS — N183 Chronic kidney disease, stage 3 (moderate): Secondary | ICD-10-CM | POA: Diagnosis not present

## 2018-09-11 DIAGNOSIS — E559 Vitamin D deficiency, unspecified: Secondary | ICD-10-CM | POA: Diagnosis not present

## 2018-09-11 DIAGNOSIS — K219 Gastro-esophageal reflux disease without esophagitis: Secondary | ICD-10-CM | POA: Diagnosis not present

## 2018-09-11 DIAGNOSIS — E669 Obesity, unspecified: Secondary | ICD-10-CM | POA: Diagnosis not present

## 2018-10-06 DIAGNOSIS — S79911A Unspecified injury of right hip, initial encounter: Secondary | ICD-10-CM | POA: Diagnosis not present

## 2018-10-06 DIAGNOSIS — M25561 Pain in right knee: Secondary | ICD-10-CM | POA: Diagnosis not present

## 2018-10-06 DIAGNOSIS — R42 Dizziness and giddiness: Secondary | ICD-10-CM | POA: Diagnosis not present

## 2018-10-06 DIAGNOSIS — M25551 Pain in right hip: Secondary | ICD-10-CM | POA: Diagnosis not present

## 2018-12-05 ENCOUNTER — Encounter (INDEPENDENT_AMBULATORY_CARE_PROVIDER_SITE_OTHER): Payer: Self-pay

## 2018-12-05 ENCOUNTER — Other Ambulatory Visit: Payer: Self-pay

## 2018-12-05 ENCOUNTER — Ambulatory Visit (INDEPENDENT_AMBULATORY_CARE_PROVIDER_SITE_OTHER): Payer: PPO | Admitting: Orthopaedic Surgery

## 2018-12-05 ENCOUNTER — Encounter: Payer: Self-pay | Admitting: Orthopaedic Surgery

## 2018-12-05 VITALS — BP 121/89 | HR 85 | Resp 16 | Ht 65.5 in | Wt 230.0 lb

## 2018-12-05 DIAGNOSIS — G8929 Other chronic pain: Secondary | ICD-10-CM | POA: Diagnosis not present

## 2018-12-05 DIAGNOSIS — R200 Anesthesia of skin: Secondary | ICD-10-CM

## 2018-12-05 DIAGNOSIS — M25561 Pain in right knee: Secondary | ICD-10-CM

## 2018-12-05 MED ORDER — METHYLPREDNISOLONE ACETATE 40 MG/ML IJ SUSP
80.0000 mg | INTRAMUSCULAR | Status: AC | PRN
  Administered 2018-12-05: 80 mg via INTRA_ARTICULAR

## 2018-12-05 MED ORDER — BUPIVACAINE HCL 0.5 % IJ SOLN
2.0000 mL | INTRAMUSCULAR | Status: AC | PRN
  Administered 2018-12-05: 2 mL via INTRA_ARTICULAR

## 2018-12-05 MED ORDER — LIDOCAINE HCL 1 % IJ SOLN
2.0000 mL | INTRAMUSCULAR | Status: AC | PRN
  Administered 2018-12-05: 14:00:00 2 mL

## 2018-12-05 NOTE — Progress Notes (Signed)
Office Visit Note   Patient: Aimee Hood           Date of Birth: [birth year]           MRN: [id number] Visit Date: 12/05/2018              Requested by: Kristopher Glee., MD [id number] [place],  [place] [address] PCP: Kristopher Glee., MD   Assessment & Plan: Visit Diagnoses:  1. Numbness of right hand   2. Chronic pain of right knee     Plan: Acute exacerbation of right knee pain after a fall about a month ago.  Has osteoarthritis of both knees.  Will inject the lateral compartment of the right knee as it is the more symptomatic area and monitor response.  We will also apply a spider brace.  Experiencing numbness and tingling into the index and long finger of the right hand.  Will obtain EMGs and nerve conduction studies  Follow-Up Instructions: Return in about 1 week (around 12/12/2018), or After EMGs and nerve conduction studies right hand.   Orders:  Orders Placed This Encounter  Procedures  . Large Joint Inj: R knee  . Ambulatory referral to Physical Medicine Rehab   No orders of the defined types were placed in this encounter.     Procedures: Large Joint Inj: R knee on 12/05/2018 1:52 PM Indications: pain and diagnostic evaluation Details: 25 G 1.5 in needle, anterolateral approach  Arthrogram: No  Medications: 2 mL lidocaine 1 %; 2 mL bupivacaine 0.5 %; 80 mg methylPREDNISolone acetate 40 MG/ML Procedure, treatment alternatives, risks and benefits explained, specific risks discussed. Consent was given by the patient. Immediately prior to procedure a time out was called to verify the correct patient, procedure, equipment, support staff and site/side marked as required. Patient was prepped and draped in the usual sterile fashion.       Clinical Data: No additional findings.   Subjective: Chief Complaint  Patient presents with  . Right Leg - Injury  . Right Hand - Numbness   Aimee Hood is a 78 year old female who presents with right leg pain due  to a fall x 1 month.  Has a history of vertigo with falls.  Had a cortisone injection over a year ago in the right knee with excellent relief.  Now having pain more laterally after her last fall.  Had films elsewhere that we could not retrieve but were interpreted as normal without evidence of an acute fracture  She is also having right hand numbness specifically into the index and long finger.  Having difficulty with multiple activities including sleep  HPI  Review of Systems   Objective: Vital Signs: BP 121/89 (BP Location: Right Arm, Patient Position: Sitting, Cuff Size: Normal)   Pulse 85   Resp 16   Ht 5' 5.5" (1.664 m)   Wt 230 lb (104.3 kg)   BMI 37.69 kg/m   Physical Exam Constitutional:      Appearance: She is well-developed.  Eyes:     Pupils: Pupils are equal, round, and reactive to light.  Pulmonary:     Effort: Pulmonary effort is normal.  Skin:    General: Skin is warm and dry.  Neurological:     Mental Status: She is alert and oriented to person, place, and time.  Psychiatric:        Behavior: Behavior normal.     Ortho Exam right knee was not hot red warm  or swollen.  Could have a very minimal effusion.  Mostly lateral joint pain none medially.  Some patellar crepitation and pain with compression.  No evidence of increased varus of valgus.  No calf pain.  Straight leg raise negative.  No hip pain.  Not having any complaints of back or hip pain. Positive Phalen's and Tinel's over the median nerve right wrist.  Does have some Dupuytren's changes in the ring finger but no functional compromise  Specialty Comments:  No specialty comments available.  Imaging: No results found.   PMFS History: Patient Active Problem List   Diagnosis Date Noted  . Pain in right knee 12/05/2018  . 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 NSTEMI (non-ST elevated myocardial infarction) ([place])   . Chest pain with moderate risk of acute coronary syndrome 08/31/2016  . Normal  coronary arteries-2016 08/31/2016  . Chest pain on exertion 06/11/2014  . Osteoarthritis of left knee 07/12/2013  . S/P total knee replacement using cement 07/10/2013  . Aneurysm, cerebral, nonruptured 04/23/2013  . History of LMCA CVA-TPA 2014 03/09/2013  . Osteoarthritis 11/14/2012  . Restless legs syndrome 11/14/2012  . GERD (gastroesophageal reflux disease) 11/13/2012  . Essential hypertension 11/05/2012  . Other and unspecified hyperlipidemia 11/05/2012   Past Medical History:  Diagnosis Date  . Allergy   . Arthritis    "all over" (07/10/2013)  . CAD (coronary artery disease)    a. normal cors by cath in 2016  b. 08/2016: admitted with an NSTEMI. Cath showed mild nonobstructive CAD with only 20% mid-LAD stenosis. EF was reduced to 35% and wall motion abnormalities were suggestive of a stress-induced cardiomyopathy.  . Carpal tunnel syndrome of left wrist   . Cerebral aneurysm, nonruptured    3-4 mm in size, stable since 2004 (most recent MRI 2014)  . Colon polyp 2007  . Diverticulitis   . Family history of anesthesia complication    "brother had PONV"  . Gastroesophageal reflux disease   . High cholesterol   . Hypertension   . Insomnia   . Macular degeneration 08/31/2016  . PONV (postoperative nausea and vomiting)   . Restless leg syndrome   . Stroke [facility]) 02/2013   denies residual on 07/10/2013)    Family History  Problem Relation Age of Onset  . Heart disease Brother 2  . Cancer Brother        lung  . Cancer Father        lung  . Hypertension Father   . Diabetes Paternal Aunt   . Cancer Maternal Grandmother   . Stroke Neg Hx     Past Surgical History:  Procedure Laterality Date  . BREAST SURGERY    . CHOLECYSTECTOMY    . JOINT REPLACEMENT    . KNEE ARTHROSCOPY Bilateral   . LEFT HEART CATH AND CORONARY ANGIOGRAPHY N/A 09/01/2016   Procedure: Left Heart Cath and Coronary Angiography;  Surgeon: Wellington Hampshire, MD;  Location: [facility] CV LAB;  Service:  Cardiovascular;  Laterality: N/A;  . LEFT HEART CATHETERIZATION WITH CORONARY ANGIOGRAM N/A 06/20/2014   Procedure: LEFT HEART CATHETERIZATION WITH CORONARY ANGIOGRAM;  Surgeon: [name], MD;  Location: [facility] CATH LAB;  Service: Cardiovascular;  Laterality: N/A;  . TEE WITHOUT CARDIOVERSION N/A 03/14/2013   Procedure: TRANSESOPHAGEAL ECHOCARDIOGRAM (TEE);  Surgeon: Candee Furbish, MD;  Location: [facility] ENDOSCOPY;  Service: Cardiovascular;  Laterality: N/A;  . TOTAL KNEE ARTHROPLASTY Left 07/10/2013  . TOTAL KNEE ARTHROPLASTY Left 07/10/2013   Procedure: TOTAL KNEE ARTHROPLASTY;  Surgeon:  Garald Balding, MD;  Location: [facility];  Service: Orthopedics;  Laterality: Left;  Marland Kitchen VAGINAL HYSTERECTOMY  2000's   Social History   Occupational History  . Occupation: hairdresser    Employer: [employer]  Tobacco Use  . Smoking status: Never Smoker  . Smokeless tobacco: Never Used  Substance and Sexual Activity  . Alcohol use: No  . Drug use: No  . Sexual activity: Not Currently

## 2018-12-13 DIAGNOSIS — N183 Chronic kidney disease, stage 3 (moderate): Secondary | ICD-10-CM | POA: Diagnosis not present

## 2018-12-13 DIAGNOSIS — I129 Hypertensive chronic kidney disease with stage 1 through stage 4 chronic kidney disease, or unspecified chronic kidney disease: Secondary | ICD-10-CM | POA: Diagnosis not present

## 2018-12-13 DIAGNOSIS — E669 Obesity, unspecified: Secondary | ICD-10-CM | POA: Diagnosis not present

## 2018-12-13 DIAGNOSIS — E785 Hyperlipidemia, unspecified: Secondary | ICD-10-CM | POA: Diagnosis not present

## 2018-12-13 DIAGNOSIS — R200 Anesthesia of skin: Secondary | ICD-10-CM | POA: Diagnosis not present

## 2018-12-13 DIAGNOSIS — Z1159 Encounter for screening for other viral diseases: Secondary | ICD-10-CM | POA: Diagnosis not present

## 2018-12-13 DIAGNOSIS — E781 Pure hyperglyceridemia: Secondary | ICD-10-CM | POA: Diagnosis not present

## 2018-12-13 DIAGNOSIS — E538 Deficiency of other specified B group vitamins: Secondary | ICD-10-CM | POA: Diagnosis not present

## 2018-12-13 DIAGNOSIS — F411 Generalized anxiety disorder: Secondary | ICD-10-CM | POA: Diagnosis not present

## 2018-12-14 DIAGNOSIS — R8290 Unspecified abnormal findings in urine: Secondary | ICD-10-CM | POA: Diagnosis not present

## 2018-12-22 ENCOUNTER — Encounter: Payer: Self-pay | Admitting: Physical Medicine and Rehabilitation

## 2018-12-22 ENCOUNTER — Ambulatory Visit (INDEPENDENT_AMBULATORY_CARE_PROVIDER_SITE_OTHER): Payer: PPO | Admitting: Physical Medicine and Rehabilitation

## 2018-12-22 DIAGNOSIS — R202 Paresthesia of skin: Secondary | ICD-10-CM | POA: Diagnosis not present

## 2018-12-22 NOTE — Progress Notes (Signed)
  Numeric Pain Rating Scale and Functional Assessment Average Pain 7   In the last MONTH (on 0-10 scale) has pain interfered with the following?  1. General activity like being  able to carry out your everyday physical activities such as walking, climbing stairs, carrying groceries, or moving a chair?  Rating(10)

## 2018-12-25 NOTE — Procedures (Signed)
EMG & NCV Findings: Evaluation of the right median motor nerve showed prolonged distal onset latency (8.0 ms), reduced amplitude (0.9 mV), and decreased conduction velocity (Elbow-Wrist, 44 m/s).  The right ulnar motor nerve showed decreased conduction velocity (B Elbow-Wrist, 50 m/s) and decreased conduction velocity (A Elbow-B Elbow, 50 m/s).  The right median (across palm) sensory nerve showed no response (Wrist) and no response (Palm).  The right ulnar sensory nerve showed reduced amplitude (11.7 V).  All remaining nerves (as indicated in the following tables) were within normal limits.    Needle evaluation of the right abductor pollicis brevis muscle showed increased insertional activity, increased spontaneous activity, and diminished recruitment.  All remaining muscles (as indicated in the following table) showed no evidence of electrical instability.    Impression: The above electrodiagnostic study is ABNORMAL and reveals evidence of a severe right median nerve entrapment at the wrist (carpal tunnel syndrome) affecting sensory and motor components.   There is no significant electrodiagnostic evidence of any other focal nerve entrapment, brachial plexopathy or cervical radiculopathy.   Recommendations: 1.  Follow-up with referring physician. 2.  Continue current management of symptoms. 3.  **Suggest surgical evaluation.  ___________________________ Laurence Spates FAAPMR Board Certified, American Board of Physical Medicine and Rehabilitation    Nerve Conduction Studies Anti Sensory Summary Table   Stim Site NR Peak (ms) Norm Peak (ms) P-T Amp (V) Norm P-T Amp Site1 Site2 Delta-P (ms) Dist (cm) Vel (m/s) Norm Vel (m/s)  Right Median Acr Palm Anti Sensory (2nd Digit)  32.8C  Wrist *NR  <3.6  >10 Wrist Palm  0.0    Palm *NR  <2.0          Right Radial Anti Sensory (Base 1st Digit)  32.6C  Wrist    2.4 <3.1 12.6  Wrist Base 1st Digit 2.4 0.0    Right Ulnar Anti Sensory (5th Digit)   32.9C  Wrist    3.7 <3.7 *11.7 >15.0 Wrist 5th Digit 3.7 14.0 38 >38   Motor Summary Table   Stim Site NR Onset (ms) Norm Onset (ms) O-P Amp (mV) Norm O-P Amp Site1 Site2 Delta-0 (ms) Dist (cm) Vel (m/s) Norm Vel (m/s)  Right Median Motor (Abd Poll Brev)  32.5C  Wrist    *8.0 <4.2 *0.9 >5 Elbow Wrist 5.0 22.0 *44 >50  Elbow    13.0  0.9         Right Ulnar Motor (Abd Dig Min)  32.5C  Wrist    3.1 <4.2 5.2 >3 B Elbow Wrist 4.4 22.0 *50 >53  B Elbow    7.5  7.0  A Elbow B Elbow 2.2 11.0 *50 >53  A Elbow    9.7  6.0          EMG   Side Muscle Nerve Root Ins Act Fibs Psw Amp Dur Poly Recrt Int Fraser Din Comment  Right Abd Poll Brev Median C8-T1 *Incr *3+ *3+ Nml Nml 0 *Reduced Nml   Right 1stDorInt Ulnar C8-T1 Nml Nml Nml Nml Nml 0 Nml Nml   Right PronatorTeres Median C6-7 Nml Nml Nml Nml Nml 0 Nml Nml   Right Biceps Musculocut C5-6 Nml Nml Nml Nml Nml 0 Nml Nml     Nerve Conduction Studies Anti Sensory Left/Right Comparison   Stim Site L Lat (ms) R Lat (ms) L-R Lat (ms) L Amp (V) R Amp (V) L-R Amp (%) Site1 Site2 L Vel (m/s) R Vel (m/s) L-R Vel (m/s)  Median Acr Palm Anti Sensory (  2nd Digit)  32.8C  Wrist       Wrist Palm     Palm             Radial Anti Sensory (Base 1st Digit)  32.6C  Wrist  2.4   12.6  Wrist Base 1st Digit     Ulnar Anti Sensory (5th Digit)  32.9C  Wrist  3.7   *11.7  Wrist 5th Digit  38    Motor Left/Right Comparison   Stim Site L Lat (ms) R Lat (ms) L-R Lat (ms) L Amp (mV) R Amp (mV) L-R Amp (%) Site1 Site2 L Vel (m/s) R Vel (m/s) L-R Vel (m/s)  Median Motor (Abd Poll Brev)  32.5C  Wrist  *8.0   *0.9  Elbow Wrist  *44   Elbow  13.0   0.9        Ulnar Motor (Abd Dig Min)  32.5C  Wrist  3.1   5.2  B Elbow Wrist  *50   B Elbow  7.5   7.0  A Elbow B Elbow  *50   A Elbow  9.7   6.0           Waveforms:

## 2018-12-25 NOTE — Progress Notes (Signed)
Aimee Hood - 78 y.o. female MRN [id number]  Date of birth: 06-13-1940  Office Visit Note: Visit Date: 12/22/2018 PCP: Kristopher Glee., MD Referred by: Kristopher Glee., MD  Subjective: Chief Complaint  Patient presents with  . Right Hand - Numbness   HPI: Aimee Hood is a 78 y.o. female who comes in today At the request of Dr. [name] for electrodiagnostic study of the right upper limb.  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  She reports chronic worsening numbness in the fingers particularly the radial digits including the ring finger.  She reports difficulty manipulating small objects and gripping and is really had some strength loss.  She denies any frank radicular symptoms.  She denies any diabetes.  She has had no prior electrodiagnostic studies.  ROS Otherwise per HPI.  Assessment & Plan: Visit Diagnoses:  1. Paresthesia of skin     Plan: Impression: The above electrodiagnostic study is ABNORMAL and reveals evidence of a severe right median nerve entrapment at the wrist (carpal tunnel syndrome) affecting sensory and motor components.   There is no significant electrodiagnostic evidence of any other focal nerve entrapment, brachial plexopathy or cervical radiculopathy.   Recommendations: 1.  Follow-up with referring physician. 2.  Continue current management of symptoms. 3.  **Suggest surgical evaluation.  Meds & Orders: No orders of the defined types were placed in this encounter.   Orders Placed This Encounter  Procedures  . NCV with EMG (electromyography)    Follow-up: Return for [name], MD.   Procedures: No procedures performed  EMG & NCV Findings: Evaluation of the right median motor nerve showed prolonged distal onset latency (8.0 ms), reduced amplitude (0.9 mV), and decreased conduction velocity (Elbow-Wrist, 44 m/s).  The right ulnar  motor nerve showed decreased conduction velocity (B Elbow-Wrist, 50 m/s) and decreased conduction velocity (A Elbow-B Elbow, 50 m/s).  The right median (across palm) sensory nerve showed no response (Wrist) and no response (Palm).  The right ulnar sensory nerve showed reduced amplitude (11.7 V).  All remaining nerves (as indicated in the following tables) were within normal limits.    Needle evaluation of the right abductor pollicis brevis muscle showed increased insertional activity, increased spontaneous activity, and diminished recruitment.  All remaining muscles (as indicated in the following table) showed no evidence of electrical instability.    Impression: The above electrodiagnostic study is ABNORMAL and reveals evidence of a severe right median nerve entrapment at the wrist (carpal tunnel syndrome) affecting sensory and motor components.   There is no significant electrodiagnostic evidence of any other focal nerve entrapment, brachial plexopathy or cervical radiculopathy.   Recommendations: 1.  Follow-up with referring physician. 2.  Continue current management of symptoms. 3.  **Suggest surgical evaluation.  ___________________________ Laurence Spates FAAPMR Board Certified, American Board of Physical Medicine and Rehabilitation    Nerve Conduction Studies Anti Sensory Summary Table   Stim Site NR Peak (ms) Norm Peak (ms) P-T Amp (V) Norm P-T Amp Site1 Site2 Delta-P (ms) Dist (cm) Vel (m/s) Norm Vel (m/s)  Right Median Acr Palm Anti Sensory (2nd Digit)  32.8C  Wrist *NR  <3.6  >10 Wrist Palm  0.0    Palm *NR  <2.0          Right Radial Anti Sensory (Base 1st Digit)  32.6C  Wrist    2.4 <3.1 12.6  Wrist Base 1st Digit 2.4 0.0    Right Ulnar Anti Sensory (5th Digit)  32.9C  Wrist    3.7 <3.7 *11.7 >15.0 Wrist 5th Digit 3.7 14.0 38 >38   Motor Summary Table   Stim Site NR Onset (ms) Norm Onset (ms) O-P Amp (mV) Norm O-P Amp Site1 Site2 Delta-0 (ms) Dist (cm) Vel (m/s) Norm  Vel (m/s)  Right Median Motor (Abd Poll Brev)  32.5C  Wrist    *8.0 <4.2 *0.9 >5 Elbow Wrist 5.0 22.0 *44 >50  Elbow    13.0  0.9         Right Ulnar Motor (Abd Dig Min)  32.5C  Wrist    3.1 <4.2 5.2 >3 B Elbow Wrist 4.4 22.0 *50 >53  B Elbow    7.5  7.0  A Elbow B Elbow 2.2 11.0 *50 >53  A Elbow    9.7  6.0          EMG   Side Muscle Nerve Root Ins Act Fibs Psw Amp Dur Poly Recrt Int Fraser Din Comment  Right Abd Poll Brev Median C8-T1 *Incr *3+ *3+ Nml Nml 0 *Reduced Nml   Right 1stDorInt Ulnar C8-T1 Nml Nml Nml Nml Nml 0 Nml Nml   Right PronatorTeres Median C6-7 Nml Nml Nml Nml Nml 0 Nml Nml   Right Biceps Musculocut C5-6 Nml Nml Nml Nml Nml 0 Nml Nml     Nerve Conduction Studies Anti Sensory Left/Right Comparison   Stim Site L Lat (ms) R Lat (ms) L-R Lat (ms) L Amp (V) R Amp (V) L-R Amp (%) Site1 Site2 L Vel (m/s) R Vel (m/s) L-R Vel (m/s)  Median Acr Palm Anti Sensory (2nd Digit)  32.8C  Wrist       Wrist Palm     Palm             Radial Anti Sensory (Base 1st Digit)  32.6C  Wrist  2.4   12.6  Wrist Base 1st Digit     Ulnar Anti Sensory (5th Digit)  32.9C  Wrist  3.7   *11.7  Wrist 5th Digit  38    Motor Left/Right Comparison   Stim Site L Lat (ms) R Lat (ms) L-R Lat (ms) L Amp (mV) R Amp (mV) L-R Amp (%) Site1 Site2 L Vel (m/s) R Vel (m/s) L-R Vel (m/s)  Median Motor (Abd Poll Brev)  32.5C  Wrist  *8.0   *0.9  Elbow Wrist  *44   Elbow  13.0   0.9        Ulnar Motor (Abd Dig Min)  32.5C  Wrist  3.1   5.2  B Elbow Wrist  *50   B Elbow  7.5   7.0  A Elbow B Elbow  *50   A Elbow  9.7   6.0           Waveforms:            Clinical History: No specialty comments available.   She reports that she has never smoked. She has never used smokeless tobacco. No results for input(s): HGBA1C, LABURIC in the last 8760 hours.  Objective:  VS:  HT:    WT:   BMI:     BP:   HR: bpm  TEMP: ( )  RESP:  Physical Exam Musculoskeletal:        General: No swelling,  tenderness or deformity.     Comments: Inspection reveals some evidence of CMC arthritis and flattening of  the right more than left APB but no atrophy of the bilateral  FDI or hand intrinsics. There is no swelling, color changes, allodynia or dystrophic changes. There is 5 out of 5 strength in the bilateral wrist extension, finger abduction and long finger flexion.  There is decreased sensation to light touch in a classic median nerve distribution on the right to include the radial half of the fourth digit but not the ulnar half. There is a negative Hoffmann's test bilaterally.  Skin:    General: Skin is warm and dry.     Findings: No erythema or rash.  Neurological:     General: No focal deficit present.     Mental Status: She is alert and oriented to person, place, and time.     Motor: No weakness or abnormal muscle tone.     Coordination: Coordination normal.  Psychiatric:        Mood and Affect: Mood normal.        Behavior: Behavior normal.     Ortho Exam Imaging: No results found.  Past Medical/Family/Surgical/Social History: Medications & Allergies reviewed per EMR, new medications updated. Patient Active Problem List   Diagnosis Date Noted  . Pain in right knee 12/05/2018  . 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 NSTEMI (non-ST elevated myocardial infarction) ([place])   . Chest pain with moderate risk of acute coronary syndrome 08/31/2016  . Normal coronary arteries-2016 08/31/2016  . Chest pain on exertion 06/11/2014  . Osteoarthritis of left knee 07/12/2013  . S/P total knee replacement using cement 07/10/2013  . Aneurysm, cerebral, nonruptured 04/23/2013  . History of LMCA CVA-TPA 2014 03/09/2013  . Osteoarthritis 11/14/2012  . Restless legs syndrome 11/14/2012  . GERD (gastroesophageal reflux disease) 11/13/2012  . Essential hypertension 11/05/2012  . Other and unspecified hyperlipidemia 11/05/2012   Past Medical History:  Diagnosis Date  . Allergy   . Arthritis     "all over" (07/10/2013)  . CAD (coronary artery disease)    a. normal cors by cath in 2016  b. 08/2016: admitted with an NSTEMI. Cath showed mild nonobstructive CAD with only 20% mid-LAD stenosis. EF was reduced to 35% and wall motion abnormalities were suggestive of a stress-induced cardiomyopathy.  . Carpal tunnel syndrome of left wrist   . Cerebral aneurysm, nonruptured    3-4 mm in size, stable since 2004 (most recent MRI 2014)  . Colon polyp 2007  . Diverticulitis   . Family history of anesthesia complication    "brother had PONV"  . Gastroesophageal reflux disease   . High cholesterol   . Hypertension   . Insomnia   . Macular degeneration 08/31/2016  . PONV (postoperative nausea and vomiting)   . Restless leg syndrome   . Stroke [facility]) 02/2013   denies residual on 07/10/2013)   Family History  Problem Relation Age of Onset  . Heart disease Brother 30  . Cancer Brother        lung  . Cancer Father        lung  . Hypertension Father   . Diabetes Paternal Aunt   . Cancer Maternal Grandmother   . Stroke Neg Hx    Past Surgical History:  Procedure Laterality Date  . BREAST SURGERY    . CHOLECYSTECTOMY    . JOINT REPLACEMENT    . KNEE ARTHROSCOPY Bilateral   . LEFT HEART CATH AND CORONARY ANGIOGRAPHY N/A 09/01/2016   Procedure: Left Heart Cath and Coronary Angiography;  Surgeon: Wellington Hampshire, MD;  Location:  [place] INVASIVE CV LAB;  Service: Cardiovascular;  Laterality: N/A;  . LEFT HEART CATHETERIZATION WITH CORONARY ANGIOGRAM N/A 06/20/2014   Procedure: LEFT HEART CATHETERIZATION WITH CORONARY ANGIOGRAM;  Surgeon: [name], MD;  [facility];  Service: Cardiovascular;  Laterality: N/A;  . TEE WITHOUT CARDIOVERSION N/A 03/14/2013   Procedure: TRANSESOPHAGEAL ECHOCARDIOGRAM (TEE);  Surgeon: Candee Furbish, MD;  Location: [facility] ENDOSCOPY;  Service: Cardiovascular;  Laterality: N/A;  . TOTAL KNEE ARTHROPLASTY Left 07/10/2013  . TOTAL KNEE ARTHROPLASTY Left 07/10/2013    Procedure: TOTAL KNEE ARTHROPLASTY;  Surgeon: Garald Balding, MD;  Location: [place];  Service: Orthopedics;  Laterality: Left;  Marland Kitchen VAGINAL HYSTERECTOMY  2000's   Social History   Occupational History  . Occupation: hairdresser    Employer: [employer]  Tobacco Use  . Smoking status: Never Smoker  . Smokeless tobacco: Never Used  Substance and Sexual Activity  . Alcohol use: No  . Drug use: No  . Sexual activity: Not Currently

## 2018-12-28 ENCOUNTER — Ambulatory Visit: Payer: PPO | Admitting: Orthopaedic Surgery

## 2018-12-29 ENCOUNTER — Encounter: Payer: Self-pay | Admitting: Orthopaedic Surgery

## 2018-12-29 ENCOUNTER — Other Ambulatory Visit: Payer: Self-pay

## 2018-12-29 ENCOUNTER — Ambulatory Visit (INDEPENDENT_AMBULATORY_CARE_PROVIDER_SITE_OTHER): Payer: PPO | Admitting: Orthopaedic Surgery

## 2018-12-29 DIAGNOSIS — G5603 Carpal tunnel syndrome, bilateral upper limbs: Secondary | ICD-10-CM | POA: Diagnosis not present

## 2018-12-29 NOTE — Progress Notes (Signed)
Office Visit Note   Patient: Aimee Hood           Date of Birth: [birth year]           MRN: [id number] Visit Date: 12/29/2018              Requested by: Kristopher Glee., MD [id number] [place],   [id number] PCP: Kristopher Glee., MD   Assessment & Plan: Visit Diagnoses:  1. Carpal tunnel syndrome, bilateral     Plan: EMG and nerve conduction studies demonstrate severe right median nerve entrapment at the wrist affecting both sensory and motor components.  [name] is having considerable trouble and wishes to proceed with surgery.  I discussed this in detail with her.  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  Follow-Up Instructions: Return Will schedule right carpal tunnel release.   Orders:  No orders of the defined types were placed in this encounter.  No orders of the defined types were placed in this encounter.     Procedures: No procedures performed   Clinical Data: No additional findings.   Subjective: Chief Complaint  Patient presents with  . Right Hand - Follow-up  Patient is presenting today for follow up on her right hand numbness. She had EMG study with Dr.Newton on 12/22/2018. No changes since her last visit.   HPI  Review of Systems   Objective: Vital Signs: BP (!) 145/88   Pulse 80   Ht 5' 5.5" (1.664 m)   Wt 230 lb (104.3 kg)   BMI 37.69 kg/m   Physical Exam Constitutional:      Appearance: She is well-developed.  Eyes:     Pupils: Pupils are equal, round, and reactive to light.  Pulmonary:     Effort: Pulmonary effort is normal.  Skin:    General: Skin is warm and dry.  Neurological:     Mental Status: She is alert and oriented to person, place, and time.  Psychiatric:        Behavior: Behavior normal.     Ortho Exam awake alert and oriented x3.  Comfortable sitting.  Right wrist without swelling.  Neurologically intact able to make a full fist and release.  Does  have positive Tinel's and Phalen's over the median nerve.  Some atrophy of the thenar muscles.  Specialty Comments:  No specialty comments available.  Imaging: No results found.   PMFS History: Patient Active Problem List   Diagnosis Date Noted  . Carpal tunnel syndrome, bilateral 12/29/2018  . Pain in right knee 12/05/2018  . 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 NSTEMI (non-ST elevated myocardial infarction) ([place])   . Chest pain with moderate risk of acute coronary syndrome 08/31/2016  . Normal coronary arteries-2016 08/31/2016  . Chest pain on exertion 06/11/2014  . Osteoarthritis of left knee 07/12/2013  . S/P total knee replacement using cement 07/10/2013  . Aneurysm, cerebral, nonruptured 04/23/2013  . History of LMCA CVA-TPA 2014 03/09/2013  . Osteoarthritis 11/14/2012  . Restless legs syndrome 11/14/2012  . GERD (gastroesophageal reflux disease) 11/13/2012  . Essential hypertension 11/05/2012  . Other and unspecified hyperlipidemia 11/05/2012   Past Medical History:  Diagnosis Date  . Allergy   . Arthritis    "all over" (07/10/2013)  . CAD (coronary artery disease)    a. normal cors by cath in 2016  b. 08/2016: admitted with an NSTEMI. Cath showed mild nonobstructive CAD  with only 20% mid-LAD stenosis. EF was reduced to 35% and wall motion abnormalities were suggestive of a stress-induced cardiomyopathy.  . Carpal tunnel syndrome of left wrist   . Cerebral aneurysm, nonruptured    3-4 mm in size, stable since 2004 (most recent MRI 2014)  . Colon polyp 2007  . Diverticulitis   . Family history of anesthesia complication    "brother had PONV"  . Gastroesophageal reflux disease   . High cholesterol   . Hypertension   . Insomnia   . Macular degeneration 08/31/2016  . PONV (postoperative nausea and vomiting)   . Restless leg syndrome   . Stroke [facility]) 02/2013   denies residual on 07/10/2013)    Family History  Problem Relation Age of Onset  . Heart disease  Brother 98  . Cancer Brother        lung  . Cancer Father        lung  . Hypertension Father   . Diabetes Paternal Aunt   . Cancer Maternal Grandmother   . Stroke Neg Hx     Past Surgical History:  Procedure Laterality Date  . BREAST SURGERY    . CHOLECYSTECTOMY    . JOINT REPLACEMENT    . KNEE ARTHROSCOPY Bilateral   . LEFT HEART CATH AND CORONARY ANGIOGRAPHY N/A 09/01/2016   Procedure: Left Heart Cath and Coronary Angiography;  Surgeon: Wellington Hampshire, MD;  Location: [place] CV LAB;  Service: Cardiovascular;  Laterality: N/A;  . LEFT HEART CATHETERIZATION WITH CORONARY ANGIOGRAM N/A 06/20/2014   Procedure: LEFT HEART CATHETERIZATION WITH CORONARY ANGIOGRAM;  Surgeon: [name], MD;  Location: [facility] CATH LAB;  Service: Cardiovascular;  Laterality: N/A;  . TEE WITHOUT CARDIOVERSION N/A 03/14/2013   Procedure: TRANSESOPHAGEAL ECHOCARDIOGRAM (TEE);  Surgeon: Candee Furbish, MD;  Location: [facility] ENDOSCOPY;  Service: Cardiovascular;  Laterality: N/A;  . TOTAL KNEE ARTHROPLASTY Left 07/10/2013  . TOTAL KNEE ARTHROPLASTY Left 07/10/2013   Procedure: TOTAL KNEE ARTHROPLASTY;  Surgeon: Garald Balding, MD;  Location: [place];  Service: Orthopedics;  Laterality: Left;  Marland Kitchen VAGINAL HYSTERECTOMY  2000's   Social History   Occupational History  . Occupation: hairdresser    Employer: [employer]  Tobacco Use  . Smoking status: Never Smoker  . Smokeless tobacco: Never Used  Substance and Sexual Activity  . Alcohol use: No  . Drug use: No  . Sexual activity: Not Currently

## 2019-01-11 ENCOUNTER — Other Ambulatory Visit: Payer: Self-pay | Admitting: Orthopedic Surgery

## 2019-01-11 DIAGNOSIS — G5601 Carpal tunnel syndrome, right upper limb: Secondary | ICD-10-CM | POA: Diagnosis not present

## 2019-01-11 MED ORDER — HYDROCODONE-ACETAMINOPHEN 5-325 MG PO TABS: 1.0000 | ORAL_TABLET | ORAL | 0 refills | Status: AC | PRN

## 2019-01-18 ENCOUNTER — Other Ambulatory Visit: Payer: Self-pay

## 2019-01-18 ENCOUNTER — Ambulatory Visit (INDEPENDENT_AMBULATORY_CARE_PROVIDER_SITE_OTHER): Payer: PPO | Admitting: Orthopaedic Surgery

## 2019-01-18 ENCOUNTER — Encounter: Payer: Self-pay | Admitting: Orthopaedic Surgery

## 2019-01-18 VITALS — BP 157/101 | HR 70 | Ht 65.5 in | Wt 230.0 lb

## 2019-01-18 DIAGNOSIS — G5603 Carpal tunnel syndrome, bilateral upper limbs: Secondary | ICD-10-CM

## 2019-01-18 NOTE — Progress Notes (Signed)
Office Visit Note   Patient: Aimee Hood           Date of Birth: 02-02-1941           MRN: [id number] Visit Date: 01/18/2019              Requested by: Kristopher Glee., MD [id number] [place],   [address] PCP: Kristopher Glee., MD   Assessment & Plan: Visit Diagnoses:  1. Carpal tunnel syndrome, bilateral     Plan: 1 week status post right carpal tunnel release and doing well.  I removed every other stitch and will apply a waterproof Band-Aid and volar wrist splint.  Return in 1 week to remove the remaining stitches.  Doing well and taking Tylenol when needed for pain  Follow-Up Instructions: Return in about 1 week (around 01/25/2019).   Orders:  No orders of the defined types were placed in this encounter.  No orders of the defined types were placed in this encounter.     Procedures: No procedures performed   Clinical Data: No additional findings.   Subjective: Chief Complaint  Patient presents with  . Right Wrist - Routine Post Op    Right carpal tunnel release DOS 01/11/2019  Patient presents today for follow up on her right wrist. She has right carpal tunnel release on 01/11/2019. She is now 1 week out from surgery. Doing well. She is taking tylenol as needed.   HPI  Review of Systems   Objective: Vital Signs: BP (!) 157/101   Pulse 70   Ht 5' 5.5" (1.664 m)   Wt 230 lb (104.3 kg)   BMI 37.69 kg/m   Physical Exam  Ortho Exam right carpal tunnel incision healing without problem.  Still has a little tingling in the tips of the radial 3 digits but "better" feeling in the long finger  Specialty Comments:  No specialty comments available.  Imaging: No results found.   PMFS History: Patient Active Problem List   Diagnosis Date Noted  . Carpal tunnel syndrome, bilateral 12/29/2018  . Pain in right knee 12/05/2018  . 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 NSTEMI (non-ST elevated myocardial infarction) ([place])   . Chest pain with  moderate risk of acute coronary syndrome 08/31/2016  . Normal coronary arteries-2016 08/31/2016  . Chest pain on exertion 06/11/2014  . Osteoarthritis of left knee 07/12/2013  . S/P total knee replacement using cement 07/10/2013  . Aneurysm, cerebral, nonruptured 04/23/2013  . History of LMCA CVA-TPA 2014 03/09/2013  . Osteoarthritis 11/14/2012  . Restless legs syndrome 11/14/2012  . GERD (gastroesophageal reflux disease) 11/13/2012  . Essential hypertension 11/05/2012  . Other and unspecified hyperlipidemia 11/05/2012   Past Medical History:  Diagnosis Date  . Allergy   . Arthritis    "all over" (07/10/2013)  . CAD (coronary artery disease)    a. normal cors by cath in 2016  b. 08/2016: admitted with an NSTEMI. Cath showed mild nonobstructive CAD with only 20% mid-LAD stenosis. EF was reduced to 35% and wall motion abnormalities were suggestive of a stress-induced cardiomyopathy.  . Carpal tunnel syndrome of left wrist   . Cerebral aneurysm, nonruptured    3-4 mm in size, stable since 2004 (most recent MRI 2014)  . Colon polyp 2007  . Diverticulitis   . Family history of anesthesia complication    "brother had PONV"  . Gastroesophageal reflux disease   . High cholesterol   . Hypertension   . Insomnia   .  Macular degeneration 08/31/2016  . PONV (postoperative nausea and vomiting)   . Restless leg syndrome   . Stroke [facility]) 02/2013   denies residual on 07/10/2013)    Family History  Problem Relation Age of Onset  . Heart disease Brother 69  . Cancer Brother        lung  . Cancer Father        lung  . Hypertension Father   . Diabetes Paternal Aunt   . Cancer Maternal Grandmother   . Stroke Neg Hx     Past Surgical History:  Procedure Laterality Date  . BREAST SURGERY    . CHOLECYSTECTOMY    . JOINT REPLACEMENT    . KNEE ARTHROSCOPY Bilateral   . LEFT HEART CATH AND CORONARY ANGIOGRAPHY N/A 09/01/2016   Procedure: Left Heart Cath and Coronary Angiography;  Surgeon:  Wellington Hampshire, MD;  Location: [place] CV LAB;  Service: Cardiovascular;  Laterality: N/A;  . LEFT HEART CATHETERIZATION WITH CORONARY ANGIOGRAM N/A 06/20/2014   Procedure: LEFT HEART CATHETERIZATION WITH CORONARY ANGIOGRAM;  Surgeon: [name], MD;  Location: [facility] CATH LAB;  Service: Cardiovascular;  Laterality: N/A;  . TEE WITHOUT CARDIOVERSION N/A 03/14/2013   Procedure: TRANSESOPHAGEAL ECHOCARDIOGRAM (TEE);  Surgeon: Candee Furbish, MD;  Location: [facility] ENDOSCOPY;  Service: Cardiovascular;  Laterality: N/A;  . TOTAL KNEE ARTHROPLASTY Left 07/10/2013  . TOTAL KNEE ARTHROPLASTY Left 07/10/2013   Procedure: TOTAL KNEE ARTHROPLASTY;  Surgeon: Garald Balding, MD;  Location: [place];  Service: Orthopedics;  Laterality: Left;  Marland Kitchen VAGINAL HYSTERECTOMY  2000's   Social History   Occupational History  . Occupation: hairdresser    Employer: [employer]  Tobacco Use  . Smoking status: Never Smoker  . Smokeless tobacco: Never Used  Substance and Sexual Activity  . Alcohol use: No  . Drug use: No  . Sexual activity: Not Currently

## 2019-01-24 ENCOUNTER — Encounter: Payer: Self-pay | Admitting: Orthopaedic Surgery

## 2019-01-24 ENCOUNTER — Ambulatory Visit (INDEPENDENT_AMBULATORY_CARE_PROVIDER_SITE_OTHER): Payer: PPO | Admitting: Orthopaedic Surgery

## 2019-01-24 ENCOUNTER — Other Ambulatory Visit: Payer: Self-pay

## 2019-01-24 VITALS — Ht 65.5 in | Wt 230.0 lb

## 2019-01-24 DIAGNOSIS — G5603 Carpal tunnel syndrome, bilateral upper limbs: Secondary | ICD-10-CM

## 2019-01-24 NOTE — Progress Notes (Signed)
Office Visit Note   Patient: Aimee Hood           Date of Birth: 1940/12/08           MRN: [id number] Visit Date: 01/24/2019              Requested by: Kristopher Glee., MD [id number] [place],  [place] [address] PCP: Kristopher Glee., MD   Assessment & Plan: Visit Diagnoses:  1. Carpal tunnel syndrome, bilateral     Plan: 2 weeks status post right carpal tunnel release and doing well.  Remaining stitches removed and Steri-Strips applied.  Will wear the splint to protect the incision return in 3 weeks.  Has full range of motion of her fingers and much less numbness and tingling in her fingers that she had preoperatively  Follow-Up Instructions: Return in about 3 weeks (around 02/14/2019).   Orders:  No orders of the defined types were placed in this encounter.  No orders of the defined types were placed in this encounter.     Procedures: No procedures performed   Clinical Data: No additional findings.   Subjective: Chief Complaint  Patient presents with  . Right Hand - Routine Post Op    Carpal tunnel release DOS 01/11/2019  Patient presents today for follow up on her right hand. She is now 2 weeks out from right carpal tunnel release. She said that she is doing well except for some stiffness in her hand. She does not have any numbness.  Has history of mild Dupuytren's in both hands without contractures  HPI  Review of Systems   Objective: Vital Signs: Ht 5' 5.5" (1.664 m)   Wt 230 lb (104.3 kg)   BMI 37.69 kg/m   Physical Exam  Ortho Exam right carpal tunnel incision healing without problem.  The remaining stitches were removed.  Steri-Strips applied.  Full range of motion of her fingers she is able to touch the tips of her fingers to the palm of her hand.  No swelling.  Good opposition of thumb to little finger.  Minimal discomfort about the incision and no evidence of infection  Specialty Comments:  No specialty comments available.   Imaging: No results found.   PMFS History: Patient Active Problem List   Diagnosis Date Noted  . Carpal tunnel syndrome, bilateral 12/29/2018  . Pain in right knee 12/05/2018  . 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 NSTEMI (non-ST elevated myocardial infarction) ([place])   . Chest pain with moderate risk of acute coronary syndrome 08/31/2016  . Normal coronary arteries-2016 08/31/2016  . Chest pain on exertion 06/11/2014  . Osteoarthritis of left knee 07/12/2013  . S/P total knee replacement using cement 07/10/2013  . Aneurysm, cerebral, nonruptured 04/23/2013  . History of LMCA CVA-TPA 2014 03/09/2013  . Osteoarthritis 11/14/2012  . Restless legs syndrome 11/14/2012  . GERD (gastroesophageal reflux disease) 11/13/2012  . Essential hypertension 11/05/2012  . Other and unspecified hyperlipidemia 11/05/2012   Past Medical History:  Diagnosis Date  . Allergy   . Arthritis    "all over" (07/10/2013)  . CAD (coronary artery disease)    a. normal cors by cath in 2016  b. 08/2016: admitted with an NSTEMI. Cath showed mild nonobstructive CAD with only 20% mid-LAD stenosis. EF was reduced to 35% and wall motion abnormalities were suggestive of a stress-induced cardiomyopathy.  . Carpal tunnel syndrome of left wrist   . Cerebral aneurysm, nonruptured    3-4 mm in size,  stable since 2004 (most recent MRI 2014)  . Colon polyp 2007  . Diverticulitis   . Family history of anesthesia complication    "brother had PONV"  . Gastroesophageal reflux disease   . High cholesterol   . Hypertension   . Insomnia   . Macular degeneration 08/31/2016  . PONV (postoperative nausea and vomiting)   . Restless leg syndrome   . Stroke [facility]) 02/2013   denies residual on 07/10/2013)    Family History  Problem Relation Age of Onset  . Heart disease Brother 19  . Cancer Brother        lung  . Cancer Father        lung  . Hypertension Father   . Diabetes Paternal Aunt   . Cancer Maternal Grandmother    . Stroke Neg Hx     Past Surgical History:  Procedure Laterality Date  . BREAST SURGERY    . CHOLECYSTECTOMY    . JOINT REPLACEMENT    . KNEE ARTHROSCOPY Bilateral   . LEFT HEART CATH AND CORONARY ANGIOGRAPHY N/A 09/01/2016   Procedure: Left Heart Cath and Coronary Angiography;  Surgeon: Wellington Hampshire, MD;  Location: [place] CV LAB;  Service: Cardiovascular;  Laterality: N/A;  . LEFT HEART CATHETERIZATION WITH CORONARY ANGIOGRAM N/A 06/20/2014   Procedure: LEFT HEART CATHETERIZATION WITH CORONARY ANGIOGRAM;  Surgeon: [name], MD;  Location: [facility] CATH LAB;  Service: Cardiovascular;  Laterality: N/A;  . TEE WITHOUT CARDIOVERSION N/A 03/14/2013   Procedure: TRANSESOPHAGEAL ECHOCARDIOGRAM (TEE);  Surgeon: Candee Furbish, MD;  Location: [facility] ENDOSCOPY;  Service: Cardiovascular;  Laterality: N/A;  . TOTAL KNEE ARTHROPLASTY Left 07/10/2013  . TOTAL KNEE ARTHROPLASTY Left 07/10/2013   Procedure: TOTAL KNEE ARTHROPLASTY;  Surgeon: Garald Balding, MD;  Location: [place];  Service: Orthopedics;  Laterality: Left;  Marland Kitchen VAGINAL HYSTERECTOMY  2000's   Social History   Occupational History  . Occupation: hairdresser    Employer: [employer]  Tobacco Use  . Smoking status: Never Smoker  . Smokeless tobacco: Never Used  Substance and Sexual Activity  . Alcohol use: No  . Drug use: No  . Sexual activity: Not Currently

## 2019-02-21 ENCOUNTER — Ambulatory Visit: Payer: PPO | Admitting: Orthopaedic Surgery

## 2019-02-22 ENCOUNTER — Encounter: Payer: Self-pay | Admitting: Orthopaedic Surgery

## 2019-02-22 ENCOUNTER — Ambulatory Visit (INDEPENDENT_AMBULATORY_CARE_PROVIDER_SITE_OTHER): Payer: PPO | Admitting: Orthopaedic Surgery

## 2019-02-22 ENCOUNTER — Other Ambulatory Visit: Payer: Self-pay

## 2019-02-22 DIAGNOSIS — G5603 Carpal tunnel syndrome, bilateral upper limbs: Secondary | ICD-10-CM

## 2019-02-22 NOTE — Progress Notes (Signed)
Office Visit Note   Patient: Aimee Hood           Date of Birth: 09-Sep-1940           MRN: [id number] Visit Date: 02/22/2019              Requested by: Kristopher Glee., MD [id number] [place],  [place] [address] PCP: Kristopher Glee., MD   Assessment & Plan: Visit Diagnoses:  1. Carpal tunnel syndrome, bilateral     Plan: 6 weeks status post right carpal tunnel release and doing well.  Still has a feeling of warmth along the area of the incision but it looks fine without evidence of infection.  She has been applying med derma.  No longer has the numbness in her ring finger but still having some numbness in the tip of her long finger.  No swelling.  Preoperative nerve conduction studies demonstrate a severe compression of the median nerve.  I suspect that is going to take months for further healing.  We will see her back in 1 month.  I think she is doing just fine  Follow-Up Instructions: Return in about 1 month (around 03/24/2019).   Orders:  No orders of the defined types were placed in this encounter.  No orders of the defined types were placed in this encounter.     Procedures: No procedures performed   Clinical Data: No additional findings.   Subjective: Chief Complaint  Patient presents with  . Right Hand - Routine Post Op    Right carpal tunnel release DOS 01/11/2019  Patient presents today for follow up on her right hand. She had right carpal tunnel release on 01/11/2019. She is now 6 weeks out from surgery. She said that it feels warm to the touch and occasionally throbs. She is concerned that there may be infection in it, although it looks well. She takes tylenol as needed.   HPI  Review of Systems  Constitutional: Negative for fatigue.  HENT: Negative for ear pain.   Eyes: Negative for pain.  Respiratory: Negative for shortness of breath.   Cardiovascular: Negative for leg swelling.  Gastrointestinal: Negative for constipation and diarrhea.   Endocrine: Negative for cold intolerance and heat intolerance.  Genitourinary: Negative for difficulty urinating.  Musculoskeletal: Negative for joint swelling.  Skin: Negative for rash.  Allergic/Immunologic: Negative for food allergies.  Neurological: Negative for weakness.  Hematological: Does not bruise/bleed easily.  Psychiatric/Behavioral: Positive for sleep disturbance.     Objective: Vital Signs: There were no vitals taken for this visit.  Physical Exam  Ortho Exam right hand incision is healing without a problem.  There is little thickness to the scar but no Tinel's.  She is using med derma.  Full range of motion of her fingers and good opposition of thumb to little finger.  Still has some subjective decreased sensibility of the long finger but not the ring no evidence of infection  Specialty Comments:  No specialty comments available.  Imaging: No results found.   PMFS History: Patient Active Problem List   Diagnosis Date Noted  . Carpal tunnel syndrome, bilateral 12/29/2018  . Pain in right knee 12/05/2018  . 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 NSTEMI (non-ST elevated myocardial infarction) ([place])   . Chest pain with moderate risk of acute coronary syndrome 08/31/2016  . Normal coronary arteries-2016 08/31/2016  . Chest pain on exertion 06/11/2014  . Osteoarthritis of left knee 07/12/2013  . S/P total knee  replacement using cement 07/10/2013  . Aneurysm, cerebral, nonruptured 04/23/2013  . History of LMCA CVA-TPA 2014 03/09/2013  . Osteoarthritis 11/14/2012  . Restless legs syndrome 11/14/2012  . GERD (gastroesophageal reflux disease) 11/13/2012  . Essential hypertension 11/05/2012  . Other and unspecified hyperlipidemia 11/05/2012   Past Medical History:  Diagnosis Date  . Allergy   . Arthritis    "all over" (07/10/2013)  . CAD (coronary artery disease)    a. normal cors by cath in 2016  b. 08/2016: admitted with an NSTEMI. Cath showed mild  nonobstructive CAD with only 20% mid-LAD stenosis. EF was reduced to 35% and wall motion abnormalities were suggestive of a stress-induced cardiomyopathy.  . Carpal tunnel syndrome of left wrist   . Cerebral aneurysm, nonruptured    3-4 mm in size, stable since 2004 (most recent MRI 2014)  . Colon polyp 2007  . Diverticulitis   . Family history of anesthesia complication    "brother had PONV"  . Gastroesophageal reflux disease   . High cholesterol   . Hypertension   . Insomnia   . Macular degeneration 08/31/2016  . PONV (postoperative nausea and vomiting)   . Restless leg syndrome   . Stroke [facility]) 02/2013   denies residual on 07/10/2013)    Family History  Problem Relation Age of Onset  . Heart disease Brother 77  . Cancer Brother        lung  . Cancer Father        lung  . Hypertension Father   . Diabetes Paternal Aunt   . Cancer Maternal Grandmother   . Stroke Neg Hx     Past Surgical History:  Procedure Laterality Date  . BREAST SURGERY    . CHOLECYSTECTOMY    . JOINT REPLACEMENT    . KNEE ARTHROSCOPY Bilateral   . LEFT HEART CATH AND CORONARY ANGIOGRAPHY N/A 09/01/2016   Procedure: Left Heart Cath and Coronary Angiography;  Surgeon: Wellington Hampshire, MD;  Location: [place] CV LAB;  Service: Cardiovascular;  Laterality: N/A;  . LEFT HEART CATHETERIZATION WITH CORONARY ANGIOGRAM N/A 06/20/2014   Procedure: LEFT HEART CATHETERIZATION WITH CORONARY ANGIOGRAM;  Surgeon: [name], MD;  Location: [facility] CATH LAB;  Service: Cardiovascular;  Laterality: N/A;  . TEE WITHOUT CARDIOVERSION N/A 03/14/2013   Procedure: TRANSESOPHAGEAL ECHOCARDIOGRAM (TEE);  Surgeon: Candee Furbish, MD;  Location: [facility] ENDOSCOPY;  Service: Cardiovascular;  Laterality: N/A;  . TOTAL KNEE ARTHROPLASTY Left 07/10/2013  . TOTAL KNEE ARTHROPLASTY Left 07/10/2013   Procedure: TOTAL KNEE ARTHROPLASTY;  Surgeon: Garald Balding, MD;  Location: [place];  Service: Orthopedics;  Laterality: Left;  Marland Kitchen VAGINAL  HYSTERECTOMY  2000's   Social History   Occupational History  . Occupation: hairdresser    Employer: [employer]  Tobacco Use  . Smoking status: Never Smoker  . Smokeless tobacco: Never Used  Substance and Sexual Activity  . Alcohol use: No  . Drug use: No  . Sexual activity: Not Currently

## 2019-02-28 DIAGNOSIS — H43392 Other vitreous opacities, left eye: Secondary | ICD-10-CM | POA: Diagnosis not present

## 2019-02-28 DIAGNOSIS — H43813 Vitreous degeneration, bilateral: Secondary | ICD-10-CM | POA: Diagnosis not present

## 2019-02-28 DIAGNOSIS — H35423 Microcystoid degeneration of retina, bilateral: Secondary | ICD-10-CM | POA: Diagnosis not present

## 2019-02-28 DIAGNOSIS — H353132 Nonexudative age-related macular degeneration, bilateral, intermediate dry stage: Secondary | ICD-10-CM | POA: Diagnosis not present

## 2019-04-10 DIAGNOSIS — Z1231 Encounter for screening mammogram for malignant neoplasm of breast: Secondary | ICD-10-CM | POA: Diagnosis not present

## 2019-04-10 DIAGNOSIS — Z1239 Encounter for other screening for malignant neoplasm of breast: Secondary | ICD-10-CM | POA: Diagnosis not present

## 2019-04-17 DIAGNOSIS — H353131 Nonexudative age-related macular degeneration, bilateral, early dry stage: Secondary | ICD-10-CM | POA: Diagnosis not present

## 2019-06-12 DIAGNOSIS — E785 Hyperlipidemia, unspecified: Secondary | ICD-10-CM | POA: Diagnosis not present

## 2019-06-12 DIAGNOSIS — I129 Hypertensive chronic kidney disease with stage 1 through stage 4 chronic kidney disease, or unspecified chronic kidney disease: Secondary | ICD-10-CM | POA: Diagnosis not present

## 2019-06-12 DIAGNOSIS — E781 Pure hyperglyceridemia: Secondary | ICD-10-CM | POA: Diagnosis not present

## 2019-06-12 DIAGNOSIS — F411 Generalized anxiety disorder: Secondary | ICD-10-CM | POA: Diagnosis not present

## 2019-06-12 DIAGNOSIS — K219 Gastro-esophageal reflux disease without esophagitis: Secondary | ICD-10-CM | POA: Diagnosis not present

## 2019-06-12 DIAGNOSIS — N183 Chronic kidney disease, stage 3 unspecified: Secondary | ICD-10-CM | POA: Diagnosis not present

## 2019-06-12 DIAGNOSIS — E669 Obesity, unspecified: Secondary | ICD-10-CM | POA: Diagnosis not present

## 2019-06-12 DIAGNOSIS — E538 Deficiency of other specified B group vitamins: Secondary | ICD-10-CM | POA: Diagnosis not present

## 2019-08-29 DIAGNOSIS — H353132 Nonexudative age-related macular degeneration, bilateral, intermediate dry stage: Secondary | ICD-10-CM | POA: Diagnosis not present

## 2019-09-21 ENCOUNTER — Encounter: Payer: Self-pay | Admitting: Cardiology

## 2019-10-16 DIAGNOSIS — E669 Obesity, unspecified: Secondary | ICD-10-CM | POA: Diagnosis not present

## 2019-10-16 DIAGNOSIS — I129 Hypertensive chronic kidney disease with stage 1 through stage 4 chronic kidney disease, or unspecified chronic kidney disease: Secondary | ICD-10-CM | POA: Diagnosis not present

## 2019-10-16 DIAGNOSIS — F411 Generalized anxiety disorder: Secondary | ICD-10-CM | POA: Diagnosis not present

## 2019-10-16 DIAGNOSIS — R413 Other amnesia: Secondary | ICD-10-CM | POA: Diagnosis not present

## 2019-10-16 DIAGNOSIS — E781 Pure hyperglyceridemia: Secondary | ICD-10-CM | POA: Diagnosis not present

## 2019-10-16 DIAGNOSIS — N183 Chronic kidney disease, stage 3 unspecified: Secondary | ICD-10-CM | POA: Diagnosis not present

## 2019-10-16 DIAGNOSIS — E785 Hyperlipidemia, unspecified: Secondary | ICD-10-CM | POA: Diagnosis not present

## 2019-10-16 DIAGNOSIS — Z0000 Encounter for general adult medical examination without abnormal findings: Secondary | ICD-10-CM | POA: Diagnosis not present

## 2019-10-16 DIAGNOSIS — H6121 Impacted cerumen, right ear: Secondary | ICD-10-CM | POA: Diagnosis not present

## 2019-10-17 DIAGNOSIS — R8290 Unspecified abnormal findings in urine: Secondary | ICD-10-CM | POA: Diagnosis not present

## 2019-10-24 DIAGNOSIS — G3184 Mild cognitive impairment, so stated: Secondary | ICD-10-CM | POA: Diagnosis not present

## 2019-10-24 DIAGNOSIS — Z8673 Personal history of transient ischemic attack (TIA), and cerebral infarction without residual deficits: Secondary | ICD-10-CM | POA: Diagnosis not present

## 2019-10-24 DIAGNOSIS — F419 Anxiety disorder, unspecified: Secondary | ICD-10-CM | POA: Diagnosis not present

## 2019-10-24 DIAGNOSIS — F329 Major depressive disorder, single episode, unspecified: Secondary | ICD-10-CM | POA: Diagnosis not present

## 2020-01-16 DIAGNOSIS — F411 Generalized anxiety disorder: Secondary | ICD-10-CM | POA: Diagnosis not present

## 2020-01-16 DIAGNOSIS — I129 Hypertensive chronic kidney disease with stage 1 through stage 4 chronic kidney disease, or unspecified chronic kidney disease: Secondary | ICD-10-CM | POA: Diagnosis not present

## 2020-01-16 DIAGNOSIS — E669 Obesity, unspecified: Secondary | ICD-10-CM | POA: Diagnosis not present

## 2020-01-16 DIAGNOSIS — Z23 Encounter for immunization: Secondary | ICD-10-CM | POA: Diagnosis not present

## 2020-01-16 DIAGNOSIS — N183 Chronic kidney disease, stage 3 unspecified: Secondary | ICD-10-CM | POA: Diagnosis not present

## 2020-01-17 DIAGNOSIS — I129 Hypertensive chronic kidney disease with stage 1 through stage 4 chronic kidney disease, or unspecified chronic kidney disease: Secondary | ICD-10-CM | POA: Diagnosis not present

## 2020-01-17 DIAGNOSIS — N183 Chronic kidney disease, stage 3 unspecified: Secondary | ICD-10-CM | POA: Diagnosis not present

## 2020-01-23 DIAGNOSIS — N183 Chronic kidney disease, stage 3 unspecified: Secondary | ICD-10-CM | POA: Diagnosis not present

## 2020-01-23 DIAGNOSIS — I129 Hypertensive chronic kidney disease with stage 1 through stage 4 chronic kidney disease, or unspecified chronic kidney disease: Secondary | ICD-10-CM | POA: Diagnosis not present

## 2020-02-05 DIAGNOSIS — N183 Chronic kidney disease, stage 3 unspecified: Secondary | ICD-10-CM | POA: Diagnosis not present

## 2020-02-05 DIAGNOSIS — I129 Hypertensive chronic kidney disease with stage 1 through stage 4 chronic kidney disease, or unspecified chronic kidney disease: Secondary | ICD-10-CM | POA: Diagnosis not present

## 2020-02-22 DIAGNOSIS — H43813 Vitreous degeneration, bilateral: Secondary | ICD-10-CM | POA: Diagnosis not present

## 2020-02-22 DIAGNOSIS — H353132 Nonexudative age-related macular degeneration, bilateral, intermediate dry stage: Secondary | ICD-10-CM | POA: Diagnosis not present

## 2020-02-22 DIAGNOSIS — H35423 Microcystoid degeneration of retina, bilateral: Secondary | ICD-10-CM | POA: Diagnosis not present

## 2020-02-22 DIAGNOSIS — H43392 Other vitreous opacities, left eye: Secondary | ICD-10-CM | POA: Diagnosis not present

## 2020-02-25 DIAGNOSIS — G309 Alzheimer's disease, unspecified: Secondary | ICD-10-CM | POA: Diagnosis not present

## 2020-02-25 DIAGNOSIS — F0280 Dementia in other diseases classified elsewhere without behavioral disturbance: Secondary | ICD-10-CM | POA: Diagnosis not present

## 2020-02-25 DIAGNOSIS — F0150 Vascular dementia without behavioral disturbance: Secondary | ICD-10-CM | POA: Diagnosis not present

## 2020-04-17 DIAGNOSIS — I129 Hypertensive chronic kidney disease with stage 1 through stage 4 chronic kidney disease, or unspecified chronic kidney disease: Secondary | ICD-10-CM | POA: Diagnosis not present

## 2020-04-17 DIAGNOSIS — N183 Chronic kidney disease, stage 3 unspecified: Secondary | ICD-10-CM | POA: Diagnosis not present

## 2020-04-17 DIAGNOSIS — E785 Hyperlipidemia, unspecified: Secondary | ICD-10-CM | POA: Diagnosis not present

## 2020-04-17 DIAGNOSIS — E669 Obesity, unspecified: Secondary | ICD-10-CM | POA: Diagnosis not present

## 2020-04-17 DIAGNOSIS — F411 Generalized anxiety disorder: Secondary | ICD-10-CM | POA: Diagnosis not present

## 2020-04-25 DIAGNOSIS — I129 Hypertensive chronic kidney disease with stage 1 through stage 4 chronic kidney disease, or unspecified chronic kidney disease: Secondary | ICD-10-CM | POA: Diagnosis not present

## 2020-04-25 DIAGNOSIS — N183 Chronic kidney disease, stage 3 unspecified: Secondary | ICD-10-CM | POA: Diagnosis not present

## 2020-05-01 DIAGNOSIS — Z1231 Encounter for screening mammogram for malignant neoplasm of breast: Secondary | ICD-10-CM | POA: Diagnosis not present

## 2020-05-05 DIAGNOSIS — N289 Disorder of kidney and ureter, unspecified: Secondary | ICD-10-CM | POA: Diagnosis not present

## 2020-06-20 DIAGNOSIS — J209 Acute bronchitis, unspecified: Secondary | ICD-10-CM | POA: Diagnosis not present

## 2020-06-20 DIAGNOSIS — R059 Cough, unspecified: Secondary | ICD-10-CM | POA: Diagnosis not present

## 2020-07-16 DIAGNOSIS — N183 Chronic kidney disease, stage 3 unspecified: Secondary | ICD-10-CM | POA: Diagnosis not present

## 2020-07-16 DIAGNOSIS — E785 Hyperlipidemia, unspecified: Secondary | ICD-10-CM | POA: Diagnosis not present

## 2020-07-16 DIAGNOSIS — F411 Generalized anxiety disorder: Secondary | ICD-10-CM | POA: Diagnosis not present

## 2020-07-16 DIAGNOSIS — E669 Obesity, unspecified: Secondary | ICD-10-CM | POA: Diagnosis not present

## 2020-07-16 DIAGNOSIS — I129 Hypertensive chronic kidney disease with stage 1 through stage 4 chronic kidney disease, or unspecified chronic kidney disease: Secondary | ICD-10-CM | POA: Diagnosis not present

## 2020-07-21 DIAGNOSIS — N183 Chronic kidney disease, stage 3 unspecified: Secondary | ICD-10-CM | POA: Diagnosis not present

## 2020-07-21 DIAGNOSIS — I129 Hypertensive chronic kidney disease with stage 1 through stage 4 chronic kidney disease, or unspecified chronic kidney disease: Secondary | ICD-10-CM | POA: Diagnosis not present

## 2020-08-22 DIAGNOSIS — H353132 Nonexudative age-related macular degeneration, bilateral, intermediate dry stage: Secondary | ICD-10-CM | POA: Diagnosis not present

## 2020-10-21 DIAGNOSIS — E785 Hyperlipidemia, unspecified: Secondary | ICD-10-CM | POA: Diagnosis not present

## 2020-10-21 DIAGNOSIS — I129 Hypertensive chronic kidney disease with stage 1 through stage 4 chronic kidney disease, or unspecified chronic kidney disease: Secondary | ICD-10-CM | POA: Diagnosis not present

## 2020-10-21 DIAGNOSIS — N183 Chronic kidney disease, stage 3 unspecified: Secondary | ICD-10-CM | POA: Diagnosis not present

## 2020-10-21 DIAGNOSIS — E669 Obesity, unspecified: Secondary | ICD-10-CM | POA: Diagnosis not present

## 2020-12-24 DIAGNOSIS — H538 Other visual disturbances: Secondary | ICD-10-CM | POA: Diagnosis not present

## 2020-12-24 DIAGNOSIS — H353131 Nonexudative age-related macular degeneration, bilateral, early dry stage: Secondary | ICD-10-CM | POA: Diagnosis not present

## 2020-12-24 DIAGNOSIS — H43391 Other vitreous opacities, right eye: Secondary | ICD-10-CM | POA: Diagnosis not present

## 2020-12-26 DIAGNOSIS — R300 Dysuria: Secondary | ICD-10-CM | POA: Diagnosis not present

## 2021-02-17 DIAGNOSIS — H43813 Vitreous degeneration, bilateral: Secondary | ICD-10-CM | POA: Diagnosis not present

## 2021-02-17 DIAGNOSIS — H43392 Other vitreous opacities, left eye: Secondary | ICD-10-CM | POA: Diagnosis not present

## 2021-02-17 DIAGNOSIS — H35423 Microcystoid degeneration of retina, bilateral: Secondary | ICD-10-CM | POA: Diagnosis not present

## 2021-02-17 DIAGNOSIS — H353132 Nonexudative age-related macular degeneration, bilateral, intermediate dry stage: Secondary | ICD-10-CM | POA: Diagnosis not present

## 2021-02-24 DIAGNOSIS — E669 Obesity, unspecified: Secondary | ICD-10-CM | POA: Diagnosis not present

## 2021-02-24 DIAGNOSIS — R413 Other amnesia: Secondary | ICD-10-CM | POA: Diagnosis not present

## 2021-02-24 DIAGNOSIS — F411 Generalized anxiety disorder: Secondary | ICD-10-CM | POA: Diagnosis not present

## 2021-02-24 DIAGNOSIS — Z0000 Encounter for general adult medical examination without abnormal findings: Secondary | ICD-10-CM | POA: Diagnosis not present

## 2021-02-24 DIAGNOSIS — E538 Deficiency of other specified B group vitamins: Secondary | ICD-10-CM | POA: Diagnosis not present

## 2021-02-24 DIAGNOSIS — E785 Hyperlipidemia, unspecified: Secondary | ICD-10-CM | POA: Diagnosis not present

## 2021-02-24 DIAGNOSIS — I129 Hypertensive chronic kidney disease with stage 1 through stage 4 chronic kidney disease, or unspecified chronic kidney disease: Secondary | ICD-10-CM | POA: Diagnosis not present

## 2021-02-24 DIAGNOSIS — N183 Chronic kidney disease, stage 3 unspecified: Secondary | ICD-10-CM | POA: Diagnosis not present

## 2021-02-24 DIAGNOSIS — H6121 Impacted cerumen, right ear: Secondary | ICD-10-CM | POA: Diagnosis not present

## 2021-03-09 DIAGNOSIS — M8589 Other specified disorders of bone density and structure, multiple sites: Secondary | ICD-10-CM | POA: Diagnosis not present

## 2021-03-09 DIAGNOSIS — Z780 Asymptomatic menopausal state: Secondary | ICD-10-CM | POA: Diagnosis not present

## 2021-03-09 DIAGNOSIS — Z13820 Encounter for screening for osteoporosis: Secondary | ICD-10-CM | POA: Diagnosis not present

## 2021-03-09 DIAGNOSIS — M8580 Other specified disorders of bone density and structure, unspecified site: Secondary | ICD-10-CM | POA: Diagnosis not present

## 2021-03-09 DIAGNOSIS — Z79890 Hormone replacement therapy (postmenopausal): Secondary | ICD-10-CM | POA: Diagnosis not present

## 2021-07-13 ENCOUNTER — Ambulatory Visit (INDEPENDENT_AMBULATORY_CARE_PROVIDER_SITE_OTHER): Payer: PPO

## 2021-07-13 ENCOUNTER — Ambulatory Visit: Payer: PPO | Admitting: Physician Assistant

## 2021-07-13 ENCOUNTER — Encounter: Payer: Self-pay | Admitting: Physician Assistant

## 2021-07-13 VITALS — Ht 66.0 in

## 2021-07-13 DIAGNOSIS — M25561 Pain in right knee: Secondary | ICD-10-CM

## 2021-07-13 MED ORDER — METHYLPREDNISOLONE ACETATE 40 MG/ML IJ SUSP
80.0000 mg | INTRAMUSCULAR | Status: AC | PRN
  Administered 2021-07-13: 80 mg via INTRA_ARTICULAR

## 2021-07-13 MED ORDER — LIDOCAINE HCL 1 % IJ SOLN
5.0000 mL | INTRAMUSCULAR | Status: AC | PRN
  Administered 2021-07-13: 5 mL

## 2021-07-13 NOTE — Progress Notes (Signed)
? ?Office Visit Note ?  ?Patient: Aimee Hood           ?Date of Birth: July 19, 1940           ?MRN: [id number] ?Visit Date: 07/13/2021 ?             ?Requested by: Kristopher Glee., MD ?[address] ?[address] ?[place],  [place] [address] ?PCP: Kristopher Glee., MD ? ?Chief Complaint  ?Patient presents with  ?? Right Knee - Pain  ? ? ? ? ?HPI: ?Patient is a pleasant 81 year old woman who is status post left knee replacement by Dr. Durward Fortes.  She also has known arthritis in her right knee.  She said 3 weeks ago she was stepping down off an ambulance that her husband was being loaded into.  She misjudged the step and had more squatting and her right knee than she normally does she denies falling on the knee or hurting herself in any other way she has had steroid injections in the past and done well ? ?Assessment & Plan: ?Visit Diagnoses:  ?1. Acute pain of right knee   ? ? ?Plan: Findings consistent with inflammation secondary to her arthritis in her recent misstep.  We will go forward with an injection today.  She understands if she does not get good relief from the injection she should contact us ? ?Follow-Up Instructions: No follow-ups on file.  ? ?Ortho Exam ? ?Patient is alert, oriented, no adenopathy, well-dressed, normal affect, normal respiratory effort. ?Right knee she has no effusion no redness she has some mild warmth.  She does have crepitus with range of motion. ? ?Imaging: ?XR KNEE 3 VIEW RIGHT ? ?Result Date: 07/13/2021 ?Radiographs of her right knee were reviewed today in multiple projections.  She has tricompartmental arthritis with subarticular cysts and sclerosis.  Varus malalignment with bone-on-bone in the medial compartment.  I do not see any acute fractures.  Arthritis has progressed since previous films  ?No images are attached to the encounter. ? ?Labs: ?Lab Results  ?Component Value Date  ? HGBA1C 5.5 03/10/2013  ? REPTSTATUS 07/04/2013 FINAL 07/02/2013  ? CULT  07/02/2013  ?  VIRIDANS  STREPTOCOCCUS ?Performed at [facility]  ? ? ? ?Lab Results  ?Component Value Date  ? ALBUMIN 4.2 08/31/2016  ? ALBUMIN 4.1 11/26/2013  ? ALBUMIN 4.3 07/02/2013  ? ? ?Lab Results  ?Component Value Date  ? MG 2.0 01/13/2010  ? ?Lab Results  ?Component Value Date  ? VD25OH 74 11/06/2012  ? ? ?No results found for: PREALBUMIN ? ?  Latest Ref Rng & Units 09/16/2016  ? 12:10 PM 09/02/2016  ?  3:58 AM 09/01/2016  ? 10:48 AM  ?CBC EXTENDED  ?WBC 3.4 - 10.8 x10E3/uL 7.3   7.4   8.2    ?RBC 3.77 - 5.28 x10E6/uL 4.52   4.20   4.30    ?Hemoglobin 11.1 - 15.9 g/dL 13.1   12.1   13.0    ?HCT 34.0 - 46.6 % 40.2   38.2   38.7    ?Platelets 150 - 379 x10E3/uL 324   236   236    ? ? ? ?Body mass index is 37.12 kg/m?. ? ?Orders:  ?Orders Placed This Encounter  ?Procedures  ?? XR KNEE 3 VIEW RIGHT  ? ?No orders of the defined types were placed in this encounter. ? ? ? Procedures: ?Large Joint Inj: R knee on 07/13/2021 9:07 AM ?Indications: pain and diagnostic evaluation ?Details:  25 G 1.5 in needle, anterolateral approach ? ?Arthrogram: No ? ?Medications: 80 mg methylPREDNISolone acetate 40 MG/ML; 5 mL lidocaine 1 % ?Outcome: tolerated well, no immediate complications ? ?After obtaining verbal consent her medial knee was prepped with alcohol and Betadine.  Was not able to gain access to the joint on the medial compartment.  So lateral side of knee was prepped with Betadine and alcohol.  Knee was injected without difficulty.  Patient self ambulated from the clinic ?Procedure, treatment alternatives, risks and benefits explained, specific risks discussed. Consent was given by the patient.  ? ? ? ?Clinical Data: ?No additional findings. ? ?ROS: ? ?All other systems negative, except as noted in the HPI. ?Review of Systems ? ?Objective: ?Vital Signs: Ht '5\' 6"'$  (1.676 m)   BMI 37.12 kg/m?  ? ?Specialty Comments:  ?No specialty comments available. ? ?PMFS History: ?Patient Active Problem List  ? Diagnosis Date Noted  ?? Carpal tunnel  syndrome, bilateral 12/29/2018  ?? Pain in right knee 12/05/2018  ?? 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  ?? NSTEMI (non-ST elevated myocardial infarction) ([place])   ?? Chest pain with moderate risk of acute coronary syndrome 08/31/2016  ?? Normal coronary arteries-2016 08/31/2016  ?? Chest pain on exertion 06/11/2014  ?? Osteoarthritis of left knee 07/12/2013  ?? S/P total knee replacement using cement 07/10/2013  ?? Aneurysm, cerebral, nonruptured 04/23/2013  ?? History of LMCA CVA-TPA 2014 03/09/2013  ?? Osteoarthritis 11/14/2012  ?? Restless legs syndrome 11/14/2012  ?? GERD (gastroesophageal reflux disease) 11/13/2012  ?? Essential hypertension 11/05/2012  ?? Other and unspecified hyperlipidemia 11/05/2012  ? ?Past Medical History:  ?Diagnosis Date  ?? Allergy   ?? Arthritis   ? "all over" (07/10/2013)  ?? CAD (coronary artery disease)   ? a. normal cors by cath in 2016  b. 08/2016: admitted with an NSTEMI. Cath showed mild nonobstructive CAD with only 20% mid-LAD stenosis. EF was reduced to 35% and wall motion abnormalities were suggestive of a stress-induced cardiomyopathy.  ?? Carpal tunnel syndrome of left wrist   ?? Cerebral aneurysm, nonruptured   ? 3-4 mm in size, stable since 2004 (most recent MRI 2014)  ?? Colon polyp 2007  ?? Diverticulitis   ?? Family history of anesthesia complication   ? "brother had PONV"  ?? Gastroesophageal reflux disease   ?? High cholesterol   ?? Hypertension   ?? Insomnia   ?? Macular degeneration 08/31/2016  ?? PONV (postoperative nausea and vomiting)   ?? Restless leg syndrome   ?? Stroke [facility]) 02/2013  ? denies residual on 07/10/2013)  ?  ?Family History  ?Problem Relation Age of Onset  ?? Heart disease Brother 62  ?? Cancer Brother   ?     lung  ?? Cancer Father   ?     lung  ?? Hypertension Father   ?? Diabetes Paternal Aunt   ?? Cancer Maternal Grandmother   ?? Stroke Neg Hx   ?  ?Past Surgical History:  ?Procedure Laterality Date  ?? BREAST SURGERY    ?? CHOLECYSTECTOMY     ?? JOINT REPLACEMENT    ?? KNEE ARTHROSCOPY Bilateral   ?? LEFT HEART CATH AND CORONARY ANGIOGRAPHY N/A 09/01/2016  ? Procedure: Left Heart Cath and Coronary Angiography;  Surgeon: Wellington Hampshire, MD;  Location: [place] CV LAB;  Service: Cardiovascular;  Laterality: N/A;  ?? LEFT HEART CATHETERIZATION WITH CORONARY ANGIOGRAM N/A 06/20/2014  ? Procedure: LEFT HEART CATHETERIZATION WITH CORONARY ANGIOGRAM;  Surgeon: [name], MD;  Location: [place] CATH LAB;  Service: Cardiovascular;  Laterality: N/A;  ?? TEE WITHOUT CARDIOVERSION N/A 03/14/2013  ? Procedure: TRANSESOPHAGEAL ECHOCARDIOGRAM (TEE);  Surgeon: Candee Furbish, MD;  Location: [facility] ENDOSCOPY;  Service: Cardiovascular;  Laterality: N/A;  ?? TOTAL KNEE ARTHROPLASTY Left 07/10/2013  ?? TOTAL KNEE ARTHROPLASTY Left 07/10/2013  ? Procedure: TOTAL KNEE ARTHROPLASTY;  Surgeon: Garald Balding, MD;  Location: [place];  Service: Orthopedics;  Laterality: Left;  ?? VAGINAL HYSTERECTOMY  2000's  ? ?Social History  ? ?Occupational History  ?? Occupation: hairdresser  ?  Employer: [employer]  ?Tobacco Use  ?? Smoking status: Never  ?? Smokeless tobacco: Never  ?Vaping Use  ?? Vaping Use: Never used  ?Substance and Sexual Activity  ?? Alcohol use: No  ?? Drug use: No  ?? Sexual activity: Not Currently  ? ? ? ? ? ?

## 2021-10-28 ENCOUNTER — Ambulatory Visit (INDEPENDENT_AMBULATORY_CARE_PROVIDER_SITE_OTHER): Payer: PPO | Admitting: Orthopaedic Surgery

## 2021-10-28 ENCOUNTER — Ambulatory Visit (INDEPENDENT_AMBULATORY_CARE_PROVIDER_SITE_OTHER): Payer: PPO

## 2021-10-28 ENCOUNTER — Encounter: Payer: Self-pay | Admitting: Orthopaedic Surgery

## 2021-10-28 DIAGNOSIS — M25511 Pain in right shoulder: Secondary | ICD-10-CM | POA: Diagnosis not present

## 2021-10-28 DIAGNOSIS — M7541 Impingement syndrome of right shoulder: Secondary | ICD-10-CM

## 2021-10-28 MED ORDER — LIDOCAINE HCL 1 % IJ SOLN
2.0000 mL | INTRAMUSCULAR | Status: AC | PRN
  Administered 2021-10-28: 2 mL

## 2021-10-28 MED ORDER — METHYLPREDNISOLONE ACETATE 40 MG/ML IJ SUSP
80.0000 mg | INTRAMUSCULAR | Status: AC | PRN
  Administered 2021-10-28: 80 mg via INTRA_ARTICULAR

## 2021-10-28 MED ORDER — BUPIVACAINE HCL 0.25 % IJ SOLN
2.0000 mL | INTRAMUSCULAR | Status: AC | PRN
  Administered 2021-10-28: 2 mL via INTRA_ARTICULAR

## 2021-10-28 NOTE — Progress Notes (Addendum)
Office Visit Note   Patient: Aimee Hood           Date of Birth: 28-Jun-1940           MRN: [id number] Visit Date: 10/28/2021              Requested by: Kristopher Glee., MD [address] [place],  [place] [address] PCP: Kristopher Glee., MD   Assessment & Plan: Visit Diagnoses:  1. Acute pain of right shoulder   2. Impingement syndrome of right shoulder     Plan: [name] is a pleasant 81 year old woman with a 1 month history of right shoulder pain.  She denies any injury but is the primary caregiver for her husband who has multiple medical problems.  On exam today she had impingement findings and difficulty with pain when she attempted to go behind her back.  She is right-hand dominant.  Her strength is actually good and she has good strength with resisted external and internal rotation.  She does have positive empty can testing.  Findings consistent with rotator cuff tendinitis.  We recommend an injection today and she like to go forward with this as she has had injections in the past and found them helpful.  If she does not get any relief in the next couple weeks she will contact us and we will order an MRI of her right shoulder to evaluate the rotator cuff she can then follow-up once this is completed  Follow-Up Instructions: If no improvement in 2 weeks will call for MRI  Orders:  Orders Placed This Encounter  Procedures   Large Joint Inj: R subacromial bursa   XR Shoulder Right   Meds ordered this encounter  Medications   bupivacaine (MARCAINE) 0.25 % (with pres) injection 2 mL   lidocaine (XYLOCAINE) 1 % (with pres) injection 2 mL   methylPREDNISolone acetate (DEPO-MEDROL) injection 80 mg      Procedures: Large Joint Inj: R subacromial bursa on 10/28/2021 10:32 AM Indications: diagnostic evaluation and pain Details: 25 G 1.5 in needle, anterior approach  Arthrogram: No  Medications: 2 mL lidocaine 1 %; 80 mg methylPREDNISolone acetate 40 MG/ML; 2 mL  bupivacaine 0.25 % Outcome: tolerated well, no immediate complications Procedure, treatment alternatives, risks and benefits explained, specific risks discussed. Consent was given by the patient.      Clinical Data: No additional findings.   Subjective: Chief Complaint  Patient presents with   Right Shoulder - Follow-up   Patient presents today for a new problem for her right shoulder. She states that pain was noticed around 1 month ago however she denies having any recent injuries or falls. She states that her husband is unable to move around on his own and she is having to help lift and move. She has had no previous shoulder surgeries at this time.   Review of Systems  All other systems reviewed and are negative.    Objective: Vital Signs: There were no vitals taken for this visit.  Physical Exam Pulmonary:     Effort: Pulmonary effort is normal.  Skin:    General: Skin is warm and dry.  Neurological:     Mental Status: She is alert.     Ortho Exam Examination of her right shoulder she does have forward elevation with just a small amount of pain but no tightness.  She does not want to internally rotate behind her back because this produces pain.  Her strength is excellent  with resisted external and internal rotation and resisted abduction.  Her sensation is intact.  She does have positive empty can test and impingement findings.  Speed's Test is negative Specialty Comments:  No specialty comments available.  Imaging: XR Shoulder Right  Result Date: 10/28/2021 Radiographs of her right shoulder were reviewed today.  She has congruent joint spacing between the humeral head and the glenoid fossa.  She has no evidence of any acute fractures or other osseous injuries.  She does have some sclerotic changes with some hypertrophic bone in the acromioclavicular joint.  She also has some early arthritis in the glenohumeral joint with small peripheral osteophytes.  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   NSTEMI (non-ST elevated myocardial infarction) ([place])    Chest pain with moderate risk of acute coronary syndrome 08/31/2016   Normal coronary arteries-2016 08/31/2016   Chest pain on exertion 06/11/2014   Osteoarthritis of left knee 07/12/2013   S/P total knee replacement using cement 07/10/2013   Aneurysm, cerebral, nonruptured 04/23/2013   History of LMCA CVA-TPA 2014 03/09/2013   Osteoarthritis 11/14/2012   Restless legs syndrome 11/14/2012   GERD (gastroesophageal reflux disease) 11/13/2012   Essential hypertension 11/05/2012   Other and unspecified hyperlipidemia 11/05/2012   Past Medical History:  Diagnosis Date   Allergy    Arthritis    "all over" (07/10/2013)   CAD (coronary artery disease)    a. normal cors by cath in 2016  b. 08/2016: admitted with an NSTEMI. Cath showed mild nonobstructive CAD with only 20% mid-LAD stenosis. EF was reduced to 35% and wall motion abnormalities were suggestive of a stress-induced cardiomyopathy.   Carpal tunnel syndrome of left wrist    Cerebral aneurysm, nonruptured    3-4 mm in size, stable since 2004 (most recent MRI 2014)   Colon polyp 2007   Diverticulitis    Family history of anesthesia complication    "brother had PONV"   Gastroesophageal reflux disease    High cholesterol    Hypertension    Insomnia    Macular degeneration 08/31/2016   PONV (postoperative nausea and vomiting)    Restless leg syndrome    Stroke [facility]) 02/2013   denies residual on 07/10/2013)    Family History  Problem Relation Age of Onset   Heart disease Brother 67   Cancer Brother        lung   Cancer Father        lung   Hypertension Father    Diabetes Paternal Aunt    Cancer  Maternal Grandmother    Stroke Neg Hx     Past Surgical History:  Procedure Laterality Date   BREAST SURGERY     CHOLECYSTECTOMY     JOINT REPLACEMENT     KNEE ARTHROSCOPY Bilateral    LEFT HEART CATH AND CORONARY ANGIOGRAPHY N/A 09/01/2016   Procedure: Left Heart Cath and Coronary Angiography;  Surgeon: Wellington Hampshire, MD;  Location: [facility] CV [facility];  Service: Cardiovascular;  Laterality: N/A;   LEFT HEART CATHETERIZATION WITH CORONARY ANGIOGRAM N/A 06/20/2014   Procedure: LEFT HEART CATHETERIZATION WITH CORONARY ANGIOGRAM;  Surgeon: [name], MD;  Location: [facility] CATH LAB;  Service: Cardiovascular;  Laterality: N/A;   TEE WITHOUT CARDIOVERSION N/A 03/14/2013  Procedure: TRANSESOPHAGEAL ECHOCARDIOGRAM (TEE);  Surgeon: Candee Furbish, MD;  Location: [facility] ENDOSCOPY;  Service: Cardiovascular;  Laterality: N/A;   TOTAL KNEE ARTHROPLASTY Left 07/10/2013   TOTAL KNEE ARTHROPLASTY Left 07/10/2013   Procedure: TOTAL KNEE ARTHROPLASTY;  Surgeon: Garald Balding, MD;  Location: [facility];  Service: Orthopedics;  Laterality: Left;   VAGINAL HYSTERECTOMY  2000's   Social History   Occupational History   Occupation: hairdresser    Employer: [employer]  Tobacco Use   Smoking status: Never   Smokeless tobacco: Never  Vaping Use   Vaping Use: Never used  Substance and Sexual Activity   Alcohol use: No   Drug use: No   Sexual activity: Not Currently

## 2022-01-27 ENCOUNTER — Ambulatory Visit (INDEPENDENT_AMBULATORY_CARE_PROVIDER_SITE_OTHER): Payer: PPO | Admitting: Orthopaedic Surgery

## 2022-01-27 ENCOUNTER — Encounter: Payer: Self-pay | Admitting: Orthopaedic Surgery

## 2022-01-27 DIAGNOSIS — M7541 Impingement syndrome of right shoulder: Secondary | ICD-10-CM

## 2022-01-27 MED ORDER — LIDOCAINE HCL 2 % IJ SOLN
2.0000 mL | INTRAMUSCULAR | Status: AC | PRN
  Administered 2022-01-27: 2 mL

## 2022-01-27 MED ORDER — METHYLPREDNISOLONE ACETATE 40 MG/ML IJ SUSP
80.0000 mg | INTRAMUSCULAR | Status: AC | PRN
  Administered 2022-01-27: 80 mg via INTRA_ARTICULAR

## 2022-01-27 MED ORDER — BUPIVACAINE HCL 0.25 % IJ SOLN
2.0000 mL | INTRAMUSCULAR | Status: AC | PRN
  Administered 2022-01-27: 2 mL via INTRA_ARTICULAR

## 2022-01-27 NOTE — Progress Notes (Signed)
Office Visit Note   Patient: Aimee Hood           Date of Birth: [birth year]           MRN: [id number] Visit Date: 01/27/2022              Requested by: Kristopher Glee., MD [address] [place],   [address] PCP: Kristopher Glee., MD   Assessment & Plan: Visit Diagnoses:  1. Impingement syndrome of right shoulder     Plan: Aimee Hood was last seen in July for evaluation of right shoulder pain.  She had positive impingement and x-rays demonstrate some very minimal degenerative changes the glenohumeral joint.  She notes she did well with a subacromial cortisone injection for several months only to have it recur.  She is a caregiver for her husband who has multiple medical problems and does not want to consider further diagnostic studies or even surgery should she have a rotator cuff tear so she is happy to receive another cortisone injection and will let me know if she wants to proceed with an MRI scan  Follow-Up Instructions: Return if symptoms worsen or fail to improve.   Orders:  No orders of the defined types were placed in this encounter.  No orders of the defined types were placed in this encounter.     Procedures: Large Joint Inj: R subacromial bursa on 01/27/2022 2:35 PM Indications: pain and diagnostic evaluation Details: 25 G 1.5 in needle, anterolateral approach  Arthrogram: No  Medications: 2 mL lidocaine 2 %; 80 mg methylPREDNISolone acetate 40 MG/ML; 2 mL bupivacaine 0.25 % Consent was given by the patient. Immediately prior to procedure a time out was called to verify the correct patient, procedure, equipment, support staff and site/side marked as required. Patient was prepped and draped in the usual sterile fashion.       Clinical Data: No additional findings.   Subjective: Chief Complaint  Patient presents with   Right Shoulder - Pain  Patient presents today for chronic right shoulder pain. She received a cortisone injection in July  and wants to get another one today.   HPI  Review of Systems   Objective: Vital Signs: There were no vitals taken for this visit.  Physical Exam Constitutional:      Appearance: She is well-developed.  Eyes:     Pupils: Pupils are equal, round, and reactive to light.  Pulmonary:     Effort: Pulmonary effort is normal.  Skin:    General: Skin is warm and dry.  Neurological:     Mental Status: She is alert and oriented to person, place, and time.  Psychiatric:        Behavior: Behavior normal.     Ortho Exam awake alert and oriented x3.  Comfortable sitting.  Right shoulder exam with positive impingement on the extreme of internal/external rotation.  Appears to have good strength.  Biceps intact.  Skin intact.  Some prominence of the [facility] joint but no pain.  There is some anterior and lateral subacromial pain but she is able to place her arm over her head with good abduction.  Specialty Comments:  No specialty comments available.  Imaging: No results found.   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   NSTEMI (non-ST  elevated myocardial infarction) ([place])    Chest pain with moderate risk of acute coronary syndrome 08/31/2016   Normal coronary arteries-2016 08/31/2016   Chest pain on exertion 06/11/2014   Osteoarthritis of left knee 07/12/2013   S/P total knee replacement using cement 07/10/2013   Aneurysm, cerebral, nonruptured 04/23/2013   History of LMCA CVA-TPA 2014 03/09/2013   Osteoarthritis 11/14/2012   Restless legs syndrome 11/14/2012   GERD (gastroesophageal reflux disease) 11/13/2012   Essential hypertension 11/05/2012   Other and unspecified hyperlipidemia 11/05/2012   Past Medical History:  Diagnosis Date   Allergy    Arthritis    "all over" (07/10/2013)   CAD  (coronary artery disease)    a. normal cors by cath in 2016  b. 08/2016: admitted with an NSTEMI. Cath showed mild nonobstructive CAD with only 20% mid-LAD stenosis. EF was reduced to 35% and wall motion abnormalities were suggestive of a stress-induced cardiomyopathy.   Carpal tunnel syndrome of left wrist    Cerebral aneurysm, nonruptured    3-4 mm in size, stable since 2004 (most recent MRI 2014)   Colon polyp 2007   Diverticulitis    Family history of anesthesia complication    "brother had PONV"   Gastroesophageal reflux disease    High cholesterol    Hypertension    Insomnia    Macular degeneration 08/31/2016   PONV (postoperative nausea and vomiting)    Restless leg syndrome    Stroke [facility]) 02/2013   denies residual on 07/10/2013)    Family History  Problem Relation Age of Onset   Heart disease Brother 65   Cancer Brother        lung   Cancer Father        lung   Hypertension Father    Diabetes Paternal Aunt    Cancer Maternal Grandmother    Stroke Neg Hx     Past Surgical History:  Procedure Laterality Date   BREAST SURGERY     CHOLECYSTECTOMY     JOINT REPLACEMENT     KNEE ARTHROSCOPY Bilateral    LEFT HEART CATH AND CORONARY ANGIOGRAPHY N/A 09/01/2016   Procedure: Left Heart Cath and Coronary Angiography;  Surgeon: Wellington Hampshire, MD;  Location: [place] CV [facility];  Service: Cardiovascular;  Laterality: N/A;   LEFT HEART CATHETERIZATION WITH CORONARY ANGIOGRAM N/A 06/20/2014   Procedure: LEFT HEART CATHETERIZATION WITH CORONARY ANGIOGRAM;  Surgeon: [name], MD;  Location: [facility] CATH LAB;  Service: Cardiovascular;  Laterality: N/A;   TEE WITHOUT CARDIOVERSION N/A 03/14/2013   Procedure: TRANSESOPHAGEAL ECHOCARDIOGRAM (TEE);  Surgeon: Candee Furbish, MD;  Location: [facility] ENDOSCOPY;  Service: Cardiovascular;  Laterality: N/A;   TOTAL KNEE ARTHROPLASTY Left 07/10/2013   TOTAL KNEE ARTHROPLASTY Left 07/10/2013   Procedure: TOTAL KNEE ARTHROPLASTY;  Surgeon: Garald Balding, MD;  Location: [place];  Service: Orthopedics;  Laterality: Left;   VAGINAL HYSTERECTOMY  2000's   Social History   Occupational History   Occupation: hairdresser    Employer: [employer]  Tobacco Use   Smoking status: Never   Smokeless tobacco: Never  Vaping Use   Vaping Use: Never used  Substance and Sexual Activity   Alcohol use: No   Drug use: No   Sexual activity: Not Currently

## 2023-07-04 ENCOUNTER — Encounter (HOSPITAL_COMMUNITY): Payer: Self-pay

## 2023-07-04 ENCOUNTER — Emergency Department (HOSPITAL_COMMUNITY)

## 2023-07-04 ENCOUNTER — Other Ambulatory Visit: Payer: Self-pay

## 2023-07-04 ENCOUNTER — Observation Stay (HOSPITAL_COMMUNITY)
Admission: EM | Admit: 2023-07-04 | Discharge: 2023-07-05 | Disposition: A | Discharge status: Home / Self Care | Attending: Internal Medicine | Admitting: Internal Medicine

## 2023-07-04 DIAGNOSIS — I110 Hypertensive heart disease with heart failure: Secondary | ICD-10-CM | POA: Diagnosis not present

## 2023-07-04 DIAGNOSIS — Z96652 Presence of left artificial knee joint: Secondary | ICD-10-CM | POA: Insufficient documentation

## 2023-07-04 DIAGNOSIS — I509 Heart failure, unspecified: Secondary | ICD-10-CM | POA: Diagnosis not present

## 2023-07-04 DIAGNOSIS — E785 Hyperlipidemia, unspecified: Secondary | ICD-10-CM | POA: Insufficient documentation

## 2023-07-04 DIAGNOSIS — K219 Gastro-esophageal reflux disease without esophagitis: Secondary | ICD-10-CM | POA: Diagnosis not present

## 2023-07-04 DIAGNOSIS — R531 Weakness: Principal | ICD-10-CM

## 2023-07-04 DIAGNOSIS — Z7982 Long term (current) use of aspirin: Secondary | ICD-10-CM | POA: Insufficient documentation

## 2023-07-04 DIAGNOSIS — I6621 Occlusion and stenosis of right posterior cerebral artery: Secondary | ICD-10-CM | POA: Insufficient documentation

## 2023-07-04 DIAGNOSIS — I639 Cerebral infarction, unspecified: Secondary | ICD-10-CM | POA: Diagnosis not present

## 2023-07-04 DIAGNOSIS — I63532 Cerebral infarction due to unspecified occlusion or stenosis of left posterior cerebral artery: Secondary | ICD-10-CM | POA: Diagnosis not present

## 2023-07-04 DIAGNOSIS — I10 Essential (primary) hypertension: Secondary | ICD-10-CM | POA: Diagnosis present

## 2023-07-04 DIAGNOSIS — F419 Anxiety disorder, unspecified: Secondary | ICD-10-CM | POA: Diagnosis not present

## 2023-07-04 DIAGNOSIS — Z8673 Personal history of transient ischemic attack (TIA), and cerebral infarction without residual deficits: Secondary | ICD-10-CM | POA: Insufficient documentation

## 2023-07-04 DIAGNOSIS — Z79899 Other long term (current) drug therapy: Secondary | ICD-10-CM | POA: Diagnosis not present

## 2023-07-04 DIAGNOSIS — R29705 NIHSS score 5: Secondary | ICD-10-CM

## 2023-07-04 DIAGNOSIS — R29818 Other symptoms and signs involving the nervous system: Secondary | ICD-10-CM | POA: Insufficient documentation

## 2023-07-04 DIAGNOSIS — I5181 Takotsubo syndrome: Secondary | ICD-10-CM | POA: Diagnosis present

## 2023-07-04 DIAGNOSIS — G8194 Hemiplegia, unspecified affecting left nondominant side: Secondary | ICD-10-CM | POA: Diagnosis not present

## 2023-07-04 DIAGNOSIS — I2510 Atherosclerotic heart disease of native coronary artery without angina pectoris: Secondary | ICD-10-CM | POA: Diagnosis not present

## 2023-07-04 DIAGNOSIS — F411 Generalized anxiety disorder: Secondary | ICD-10-CM | POA: Insufficient documentation

## 2023-07-04 LAB — DIFFERENTIAL
Abs Immature Granulocytes: 0.01 10*3/uL (ref 0.00–0.07)
Basophils Absolute: 0 10*3/uL (ref 0.0–0.1)
Basophils Relative: 1 %
Eosinophils Absolute: 0.1 10*3/uL (ref 0.0–0.5)
Eosinophils Relative: 3 %
Immature Granulocytes: 0 %
Lymphocytes Relative: 25 %
Lymphs Abs: 1.3 10*3/uL (ref 0.7–4.0)
Monocytes Absolute: 0.6 10*3/uL (ref 0.1–1.0)
Monocytes Relative: 11 %
Neutro Abs: 3.2 10*3/uL (ref 1.7–7.7)
Neutrophils Relative %: 60 %

## 2023-07-04 LAB — CBG MONITORING, ED: Glucose-Capillary: 98 mg/dL (ref 70–99)

## 2023-07-04 LAB — CBC
HCT: 38.8 % (ref 36.0–46.0)
Hemoglobin: 12.2 g/dL (ref 12.0–15.0)
MCH: 28.9 pg (ref 26.0–34.0)
MCHC: 31.4 g/dL (ref 30.0–36.0)
MCV: 91.9 fL (ref 80.0–100.0)
Platelets: 279 10*3/uL (ref 150–400)
RBC: 4.22 MIL/uL (ref 3.87–5.11)
RDW: 12.9 % (ref 11.5–15.5)
WBC: 5.3 10*3/uL (ref 4.0–10.5)
nRBC: 0 % (ref 0.0–0.2)

## 2023-07-04 LAB — I-STAT CHEM 8, ED
BUN: 20 mg/dL (ref 8–23)
Calcium, Ion: 1.24 mmol/L (ref 1.15–1.40)
Chloride: 104 mmol/L (ref 98–111)
Creatinine, Ser: 1.2 mg/dL — ABNORMAL HIGH (ref 0.44–1.00)
Glucose, Bld: 103 mg/dL — ABNORMAL HIGH (ref 70–99)
HCT: 39 % (ref 36.0–46.0)
Hemoglobin: 13.3 g/dL (ref 12.0–15.0)
Potassium: 4 mmol/L (ref 3.5–5.1)
Sodium: 140 mmol/L (ref 135–145)
TCO2: 26 mmol/L (ref 22–32)

## 2023-07-04 LAB — COMPREHENSIVE METABOLIC PANEL WITH GFR
ALT: 11 U/L (ref 0–44)
AST: 19 U/L (ref 15–41)
Albumin: 3.6 g/dL (ref 3.5–5.0)
Alkaline Phosphatase: 55 U/L (ref 38–126)
Anion gap: 9 (ref 5–15)
BUN: 18 mg/dL (ref 8–23)
CO2: 24 mmol/L (ref 22–32)
Calcium: 9.5 mg/dL (ref 8.9–10.3)
Chloride: 105 mmol/L (ref 98–111)
Creatinine, Ser: 1.12 mg/dL — ABNORMAL HIGH (ref 0.44–1.00)
GFR, Estimated: 50 mL/min — ABNORMAL LOW
Glucose, Bld: 107 mg/dL — ABNORMAL HIGH (ref 70–99)
Potassium: 4 mmol/L (ref 3.5–5.1)
Sodium: 138 mmol/L (ref 135–145)
Total Bilirubin: 0.9 mg/dL (ref 0.0–1.2)
Total Protein: 6.3 g/dL — ABNORMAL LOW (ref 6.5–8.1)

## 2023-07-04 LAB — ETHANOL: Alcohol, Ethyl (B): <10 mg/dL (ref <10)

## 2023-07-04 LAB — LIPID PANEL
Cholesterol: 117 mg/dL (ref 0–200)
HDL: 43 mg/dL (ref >40)
LDL Cholesterol: 38 mg/dL (ref 0–99)
Total CHOL/HDL Ratio: 2.7 ratio
Triglycerides: 181 mg/dL — ABNORMAL HIGH (ref <150)
VLDL: 36 mg/dL (ref 0–40)

## 2023-07-04 LAB — PROTIME-INR
INR: 1 (ref 0.8–1.2)
Prothrombin Time: 13.6 s (ref 11.4–15.2)

## 2023-07-04 LAB — HEMOGLOBIN A1C
Hgb A1c MFr Bld: 5 % (ref 4.8–5.6)
Mean Plasma Glucose: 96.8 mg/dL

## 2023-07-04 LAB — APTT: aPTT: 30 s (ref 24–36)

## 2023-07-04 MED ORDER — CLONAZEPAM 0.5 MG PO TABS: 0.5000 mg | ORAL_TABLET | Freq: Two times a day (BID) | ORAL | Status: DC | PRN

## 2023-07-04 MED ORDER — CLOPIDOGREL BISULFATE 75 MG PO TABS
75.0000 mg | ORAL_TABLET | Freq: Every day | ORAL | Status: DC
  Administered 2023-07-05: 75 mg via ORAL
  Filled 2023-07-04 (×2): qty 1

## 2023-07-04 MED ORDER — ACETAMINOPHEN 325 MG PO TABS: 650.0000 mg | ORAL_TABLET | Freq: Four times a day (QID) | ORAL | Status: DC | PRN

## 2023-07-04 MED ORDER — VITAMIN D 25 MCG (1000 UNIT) PO TABS
2000.0000 [IU] | ORAL_TABLET | Freq: Every day | ORAL | Status: DC
  Administered 2023-07-05: 2000 [IU] via ORAL
  Filled 2023-07-04: qty 2

## 2023-07-04 MED ORDER — ACETAMINOPHEN 650 MG RE SUPP: 650.0000 mg | Freq: Four times a day (QID) | RECTAL | Status: DC | PRN

## 2023-07-04 MED ORDER — DULOXETINE HCL 60 MG PO CPEP
60.0000 mg | ORAL_CAPSULE | Freq: Every day | ORAL | Status: DC
  Administered 2023-07-04: 60 mg via ORAL
  Filled 2023-07-04: qty 1

## 2023-07-04 MED ORDER — SODIUM CHLORIDE 0.9% FLUSH
3.0000 mL | Freq: Once | INTRAVENOUS | Status: AC
  Administered 2023-07-04: 3 mL via INTRAVENOUS

## 2023-07-04 MED ORDER — FENOFIBRATE 160 MG PO TABS
160.0000 mg | ORAL_TABLET | Freq: Every day | ORAL | Status: DC
  Administered 2023-07-04 – 2023-07-05 (×2): 160 mg via ORAL
  Filled 2023-07-04 (×3): qty 1

## 2023-07-04 MED ORDER — VITAMIN B-12 1000 MCG PO TABS
1000.0000 ug | ORAL_TABLET | Freq: Every day | ORAL | Status: DC
  Administered 2023-07-05: 1000 ug via ORAL
  Filled 2023-07-04: qty 1

## 2023-07-04 MED ORDER — ROSUVASTATIN CALCIUM 20 MG PO TABS
40.0000 mg | ORAL_TABLET | Freq: Every day | ORAL | Status: DC
  Administered 2023-07-04: 40 mg via ORAL
  Filled 2023-07-04: qty 2

## 2023-07-04 MED ORDER — ASPIRIN 81 MG PO TBEC
81.0000 mg | DELAYED_RELEASE_TABLET | Freq: Every day | ORAL | Status: DC
  Administered 2023-07-05: 81 mg via ORAL
  Filled 2023-07-04 (×3): qty 1

## 2023-07-04 MED ORDER — TRAZODONE HCL 50 MG PO TABS
50.0000 mg | ORAL_TABLET | Freq: Every day | ORAL | Status: DC
  Administered 2023-07-04: 50 mg via ORAL
  Filled 2023-07-04: qty 1

## 2023-07-04 MED ORDER — PANTOPRAZOLE SODIUM 40 MG PO TBEC
40.0000 mg | DELAYED_RELEASE_TABLET | Freq: Every day | ORAL | Status: DC
  Administered 2023-07-05: 40 mg via ORAL
  Filled 2023-07-04: qty 1

## 2023-07-04 MED ORDER — ASPIRIN 81 MG PO CHEW
324.0000 mg | CHEWABLE_TABLET | Freq: Once | ORAL | Status: AC
  Administered 2023-07-04: 324 mg via ORAL
  Filled 2023-07-04: qty 4

## 2023-07-04 MED ORDER — CLOPIDOGREL BISULFATE 75 MG PO TABS
300.0000 mg | ORAL_TABLET | Freq: Once | ORAL | Status: AC
  Administered 2023-07-04: 300 mg via ORAL
  Filled 2023-07-04: qty 4

## 2023-07-04 MED ORDER — IOHEXOL 350 MG/ML SOLN
100.0000 mL | Freq: Once | INTRAVENOUS | Status: AC | PRN
Start: 2023-07-04 — End: 2023-07-04
  Administered 2023-07-04: 100 mL via INTRAVENOUS

## 2023-07-04 MED ORDER — DULOXETINE HCL 30 MG PO CPEP
30.0000 mg | ORAL_CAPSULE | ORAL | Status: DC
  Administered 2023-07-05: 30 mg via ORAL
  Filled 2023-07-04: qty 1

## 2023-07-04 NOTE — Hospital Course (Addendum)
  Medical:left MCA cortical CVA s/p TPA in 2014 without residual deficit, Cranial aneurysm, HTN, HLD   - Left sided deficits  - Aspirin and plavix   Woke up this morning to go to the bathroom and felt like she was going to fall and had weakness of her Legs. She states it started at 11pm last night. She states her speech was not concerning. With concerns with her weakness and imbalance she states she started to walk with a walker today. Went to the doctor most of February she had been dealing with a pneumonia. She doesn't endorse fever, chills, dizzy, lightheadness, cough congestion, falls, syncope, shortness of breath, chest pain, or N/V/D. Left leg currently feels very heavy but she denies any numbness or tingling of the right lower extremity. She does endorse earlier having some right upper extremity tingling that has improved. She notes that she is on aspirin but she is not on a blood thinner.   Lives with: Pura Spice with son, Minerva Areola Occupation: Hairdresser in the past  Support: Good support in her family  Level of function: Independent in All ADLs and iADLs, walks without a walker  PCP: Dr. Luiz Iron, MD Substance use: No substance use   Father: Lung cancer  Brother: Lung Cancer

## 2023-07-04 NOTE — ED Provider Notes (Signed)
 [facility] AT [facility] Provider Note   CSN: [id number] Arrival date & time: 07/04/23  2440  An emergency department physician performed an initial assessment on this suspected stroke patient at 33.  History  Chief Complaint  Patient presents with   Code Stroke    Aimee Hood is a 83 y.o. female.  83 year old female with past medical history of CVA in the past as well as CHF and hypertension presenting to the emergency department today with left-sided weakness.  Last known normal was apparent 11 PM last night.  The patient was brought to the emergency department this morning for further evaluation regarding this.  Her previous deficits from previous stroke have currently resolved.  The patient denies any other acute symptoms at this time.        Home Medications Prior to Admission medications   Medication Sig Start Date End Date Taking? Authorizing Provider  acetaminophen (TYLENOL) 500 MG tablet Take 1,000 mg by mouth every 6 (six) hours as needed for mild pain.   Yes [provider]  aspirin EC 81 MG tablet Take 1 tablet (81 mg total) by mouth daily. 09/15/16  Yes Janetta Hora, PA-C  cholecalciferol (VITAMIN D) 1000 UNITS tablet Take 2,000 Units by mouth daily.   Yes [provider]  clonazePAM (KLONOPIN) 0.5 MG tablet Take 1 tablet (0.5 mg total) by mouth 2 (two) times daily as needed (restless leg). 09/15/16  Yes Janetta Hora, PA-C  DULoxetine (CYMBALTA) 30 MG capsule Take 30 mg by mouth every morning. 01/21/18  Yes [provider]  DULoxetine (CYMBALTA) 60 MG capsule Take 60 mg by mouth at bedtime.   Yes [provider]  fenofibrate micronized (LOFIBRA) 134 MG capsule TAKE 1 CAPSULE (134 MG TOTAL) BY MOUTH DAILY BEFORE BREAKFAST. Patient taking differently: Take 134 mg by mouth at bedtime. 06/17/14  Yes Reather Littler, MD  furosemide (LASIX) 20 MG tablet Take 1 tablet (20 mg total) by mouth daily.  Please make overdue appt with Dr. Anne Fu before anymore refills. 3rd and Final Attempt 12/20/17  Yes Janetta Hora, PA-C  metoprolol succinate (TOPROL-XL) 50 MG 24 hr tablet Take 1 tablet (50 mg total) by mouth daily. Please make overdue appt with Dr. Anne Fu before anymore refills. 2nd attempt Patient taking differently: Take 50 mg by mouth at bedtime. Please make overdue appt with Dr. Anne Fu before anymore refills. 2nd attempt 04/03/18  Yes Jake Bathe, MD  nitroGLYCERIN (NITROSTAT) 0.4 MG SL tablet Place 0.4 mg under the tongue every 5 (five) minutes as needed for chest pain. X 3 doses   Yes [provider]  omeprazole (PRILOSEC) 20 MG capsule Take 20 mg by mouth daily.   Yes [provider]  ramipril (ALTACE) 10 MG capsule Take 1 capsule (10 mg total) by mouth daily. Please make overdue appt with Dr. Anne Fu before anymore refills. 1st attempt Patient taking differently: Take 20 mg by mouth daily. Please make overdue appt with Dr. Anne Fu before anymore refills. 1st attempt 11/09/17  Yes Strader, [place] M, PA-C  rosuvastatin (CRESTOR) 20 MG tablet Take 20 mg by mouth at bedtime.   Yes [provider]  traZODone (DESYREL) 50 MG tablet Take 50 mg by mouth at bedtime.   Yes [provider]  vitamin B-12 (CYANOCOBALAMIN) 1000 MCG tablet Take 1,000 mcg by mouth daily.   Yes [provider]  potassium chloride (K-DUR) 10 MEQ tablet TAKE 1 TAB BY MOUTH DAILY. PLEASE MAKE OVERDUE  APPT WITH DR. Anne Fu BEFORE ANYMORE REFILLS. Patient not taking: Reported on 07/04/2023 12/21/17   Jake Bathe, MD      Allergies    Nickel and Codeine    Review of Systems   Review of Systems  Neurological:  Positive for weakness.  All other systems reviewed and are negative.   Physical Exam Updated Vital Signs BP (!) 150/97   Pulse 62   Temp 97.8 F (36.6 C) (Oral)   Resp 17   SpO2 100%  Physical Exam Vitals and nursing note reviewed.   Gen: NAD Eyes:  PERRL, EOMI HEENT: no oropharyngeal swelling Neck: trachea midline Resp: clear to auscultation bilaterally Card: RRR, no murmurs, rubs, or gallops Abd: nontender, nondistended Extremities: no calf tenderness, no edema Vascular: 2+ radial pulses bilaterally, 2+ DP pulses bilaterally Neuro: Left-sided deficits with some pronator drift and left lower extremity drift noted.  Please see NIH stroke scale from stroke team who evaluated the patient on my initial evaluation as well. Skin: no rashes Psyc: acting appropriately   ED Results / Procedures / Treatments   Labs (all labs ordered are listed, but only abnormal results are displayed) Labs Reviewed  COMPREHENSIVE METABOLIC PANEL - Abnormal; Notable for the following components:      Result Value   Glucose, Bld 107 (*)    Creatinine, Ser 1.12 (*)    Total Protein 6.3 (*)    GFR, Estimated 50 (*)    All other components within normal limits  I-STAT CHEM 8, ED - Abnormal; Notable for the following components:   Creatinine, Ser 1.20 (*)    Glucose, Bld 103 (*)    All other components within normal limits  PROTIME-INR  APTT  CBC  DIFFERENTIAL  ETHANOL  LIPID PANEL  HEMOGLOBIN A1C  RAPID URINE DRUG SCREEN, HOSP PERFORMED  CBG MONITORING, ED    EKG EKG Interpretation Date/Time:  Monday July 04 2023 09:44:48 EDT Ventricular Rate:  70 PR Interval:  128 QRS Duration:  104 QT Interval:  427 QTC Calculation: 461 R Axis:   -19  Text Interpretation: Sinus rhythm Borderline left axis deviation Borderline T wave abnormalities Confirmed by Beckey Downing [phone]) on 07/04/2023 9:55:44 AM  Radiology MR BRAIN WO CONTRAST Result Date: 07/04/2023 CLINICAL DATA:  83 year old female code stroke presentation. Left side deficit. EXAM: MRI HEAD WITHOUT CONTRAST TECHNIQUE: Multiplanar, multiecho pulse sequences of the brain and surrounding structures were obtained without intravenous contrast. COMPARISON:  CT head, CTA and CTP today reported  separately. [facility] brain MRI 06/10/2018. FINDINGS: Brain: 11 mm wedge-shaped area of restricted diffusion in the posterior right corona radiata tracking toward the superior right insula (series 11, image 19). Subtle T2 and FLAIR hyperintensity associated. No other convincing diffusion restriction. Since 2020 progressed chronic encephalomalacia and gliosis in the posterior left MCA territory. Cortical and white matter involvement there. Small chronic infarct in the inferior left cerebellum is stable. Small chronic lacunar infarcts in the bilateral deep gray nuclei appear more numerous. No chronic cerebral blood products identified on SWI. No midline shift, mass effect, evidence of mass lesion, ventriculomegaly, extra-axial collection or acute intracranial hemorrhage. Cervicomedullary junction and pituitary are within normal limits. Vascular: Major intracranial vascular flow voids are stable since 2020. Skull and upper cervical spine: Negative. Visualized bone marrow signal is within normal limits. Sinuses/Orbits: Left sphenoid sinus disease redemonstrated. Mild mucosal thickening elsewhere. Postoperative changes to both globes. Other: Mastoids well aerated. Visible scalp and face appear within normal limits. IMPRESSION: 1. Positive for a  small 11 mm Acute Infarct in the posterior right corona radiata tracking toward the insula. No associated hemorrhage or mass effect. 2. Progressed chronic ischemic disease since a 2020 MRI, which is otherwise most pronounced in the posterior left MCA territory. Electronically Signed   By: Odessa Fleming M.D.   On: 07/04/2023 13:11   CT ANGIO HEAD NECK W WO CM W PERF (CODE STROKE) Result Date: 07/04/2023 CLINICAL DATA:  Provided history: Neuro deficit, acute, stroke suspected. Left arm weakness. EXAM: CT ANGIOGRAPHY HEAD AND NECK CT PERFUSION BRAIN TECHNIQUE: Multidetector CT imaging of the head and neck was performed using the standard protocol during bolus administration of  intravenous contrast. Multiplanar CT image reconstructions and MIPs were obtained to evaluate the vascular anatomy. Carotid stenosis measurements (when applicable) are obtained utilizing NASCET criteria, using the distal internal carotid diameter as the denominator. Multiphase CT imaging of the brain was performed following IV bolus contrast injection. Subsequent parametric perfusion maps were calculated using RAPID software. RADIATION DOSE REDUCTION: This exam was performed according to the departmental dose-optimization program which includes automated exposure control, adjustment of the mA and/or kV according to patient size and/or use of iterative reconstruction technique. CONTRAST:  OMNIPAQUE IOHEXOL 350 MG/ML SOLN COMPARISON:  Non-contrast head CT 07/04/2023. CT angiogram head 01/29/2013. FINDINGS: CTA NECK FINDINGS: Aortic arch: Standard aortic branching. Atherosclerotic plaque within the visualized aortic arch. Streak/beam hardening artifact arising from a dense contrast bolus partially obscures the left subclavian artery. Within this limitation, there is no appreciable hemodynamically significant innominate or proximal subclavian artery stenosis. Right carotid system: CCA and ICA patent within the neck without stenosis or significant atherosclerotic disease. Left carotid system: CCA and ICA patent within the neck without stenosis or significant atherosclerotic disease. Vertebral arteries: Venous reflux of contrast partially obscures the left vertebral artery V1 segment. Within this limitation, the vertebral arteries are codominant and patent within the neck without stenosis or significant atherosclerotic disease. Skeleton: No acute fracture or aggressive osseous lesion. Other neck: Subcentimeter thyroid nodules not meeting consensus criteria for ultrasound follow-up based on size. No follow-up imaging recommended. Reference: J Am Coll Radiol. 2015 Feb;12(2): 143-50. Upper chest: No consolidation  within the imaged lung apices. Review of the MIP images confirms the above findings CTA HEAD FINDINGS Anterior circulation: The intracranial internal carotid arteries are patent. Atherosclerotic plaque within both vessels with no more than mild stenosis. The M1 middle cerebral arteries are patent. No M2 proximal branch occlusion or high-grade proximal stenosis. The anterior cerebral arteries are patent. 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 Posterior circulation: The intracranial vertebral arteries are patent. Minimal non-stenotic atherosclerotic plaque within the right V4 segment. The basilar artery is patent. The posterior cerebral arteries are patent. Severe stenosis within the right PCA P2 segment (series 11, image 21). Severe stenosis within the right PCA P3 segment (series 11, image 21). Moderate stenosis within the left PCA at the P1/P2 junction (series 11, image 21). Posterior communicating arteries are diminutive or absent, bilaterally. Venous sinuses: Within the limitations of contrast timing, no convincing thrombus. Anatomic variants: As described. Review of the MIP images confirms the above findings CT Brain Perfusion Findings: CBF (<30%) Volume: 0mL Perfusion (Tmax>6.0s) volume: 0mL Mismatch Volume: 0mL Infarction Location:None identified. No emergent large vessel occlusion identified. These results were communicated to Dr. Iver Nestle At 9:59 amon 3/24/2025by text page via the [facility] messaging system. IMPRESSION: CTA neck: 1. The common carotid and internal carotid arteries are  patent within the neck without stenosis or significant atherosclerotic disease. 2. Venous reflux of contrast partially obscures the left vertebral artery V1 segment. Within this limitation, the vertebral arteries are patent within the neck without stenosis or significant atherosclerotic disease. 3. Aortic Atherosclerosis (ICD10-I70.0). CTA head: 1. No proximal  intracranial large vessel occlusion identified. 2. Intracranial atherosclerotic disease with multifocal stenoses, most notably as follows. 3. Severe stenosis within the right PCA P2 segment. 4. Severe stenosis within the right PCA P3 segment. 5. Moderate stenosis within the left PCA at the P1/P2 junction. 6. 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 CT perfusion head: The perfusion software identifies no core infarct. The perfusion software identifies no critically hypoperfused parenchyma (utilizing the Tmax>6 seconds threshold). No mismatch volume reported. Electronically Signed   By: Jackey Loge D.O.   On: 07/04/2023 09:59   CT HEAD CODE STROKE WO CONTRAST Result Date: 07/04/2023 CLINICAL DATA:  Code stroke. Provided history: Neuro deficit, acute, stroke suspected. Left arm/leg weakness. EXAM: CT HEAD WITHOUT CONTRAST TECHNIQUE: Contiguous axial images were obtained from the base of the skull through the vertex without intravenous contrast. RADIATION DOSE REDUCTION: This exam was performed according to the departmental dose-optimization program which includes automated exposure control, adjustment of the mA and/or kV according to patient size and/or use of iterative reconstruction technique. COMPARISON:  Brain MRI 03/10/2013. MRA head 03/10/2013. Head CT 03/09/2013. FINDINGS: Brain: Mild generalized cerebral atrophy. Chronic cortical/subcortical infarcts within the posterior left temporal, left frontal and left parietal lobes (MCA vascular territory). Patchy and ill-defined hypoattenuation elsewhere within the cerebral white matter, nonspecific but compatible with mild chronic small vessel ischemic disease. Small chronic infarcts within the left cerebellar hemisphere. There is no acute intracranial hemorrhage. No acute demarcated cortical infarct. No extra-axial fluid collection. No evidence of an intracranial mass. No midline shift. Vascular: No hyperdense vessel.   Atherosclerotic calcifications. Skull: No calvarial fracture or aggressive osseous lesion. Sinuses/Orbits: No mass or acute finding within the imaged orbits. Small-volume frothy secretions within the right sphenoid sinus. 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 Other: Nasal septal defect. ASPECTS [facility] Stroke Program Early CT Score) - Ganglionic level infarction (caudate, lentiform nuclei, internal capsule, insula, M1-M3 cortex): 7 - Supraganglionic infarction (M4-M6 cortex): 3 Total score (0-10 with 10 being normal): 10 (when discounting chronic left MCA territory infarct). No evidence of an acute intracranial abnormality. These results were communicated to Bhagat at 9:18 amon 3/24/2025by text page via the [facility] messaging system. IMPRESSION: 1. No evidence of an acute intracranial abnormality. 2. Parenchymal atrophy, chronic small vessel ischemic disease and chronic infarcts, as described. 3. Mild generalized cerebral atrophy. 4. Paranasal sinus disease as outlined. Correlate for signs/symptoms of acute sinusitis. 5. Nasal septal defect. Electronically Signed   By: Jackey Loge D.O.   On: 07/04/2023 09:19    Procedures Procedures    Medications Ordered in ED Medications  sodium chloride flush (NS) 0.9 % injection 3 mL (has no administration in time range)  aspirin chewable tablet 324 mg (has no administration in time range)  clopidogrel (PLAVIX) tablet 300 mg (has no administration in time range)  acetaminophen (TYLENOL) tablet 650 mg (has no administration in time range)    Or  acetaminophen (TYLENOL) suppository 650 mg (has no administration in time range)  aspirin EC tablet 81 mg (has no administration in time range)  cholecalciferol (VITAMIN D3) 25 MCG (1000 UNIT) tablet 2,000 Units (has no administration in time range)  clonazePAM (  KLONOPIN) tablet 0.5 mg (has no administration in time range)  DULoxetine (CYMBALTA) DR capsule 60 mg (has no administration in time  range)  fenofibrate tablet 160 mg (has no administration in time range)  pantoprazole (PROTONIX) EC tablet 40 mg (has no administration in time range)  rosuvastatin (CRESTOR) tablet 40 mg (has no administration in time range)  traZODone (DESYREL) tablet 50 mg (has no administration in time range)  cyanocobalamin (VITAMIN B12) tablet 1,000 mcg (has no administration in time range)  DULoxetine (CYMBALTA) DR capsule 30 mg (has no administration in time range)  clopidogrel (PLAVIX) tablet 75 mg (has no administration in time range)  iohexol (OMNIPAQUE) 350 MG/ML injection 100 mL (100 mLs Intravenous Contrast Given 07/04/23 0272)    ED Course/ Medical Decision Making/ A&P                                 Medical Decision Making 83 year old female with past medical history of CVA and hypertension presenting to the emergency department today with new left-sided deficits.  Patient's last known normal was 11 PM last night.  The patient was a code stroke and will be evaluated further with imaging.  Will discuss her case with neurology after her initial workup is complete.  She will likely require admission.  Amount and/or Complexity of Data Reviewed Labs: ordered. Radiology: ordered.  Risk OTC drugs. Prescription drug management. Decision regarding hospitalization.   CRITICAL CARE Performed by: Durwin Glaze   Total critical care time: 37 minutes  Critical care time was exclusive of separately billable procedures and treating other patients.  Critical care was necessary to treat or prevent imminent or life-threatening deterioration.  Critical care was time spent personally by me on the following activities: development of treatment plan with patient and/or surrogate as well as nursing, discussions with consultants, evaluation of patient's response to treatment, examination of patient, obtaining history from patient or surrogate, ordering and performing treatments and interventions, ordering  and review of laboratory studies, ordering and review of radiographic studies, pulse oximetry and re-evaluation of patient's condition.         Final Clinical Impression(s) / ED Diagnoses Final diagnoses:  Cerebrovascular accident (CVA), unspecified mechanism (HCC)    Rx / DC Orders ED Discharge Orders     None         Durwin Glaze, MD 07/04/23 [phone]

## 2023-07-04 NOTE — H&P (Signed)
 Date: 07/04/2023               Patient Name:  Aimee Hood MRN: [id number]  DOB: [birth year] Age / Sex: 83 y.o., female   PCP: Andreas Blower., MD         Medical Service: Internal Medicine Teaching Service         Attending Physician: Dr. Reymundo Poll, MD      First Contact: Dr. Jeral Pinch, DO Pager [phone]    Second Contact: Dr. Modena Slater, DO Pager [phone]         After Hours (After 5p/  First Contact Pager: [phone]  weekends / holidays): Second Contact Pager: [phone]   SUBJECTIVE   Chief Complaint: Lower extremity weakness   History of Present Illness:   This is a 83 year old female with past medical history of hypertension, hyperlipidemia, GAD, GERD, obesity, vitamin B12 deficiency, vitamin D deficiency, recurrent epistaxis, remote left MCA distribution cortical stroke in 2014 wo residual deficit presents to ED for new bilateral lower extremity weakness admitted for stroke work up.   Patient reports her symptoms initially started two days ago, worsen last night. Reports that she was going to bathroom multiple times yesterday and noticed that she had LE weakness. Reported that last night at 2300, she had to hold against objects to ambulate and go to restroom. She felt like her legs were to give out. This morning her son, found her in the kitchen, brought her a walker to ambulate. No speech deficits. Son denies any facial droop. She does endorse some tingling sensation of her left upper extremity this morning, which has resolved currently.  She denies any symptoms of dizziness, lightheadedness, chest pain, shortness of breath, nausea, vomiting, abdominal pain, numbness or tingling of bilateral lower feet. Reports that she had pneumonia since the beginning of January to most of February. Presently denies any cough, congestion and sore throat.   ED Course: Code stroke Neurology on board   Past Medical History: Past Medical History:  Diagnosis Date   Allergy     Arthritis    "all over" (07/10/2013)   CAD (coronary artery disease)    a. normal cors by cath in 2016  b. 08/2016: admitted with an NSTEMI. Cath showed mild nonobstructive CAD with only 20% mid-LAD stenosis. EF was reduced to 35% and wall motion abnormalities were suggestive of a stress-induced cardiomyopathy.   Carpal tunnel syndrome of left wrist    Cerebral aneurysm, nonruptured    3-4 mm in size, stable since 2004 (most recent MRI 2014)   Colon polyp 2007   Diverticulitis    Family history of anesthesia complication    "brother had PONV"   Gastroesophageal reflux disease    High cholesterol    Hypertension    Insomnia    Macular degeneration 08/31/2016   PONV (postoperative nausea and vomiting)    Restless leg syndrome    Stroke [facility]) 02/2013   denies residual on 07/10/2013)    Meds:   Tylenol 500 mg every 6 hours as needed Aspirin 81 mg daily Vitamin D 2000 units daily Klonopin 0.5 mg twice daily Cymbalta 60 mg daily, 30 Fenofibrate 134 mg daily Lasix 20 mg daily Metoprolol succinate 50 mg once daily Omeprazole 20 mg daily Nitroglycerin sublingual tablet Potassium chloride 10 mill equivalents Ramipril 10 mg daily Crestor 20 mg daily Vitamin B12 1000 mcg daily Ocuvite Eye vitamins  Past Surgical History:  Procedure Laterality Date   BREAST SURGERY     CHOLECYSTECTOMY  JOINT REPLACEMENT     KNEE ARTHROSCOPY Bilateral    LEFT HEART CATH AND CORONARY ANGIOGRAPHY N/A 09/01/2016   Procedure: Left Heart Cath and Coronary Angiography;  Surgeon: Iran Ouch, MD;  Location: [facility] INVASIVE CV LAB;  Service: Cardiovascular;  Laterality: N/A;   LEFT HEART CATHETERIZATION WITH CORONARY ANGIOGRAM N/A 06/20/2014   Procedure: LEFT HEART CATHETERIZATION WITH CORONARY ANGIOGRAM;  Surgeon: Jake Bathe, MD;  Location: [facility] CATH LAB;  Service: Cardiovascular;  Laterality: N/A;   TEE WITHOUT CARDIOVERSION N/A 03/14/2013   Procedure: TRANSESOPHAGEAL ECHOCARDIOGRAM (TEE);  Surgeon:  Donato Schultz, MD;  Location: [facility] ENDOSCOPY;  Service: Cardiovascular;  Laterality: N/A;   TOTAL KNEE ARTHROPLASTY Left 07/10/2013   TOTAL KNEE ARTHROPLASTY Left 07/10/2013   Procedure: TOTAL KNEE ARTHROPLASTY;  Surgeon: Valeria Batman, MD;  Location: [facility] OR;  Service: Orthopedics;  Laterality: Left;   VAGINAL HYSTERECTOMY  2000's    Social:  Lives with: Pura Spice with son, Minerva Areola Occupation: Hairdresser in the past  Support: Good support with family  Level of function: Independent in All ADLs and iADLs, walks without a walker, drivers  Substance use: No substance use  PCP: Andreas Blower., MD  Family History: Lung cancer in both father and brother   Allergies: Allergies as of 07/04/2023 - Review Complete 07/04/2023  Allergen Reaction Noted   Nickel  06/13/2014   Codeine Rash 11/13/2012    Review of Systems: A complete ROS was negative except as per HPI.   OBJECTIVE:   Physical Exam: Blood pressure (!) 150/97, pulse 62, temperature 97.8 F (36.6 C), temperature source Oral, resp. rate 17, SpO2 100%.   Constitutional: Appears well, laying in bed, answering questions appropriately, no acute distress,  HENT: normocephalic atraumatic, mucous membranes moist Eyes: PERRLA Cardiovascular: regular rate and rhythm, no m/r/g Pulmonary/Chest: normal work of breathing on room air, lungs clear to auscultation bilaterally Abdominal: soft, non-tender, non-distended, bowel sounds are present  Neurological: Alert and oriented to self, time and event. PERRLA, EOMI, CN II to XII intact. 5/5 grip strength. 5/5 strength bilateral UE. LLE 3/5 and RLE 4/5  Skin: warm and dry Psych: Pleasant   Labs: CBC    Component Value Date/Time   WBC 5.3 07/04/2023 0858   RBC 4.22 07/04/2023 0858   HGB 13.3 07/04/2023 0900   HGB 13.1 09/16/2016 1210   HCT 39.0 07/04/2023 0900   HCT 40.2 09/16/2016 1210   PLT 279 07/04/2023 0858   PLT 324 09/16/2016 1210   MCV 91.9 07/04/2023 0858   MCV 89 09/16/2016  1210   MCH 28.9 07/04/2023 0858   MCHC 31.4 07/04/2023 0858   RDW 12.9 07/04/2023 0858   RDW 13.4 09/16/2016 1210   LYMPHSABS 1.3 07/04/2023 0858   MONOABS 0.6 07/04/2023 0858   EOSABS 0.1 07/04/2023 0858   BASOSABS 0.0 07/04/2023 0858     CMP     Component Value Date/Time   NA 140 07/04/2023 0900   NA 142 09/27/2016 1008   K 4.0 07/04/2023 0900   CL 104 07/04/2023 0900   CO2 24 07/04/2023 0858   GLUCOSE 103 (H) 07/04/2023 0900   BUN 20 07/04/2023 0900   BUN 21 09/27/2016 1008   CREATININE 1.20 (H) 07/04/2023 0900   CALCIUM 9.5 07/04/2023 0858   PROT 6.3 (L) 07/04/2023 0858   ALBUMIN 3.6 07/04/2023 0858   AST 19 07/04/2023 0858   ALT 11 07/04/2023 0858   ALKPHOS 55 07/04/2023 0858   BILITOT 0.9 07/04/2023 0858   GFRNONAA 50 (  L) 07/04/2023 0858   GFRAA 55 (L) 09/27/2016 1008    Imaging: CT head code stroke without contrast IMPRESSION: 1. No evidence of an acute intracranial abnormality. 2. Parenchymal atrophy, chronic small vessel ischemic disease and chronic infarcts, as described. 3. Mild generalized cerebral atrophy. 4. Paranasal sinus disease as outlined. Correlate for signs/symptoms of acute sinusitis. 5. Nasal septal defect.  CT Angio Head and neck with and without contrast IMPRESSION: CTA neck:   1. The common carotid and internal carotid arteries are patent within the neck without stenosis or significant atherosclerotic disease. 2. Venous reflux of contrast partially obscures the left vertebral artery V1 segment. Within this limitation, the vertebral arteries are patent within the neck without stenosis or significant atherosclerotic disease. 3. Aortic Atherosclerosis (ICD10-I70.0).   CTA head:   1. No proximal intracranial large vessel occlusion identified. 2. Intracranial atherosclerotic disease with multifocal stenoses, most notably as follows. 3. Severe stenosis within the right PCA P2 segment. 4. Severe stenosis within the right PCA P3  segment. 5. Moderate stenosis within the left PCA at the P1/P2 junction. 6. 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   MR Brain IMPRESSION: 1. Positive for a small 11 mm Acute Infarct in the posterior right corona radiata tracking toward the insula. No associated hemorrhage or mass effect. 2. Progressed chronic ischemic disease since a 2020 MRI, which is otherwise most pronounced in the posterior left MCA territory.  EKG: personally reviewed my interpretation is NSR and rate, QTC 461.  ASSESSMENT & PLAN:   Assessment & Plan by Problem: Principal Problem:   Left-sided weakness Active Problems:   Essential hypertension   GERD (gastroesophageal reflux disease)   HLD (hyperlipidemia)   GAD (generalized anxiety disorder)   Aimee Hood is a 83 y.o. person living with a history of hypertension, hyperlipidemia, GAD, GERD, obesity, vitamin B12 deficiency, vitamin D deficiency, recurrent epistaxis, remote left MCA distribution cortical stroke in 2014 who presented with new left sided weakness and admitted for Stroke workup on hospital day 0  Acute infarct Posterior R corona radiata  Presented with new left sided weakness, no speech deficits. LKN 2100 last night. Code stroke called in the ED. MR brain notable for small 11 mm acute infarct in the posterior right corona radiata tracking toward the insula, no hemorrhage.  Progression of the chronic ischemic disease. CTA H/N negative for intracranial LVO, findings did show stenosis of multiple segments of PCA. Has unchanged L ACA pericallosal aneurysm. Will need thorough work up for etiology, but most likely atherosclerotic as seen with significant vessel stenosis.  - Give ASA 325 mg and Plavix 300 mg load via PO - Then ASA 81 mg PO daily and Plavix 75 mg daily starting tomorrow  - Follow up with Hgb A1c, fasting lipid panel, coag studies  - Follow up ECHO complete with bubble study - PT/OT/SLP -  Frequent neuro checks  HTN Hold home medications Lasix 20 mg daily, metoprolol succinate 2 mg daily, ramipril 10 mg daily.   - Allow permissive hypertension.  HLD -Increase home medication Crestor 20 mg to 40 mg daily -Home medication fenofibrate 160 mg daily  GAD -Continue home medication Klonopin 0.5 mg twice daily, prn -Continue Cymbalta 30 mg in the morning and 60 mg at bedtime  GERD -Continue home medication Protonix 40 mg daily  History of vitamin D deficiency Hx of B12 deficiency -Continue home medications vitamin D3 2000 u daily -Continue home medication B12 1000 mcg  daily   Insomnia -Continue home medication trazodone 50 mg daily at bedtime  Diet: Regular  VTE: SCDs IVF: None,None Code: DNR/DNI  Prior to Admission Living Arrangement: Home, living son  Anticipated Discharge Location: Home Barriers to Discharge: medical management  Dispo: Admit patient to Observation with expected length of stay less than 2 midnights.  Signed: Jeral Pinch, DO Internal Medicine Resident PGY-1  07/04/2023, 2:32 PM

## 2023-07-04 NOTE — ED Triage Notes (Signed)
 Pt BIB [facility] from home for L sided weakness. Code stroke activated 0837. 2100 March 23 LKW, pt noticed weakness 2300 when she woke up but went back to sleep. Pt has L arm and L leg weakness with mild aphasia. VSS per EMS.

## 2023-07-04 NOTE — Consult Note (Signed)
 NEUROLOGY CONSULT NOTE   Date of service: July 04, 2023 Patient Name: Aimee Hood MRN:  [id number] DOB:  Oct 12, 1940 Chief Complaint: "Left-sided weakness" Requesting Provider: Durwin Glaze, MD  History of Present Illness  Aimee Hood is a 83 y.o. right-handed female with hx of remote left MCA distribution cortical stroke presenting with aphasia s/p tPA in 2014 without residual deficit, essential hypertension, hyperlipidemia, NSTEMI in 2018, diverticulitis, GERD, OA, and RLS who presented to the ED this morning via EMS for evaluation of left-sided weakness.  Patient states that she was in her usual state of health last night 07/03/23 when she went to bed.  She states she woke up to use the restroom at 23:00 and felt weak in her legs with ambulation.  She went back to sleep and when she woke up this morning, she continued to have lower extremity weakness and on EMS arrival, her left arm was also noted to be weaker than the right and a Code Stroke was subsequently activated.   LKW: 07/03/23 at 21:00 Modified rankin score: 0-Completely asymptomatic and back to baseline post- stroke IV Thrombolysis: No, patient presented outside of the thrombolytic therapy time window EVT: No, no LVO on vessel imaging   NIHSS components Score: Comment  1a Level of Conscious 0[x]  1[]  2[]  3[]      1b LOC Questions 0[x]  1[]  2[]       1c LOC Commands 0[x]  1[]  2[]       2 Best Gaze 0[x]  1[]  2[]       3 Visual 0[x]  1[]  2[]  3[]      4 Facial Palsy 0[x]  1[]  2[]  3[]      5a Motor Arm - left 0[]  1[x]  2[]  3[]  4[]  UN[]    5b Motor Arm - Right 0[x]  1[]  2[]  3[]  4[]  UN[]    6a Motor Leg - Left 0[]  1[]  2[x]  3[]  4[]  UN[]    6b Motor Leg - Right 0[x]  1[]  2[]  3[]  4[]  UN[]    7 Limb Ataxia 0[x]  1[]  2[]  3[]  UN[]     8 Sensory 0[x]  1[]  2[]  UN[]      9 Best Language 0[]  1[x]  2[]  3[]      10 Dysarthria 0[x]  1[]  2[]  UN[]      11 Extinct. and Inattention 0[]  1[x]  2[]      Subtle at best, intermittent  TOTAL:  5    ROS  Comprehensive ROS  performed and pertinent positives documented in HPI   Past History   Past Medical History:  Diagnosis Date   Allergy    Arthritis    "all over" (07/10/2013)   CAD (coronary artery disease)    a. normal cors by cath in 2016  b. 08/2016: admitted with an NSTEMI. Cath showed mild nonobstructive CAD with only 20% mid-LAD stenosis. EF was reduced to 35% and wall motion abnormalities were suggestive of a stress-induced cardiomyopathy.   Carpal tunnel syndrome of left wrist    Cerebral aneurysm, nonruptured    3-4 mm in size, stable since 2004 (most recent MRI 2014)   Colon polyp 2007   Diverticulitis    Family history of anesthesia complication    "brother had PONV"   Gastroesophageal reflux disease    High cholesterol    Hypertension    Insomnia    Macular degeneration 08/31/2016   PONV (postoperative nausea and vomiting)    Restless leg syndrome    Stroke [facility]) 02/2013   denies residual on 07/10/2013)   Past Surgical History:  Procedure Laterality Date   BREAST SURGERY  CHOLECYSTECTOMY     JOINT REPLACEMENT     KNEE ARTHROSCOPY Bilateral    LEFT HEART CATH AND CORONARY ANGIOGRAPHY N/A 09/01/2016   Procedure: Left Heart Cath and Coronary Angiography;  Surgeon: Iran Ouch, MD;  Location: [facility] INVASIVE CV LAB;  Service: Cardiovascular;  Laterality: N/A;   LEFT HEART CATHETERIZATION WITH CORONARY ANGIOGRAM N/A 06/20/2014   Procedure: LEFT HEART CATHETERIZATION WITH CORONARY ANGIOGRAM;  Surgeon: Jake Bathe, MD;  Location: [facility] CATH LAB;  Service: Cardiovascular;  Laterality: N/A;   TEE WITHOUT CARDIOVERSION N/A 03/14/2013   Procedure: TRANSESOPHAGEAL ECHOCARDIOGRAM (TEE);  Surgeon: Donato Schultz, MD;  Location: [facility] ENDOSCOPY;  Service: Cardiovascular;  Laterality: N/A;   TOTAL KNEE ARTHROPLASTY Left 07/10/2013   TOTAL KNEE ARTHROPLASTY Left 07/10/2013   Procedure: TOTAL KNEE ARTHROPLASTY;  Surgeon: Valeria Batman, MD;  Location: [facility] OR;  Service: Orthopedics;  Laterality: Left;    VAGINAL HYSTERECTOMY  2000's   Family History: Family History  Problem Relation Age of Onset   Heart disease Brother 51   Cancer Brother        lung   Cancer Father        lung   Hypertension Father    Diabetes Paternal Aunt    Cancer Maternal Grandmother    Stroke Neg Hx    Social History  reports that she has never smoked. She has never used smokeless tobacco. She reports that she does not drink alcohol and does not use drugs.  Allergies  Allergen Reactions   Nickel     Discovered allergy post knee surgery   Codeine Rash   Medications   Current Facility-Administered Medications:    sodium chloride flush (NS) 0.9 % injection 3 mL, 3 mL, Intravenous, Once, Durwin Glaze, MD  Current Outpatient Medications:    acetaminophen (TYLENOL) 500 MG tablet, Take 1,000 mg by mouth every 6 (six) hours as needed for mild pain., Disp: , Rfl:    aspirin EC 81 MG tablet, Take 1 tablet (81 mg total) by mouth daily., Disp: 90 tablet, Rfl: 3   cholecalciferol (VITAMIN D) 1000 UNITS tablet, Take 2,000 Units by mouth daily., Disp: , Rfl:    clonazePAM (KLONOPIN) 0.5 MG tablet, Take 1 tablet (0.5 mg total) by mouth 2 (two) times daily as needed (restless leg)., Disp: 30 tablet, Rfl: 0   DULoxetine (CYMBALTA) 30 MG capsule, Take by mouth., Disp: , Rfl:    DULoxetine (CYMBALTA) 60 MG capsule, Take 60 mg by mouth daily., Disp: , Rfl:    fenofibrate micronized (LOFIBRA) 134 MG capsule, TAKE 1 CAPSULE (134 MG TOTAL) BY MOUTH DAILY BEFORE BREAKFAST., Disp: 90 capsule, Rfl: 1   furosemide (LASIX) 20 MG tablet, Take 1 tablet (20 mg total) by mouth daily. Please make overdue appt with Dr. Anne Fu before anymore refills. 3rd and Final Attempt, Disp: 15 tablet, Rfl: 0   meclizine (ANTIVERT) 25 MG tablet, TAKE 1/2 TABLET BY MOUTH TWICE A DAY AS NEEDED FOR UP TO 5 DAYS FOR DIZZINESS, Disp: , Rfl:    metoprolol succinate (TOPROL-XL) 50 MG 24 hr tablet, Take 1 tablet (50 mg total) by mouth daily. Please make  overdue appt with Dr. Anne Fu before anymore refills. 2nd attempt, Disp: 15 tablet, Rfl: 0   nitroGLYCERIN (NITROSTAT) 0.4 MG SL tablet, Place 0.4 mg under the tongue every 5 (five) minutes as needed for chest pain. X 3 doses, Disp: , Rfl:    omeprazole (PRILOSEC) 20 MG capsule, Take 20 mg by mouth daily., Disp: ,  Rfl:    potassium chloride (K-DUR) 10 MEQ tablet, TAKE 1 TAB BY MOUTH DAILY. PLEASE MAKE OVERDUE APPT WITH DR. Anne Fu BEFORE ANYMORE REFILLS., Disp: 15 tablet, Rfl: 0   ramipril (ALTACE) 10 MG capsule, Take 1 capsule (10 mg total) by mouth daily. Please make overdue appt with Dr. Anne Fu before anymore refills. 1st attempt, Disp: 30 capsule, Rfl: 0   rosuvastatin (CRESTOR) 20 MG tablet, Take 20 mg by mouth daily., Disp: , Rfl:    vitamin B-12 (CYANOCOBALAMIN) 1000 MCG tablet, Take by mouth., Disp: , Rfl:   Vitals   Vitals:   07/16/23 0912 2023-07-16 0945 07-16-23 0953 July 16, 2023 1000  BP: (!) 161/73 (!) 163/103  (!) 164/65  Pulse:  69  (!) 59  Resp:  20  17  Temp:   97.8 F (36.6 C)   TempSrc:   Oral   SpO2:  99%  98%    There is no height or weight on file to calculate BMI.  Physical Exam   Constitutional: Appears well-developed and well-nourished.  Psych: Flat affect, calm and cooperative throughout exam Eyes: No scleral injection.  HENT: No OP obstruction.  Head: Normocephalic.  Cardiovascular: Normal rate and regular rhythm on telemetry Respiratory: Effort normal, non-labored breathing on room air GI: Soft.  No distension. There is no tenderness.  Skin: WDI.   Neurologic Examination   Mental Status: Patient is awake, alert, oriented to person, place, month, year, and situation. Patient is able to give a clear and coherent history. With naming objects, patient occasionally has subtle word finding difficulties.  Patient states that she does feel she has some intermittent trouble with words today without difficulties at baseline.   Cranial Nerves: II: Visual Fields are  full with some inconsistent visual neglect with simultaneous double visual field testing. Pupils are equal, round, and reactive to light.   III,IV, VI: EOMI without ptosis or diploplia.  V: Facial sensation is intact and symmetric to light touch VII: Facial movement is symmetric  VIII: Hearing is intact to voice X: Palate elevates symmetrically XI: Shoulder shrug is symmetric. XII: Tongue protrudes midline  Motor: Left upper extremity has a mild pronator drift with sustained antigravity positioning. Left lower extremity has varying degrees of drift throughout assessment with drift to bed at times and minimal drift at other times during assessment.  Right upper and lower extremities elevate antigravity without vertical drift. (Occasionally needs coaching/encouragement for right lower extremity to elevate higher than minimal elevation) Sensory: Sensation is symmetric to light touch and temperature in the arms and legs. No extinction to DSS present.  Plantars: Toes are downgoing bilaterally.  Cerebellar: FNF intact bilaterally, HKS slow on the right but without ataxia, patient with limited assessment of HKS on the left due to lower extremity weakness.  Within weakness limitation, there is no overt dysmetria.   Labs/Imaging/Neurodiagnostic studies   CBC:  Recent Labs  Lab 16-Jul-2023 0858 07/16/2023 0900  WBC 5.3  --   NEUTROABS 3.2  --   HGB 12.2 13.3  HCT 38.8 39.0  MCV 91.9  --   PLT 279  --    Basic Metabolic Panel:  Lab Results  Component Value Date   NA 140 07-16-23   K 4.0 07/16/23   CO2 24 2023/07/16   GLUCOSE 103 (H) 07/16/2023   BUN 20 July 16, 2023   CREATININE 1.20 (H) Jul 16, 2023   CALCIUM 9.5 2023-07-16   GFRNONAA 50 (L) 07/16/23   GFRAA 55 (L) 09/27/2016    Alcohol Level  Component Value Date/Time   ETH <10 07/04/2023 0858   INR  Lab Results  Component Value Date   INR 1.0 07/04/2023   APTT  Lab Results  Component Value Date   APTT 30 07/04/2023      CT Head without contrast(Personally reviewed): 1. No evidence of an acute intracranial abnormality. 2. Parenchymal atrophy, chronic small vessel ischemic disease and chronic infarcts, as described. 3. Mild generalized cerebral atrophy. 4. Paranasal sinus disease as outlined. Correlate for signs/symptoms of acute sinusitis. 5. Nasal septal defect.  CT angio Head and Neck with contrast + CT cerebral perfusion (Personally reviewed): CTA neck: 1. The common carotid and internal carotid arteries are patent within the neck without stenosis or significant atherosclerotic disease. 2. Venous reflux of contrast partially obscures the left vertebral artery V1 segment. Within this limitation, the vertebral arteries are patent within the neck without stenosis or significant atherosclerotic disease. 3. Aortic Atherosclerosis (ICD10-I70.0).   CTA head: 1. No proximal intracranial large vessel occlusion identified. 2. Intracranial atherosclerotic disease with multifocal stenoses, most notably as follows. 3. Severe stenosis within the right PCA P2 segment. 4. Severe stenosis within the right PCA P3 segment. 5. Moderate stenosis within the left PCA at the P1/P2 junction. 6. 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   CT perfusion head: The perfusion software identifies no core infarct. The perfusion software identifies no critically hypoperfused parenchyma (utilizing the Tmax>6 seconds threshold). No mismatch volume reported.  ASSESSMENT   Aimee Hood is a 83 y.o. female with PMHx of left MCA cortical CVA s/p TPA in 2014 without residual deficit, RLS, GERD, HTN, and HLD who presented to the ED 07/04/23 via EMS for evaluation of left-sided weakness with onset after going to bed at 21:00 last night.  She first noted the weakness when she had trouble walking to the bathroom at 23:00 last night and the weakness persisted when she woke up this morning  prompting further evaluation.   - On evaluation examination reveals patient with left upper and lower extremity weakness, subtle word finding difficulties, questionable and inconsistent visual neglect with NIHSS of 5. - CT head is without acute intracranial abnormality though reveals chronic infarcts.  CT angio imaging reveals no LVO but intracranial atherosclerotic disease and multifocal stenoses with severe stenosis right P2/P3 PCA, moderate stenosis left PCA at P1/P2 junction. - Presentation is most consistent with an acute ischemic stroke with left-sided deficits.  Not a candidate for TNK as last known well was last night at 21:00 with symptoms being present since at least 23:00 last night.  Not a candidate for IR due to no LVO. Will complete stroke work up for further evaluation.  - Remote CVA in 2014 concerning for possible embolic etiology with L MCA cortical infarcts though no source was identified during previous work up. Very small PFO identified on TEE outpatient, no evidence of atrial fibrillation, patient takes prophylactic aspirin daily.  Last followed with outpatient neurology in January of 2015- Dr. Anne Hahn.   RECOMMENDATIONS  - HgbA1c, fasting lipid panel - MRI of the brain without contrast - Frequent neuro checks - Echocardiogram - Prophylactic therapy- Antiplatelet med: Aspirin - continue ASA 81 mg PO daily home dose.  - Add clopidogrel 300 mg PO once followed by 75 mg PO daily, duration per stroke team pending full workup - Risk factor modification - Telemetry monitoring - PT consult, OT consult, Speech consult - Stroke team to follow _________________________________________________________________  Kathrene Bongo  Rogelia Mire, NP [facility]  Attending Neurologist's note:  I personally saw this patient, gathering history, performing a full neurologic examination, reviewing relevant labs, personally reviewing relevant imaging including head CT and CTA head and  neck, and formulated the assessment and plan, adding the note above for completeness and clarity to accurately reflect my thoughts  Brooke Dare MD-PhD [facility] [phone]  CRITICAL CARE Performed by: Gordy Councilman   Total critical care time: 30 minutes  Critical care time was exclusive of separately billable procedures and treating other patients.  Critical care was necessary to treat or prevent imminent or life-threatening deterioration -- emergent evaluation for consideration of thrombolytic or thrombectomy  Critical care was time spent personally by me on the following activities: development of treatment plan with patient and/or surrogate as well as nursing, discussions with consultants, evaluation of patient's response to treatment, examination of patient, obtaining history from patient or surrogate, ordering and performing treatments and interventions, ordering and review of laboratory studies, ordering and review of radiographic studies, pulse oximetry and re-evaluation of patient's condition.

## 2023-07-04 NOTE — Evaluation (Signed)
 Occupational Therapy Evaluation Patient Details Name: Aimee Hood MRN: [id number] DOB: 07-Nov-1940 Today's Date: 07/04/2023   History of Present Illness   83 y.o. right-handed female adm 3/24 with hx of remote left MCA distribution cortical stroke presenting with aphasia s/p tPA in 2014 without residual deficit, essential hypertension, hyperlipidemia, NSTEMI in 2018, diverticulitis, GERD, OA, and RLS who presented to the ED this morning via EMS for evaluation of left-sided weakness.  MR Brain:Positive for a small 11 mm Acute Infarct in the posterior right  corona radiata tracking toward the insula     Clinical Impressions Patient admitted for the diagnosis above.  PTA she lives at home with her son, who assists with community mobility only, although she still drives.  Currently she presents with L sided weakness, more to L lower extremity, balance deficits and slowed processing.  Patient is needing CGA to Supervision at [facility] level for ADL and mobility.  She and her son  would like to return home with [facility] services.  OT will continue efforts in the acute setting to address deficits.       If plan is discharge home, recommend the following:   Assist for transportation;Assistance with cooking/housework     Functional Status Assessment   Patient has had a recent decline in their functional status and demonstrates the ability to make significant improvements in function in a reasonable and predictable amount of time.     Equipment Recommendations   None recommended by OT     Recommendations for Other Services         Precautions/Restrictions   Precautions Precautions: Fall Restrictions Weight Bearing Restrictions Per Provider Order: No     Mobility Bed Mobility Overal bed mobility: Needs Assistance Bed Mobility: Supine to Sit, Sit to Supine     Supine to sit: Supervision Sit to supine: Supervision        Transfers Overall transfer level: Needs assistance Equipment  used: Rolling walker (2 wheels) Transfers: Sit to/from Stand, Bed to chair/wheelchair/BSC Sit to Stand: Supervision     Step pivot transfers: Contact guard assist            Balance Overall balance assessment: Needs assistance Sitting-balance support: Feet supported Sitting balance-Leahy Scale: Good     Standing balance support: Reliant on assistive device for balance Standing balance-Leahy Scale: Fair                             ADL either performed or assessed with clinical judgement   ADL       Grooming: Wash/dry hands;Set up;Sitting               Lower Body Dressing: Contact guard assist;Minimal assistance;Sit to/from stand   Toilet Transfer: Ambulance person;Ambulation;Rolling walker (2 wheels)                   Vision Baseline Vision/History: 1 Wears glasses Patient Visual Report: No change from baseline       Perception Perception: Within Functional Limits       Praxis Praxis: WFL       Pertinent Vitals/Pain Pain Assessment Pain Assessment: Faces Faces Pain Scale: Hurts little more Pain Location: L ankle tenderness Pain Descriptors / Indicators: Sharp, Grimacing Pain Intervention(s): Monitored during session     Extremity/Trunk Assessment Upper Extremity Assessment Upper Extremity Assessment: Right hand dominant;LUE deficits/detail LUE Deficits / Details: grossly 3+/5 MMT LUE Sensation: WNL LUE Coordination: decreased fine motor;decreased gross  motor   Lower Extremity Assessment Lower Extremity Assessment: Defer to PT evaluation   Cervical / Trunk Assessment Cervical / Trunk Assessment: Kyphotic   Communication Communication Communication: No apparent difficulties   Cognition Arousal: Alert Behavior During Therapy: Flat affect               OT - Cognition Comments: Patient defers to son for home setup, but following commands well and nothing obvious with memory.                  Following commands: Intact       Cueing  General Comments   Cueing Techniques: Verbal cues   VSS on RA   Exercises     Shoulder Instructions      Home Living Family/patient expects to be discharged to:: Private residence Living Arrangements: Children Available Help at Discharge: Family;Available PRN/intermittently Type of Home: House Home Access: Ramped entrance     Home Layout: Two level Alternate Level Stairs-Number of Steps: flight to upstairs storage, patient does not go up the stairs. Alternate Level Stairs-Rails: Right;Left;Can reach both Bathroom Shower/Tub: Producer, television/film/video: Standard Bathroom Accessibility: Yes How Accessible: Accessible via walker Home Equipment: Rolling Walker (2 wheels);Rollator (4 wheels);Wheelchair - manual;Shower seat - built in;BSC/3in1   Additional Comments: DME from spouse who recently passed.      Prior Functioning/Environment Prior Level of Function : Independent/Modified Independent;Driving                    OT Problem List: Decreased strength;Impaired balance (sitting and/or standing)   OT Treatment/Interventions: Self-care/ADL training;Therapeutic activities;Patient/family education;Balance training;DME and/or AE instruction      OT Goals(Current goals can be found in the care plan section)   Acute Rehab OT Goals Patient Stated Goal: Return home OT Goal Formulation: With patient Time For Goal Achievement: 07/18/23 Potential to Achieve Goals: Good ADL Goals Pt Will Perform Grooming: with modified independence;standing Pt Will Perform Lower Body Dressing: with modified independence;sit to/from stand Pt Will Transfer to Toilet: with modified independence;ambulating;regular height toilet   OT Frequency:  Min 1X/week    Co-evaluation              AM-PAC OT "6 Clicks" Daily Activity     Outcome Measure Help from another person eating meals?: None Help from another person taking care of  personal grooming?: A Little Help from another person toileting, which includes using toliet, bedpan, or urinal?: A Little Help from another person bathing (including washing, rinsing, drying)?: A Little Help from another person to put on and taking off regular upper body clothing?: None Help from another person to put on and taking off regular lower body clothing?: A Little 6 Click Score: 20   End of Session Equipment Utilized During Treatment: Gait belt;Rolling walker (2 wheels) Nurse Communication: Mobility status  Activity Tolerance: Patient tolerated treatment well Patient left: in bed;with call bell/phone within reach;with family/visitor present  OT Visit Diagnosis: Unsteadiness on feet (R26.81);Muscle weakness (generalized) (M62.81)                Time: 1438-1500 OT Time Calculation (min): 22 min Charges:  OT General Charges $OT Visit: 1 Visit OT Evaluation $OT Eval Moderate Complexity: 1 Mod  07/04/2023  RP, OTR/L  Acute Rehabilitation Services  Office:  [phone]   Suzanna Obey 07/04/2023, 3:21 PM

## 2023-07-04 NOTE — Code Documentation (Signed)
 Stroke Response Nurse Documentation Code Documentation  Aimee Hood is a 83 y.o. female arriving to [facility]  via [facility] on 07/04/2023 with past medical hx of prior Lt MCA stroke with resolved deficits. On aspirin 81 mg daily. Code stroke was activated by EMS.   Patient from home where she was LKW at 2300 last night and now complaining of left sided weakness.  Stroke team at the bedside on patient arrival. Labs drawn and patient cleared for CT by EDP. Patient to CT with team. NIHSS 3, see documentation for details and code stroke times. Patient with left facial droop, left arm weakness, and left leg weakness on exam. The following imaging was completed:  CT Head, CTA, and CTP. Patient is not a candidate for IV Thrombolytic due to outside the treatment window. Patient is not a candidate for IR due to LVO negative.   Care Plan: NIHSS and VS q 2 x 12 hr, then q 4                   NPO until stroke swallow screen                   SBP < 220  Process Delays Noted: N/A  Bedside handoff with ED RN complete.   Argentina Donovan  Stroke Response RN

## 2023-07-05 ENCOUNTER — Other Ambulatory Visit: Payer: Self-pay | Admitting: Cardiology

## 2023-07-05 ENCOUNTER — Encounter: Payer: Self-pay | Admitting: *Deleted

## 2023-07-05 ENCOUNTER — Other Ambulatory Visit (HOSPITAL_COMMUNITY): Payer: Self-pay

## 2023-07-05 ENCOUNTER — Observation Stay (HOSPITAL_BASED_OUTPATIENT_CLINIC_OR_DEPARTMENT_OTHER)

## 2023-07-05 DIAGNOSIS — I63131 Cerebral infarction due to embolism of right carotid artery: Secondary | ICD-10-CM

## 2023-07-05 DIAGNOSIS — I6381 Other cerebral infarction due to occlusion or stenosis of small artery: Secondary | ICD-10-CM | POA: Diagnosis not present

## 2023-07-05 DIAGNOSIS — E785 Hyperlipidemia, unspecified: Secondary | ICD-10-CM

## 2023-07-05 DIAGNOSIS — I10 Essential (primary) hypertension: Secondary | ICD-10-CM | POA: Diagnosis not present

## 2023-07-05 DIAGNOSIS — I639 Cerebral infarction, unspecified: Secondary | ICD-10-CM

## 2023-07-05 DIAGNOSIS — I6389 Other cerebral infarction: Secondary | ICD-10-CM

## 2023-07-05 LAB — CBC
HCT: 37.1 % (ref 36.0–46.0)
Hemoglobin: 12 g/dL (ref 12.0–15.0)
MCH: 29.3 pg (ref 26.0–34.0)
MCHC: 32.3 g/dL (ref 30.0–36.0)
MCV: 90.5 fL (ref 80.0–100.0)
Platelets: 279 10*3/uL (ref 150–400)
RBC: 4.1 MIL/uL (ref 3.87–5.11)
RDW: 12.9 % (ref 11.5–15.5)
WBC: 4.9 10*3/uL (ref 4.0–10.5)
nRBC: 0 % (ref 0.0–0.2)

## 2023-07-05 LAB — BASIC METABOLIC PANEL
Anion gap: 7 (ref 5–15)
BUN: 15 mg/dL (ref 8–23)
CO2: 25 mmol/L (ref 22–32)
Calcium: 9.2 mg/dL (ref 8.9–10.3)
Chloride: 107 mmol/L (ref 98–111)
Creatinine, Ser: 0.94 mg/dL (ref 0.44–1.00)
GFR, Estimated: >60 mL/min (ref >60)
Glucose, Bld: 95 mg/dL (ref 70–99)
Potassium: 4.1 mmol/L (ref 3.5–5.1)
Sodium: 139 mmol/L (ref 135–145)

## 2023-07-05 LAB — ECHOCARDIOGRAM COMPLETE BUBBLE STUDY
AR max vel: 2.14 cm2
AV Peak grad: 7.2 mmHg
Ao pk vel: 1.34 m/s
Area-P 1/2: 3.27 cm2
S' Lateral: 2.9 cm

## 2023-07-05 LAB — PROTIME-INR
INR: 1 (ref 0.8–1.2)
Prothrombin Time: 13.5 s (ref 11.4–15.2)

## 2023-07-05 MED ORDER — CLOPIDOGREL BISULFATE 75 MG PO TABS
75.0000 mg | ORAL_TABLET | Freq: Every day | ORAL | 0 refills | Status: AC
  Filled 2023-07-05: qty 30, 30d supply, fill #0

## 2023-07-05 MED ORDER — PERFLUTREN LIPID MICROSPHERE
1.0000 mL | INTRAVENOUS | Status: AC | PRN
  Administered 2023-07-05: 3 mL via INTRAVENOUS

## 2023-07-05 MED ORDER — PANTOPRAZOLE SODIUM 40 MG PO TBEC
40.0000 mg | DELAYED_RELEASE_TABLET | Freq: Every day | ORAL | 0 refills | Status: AC
  Filled 2023-07-05: qty 30, 30d supply, fill #0

## 2023-07-05 MED ORDER — ASPIRIN 81 MG PO TBEC
81.0000 mg | DELAYED_RELEASE_TABLET | Freq: Every day | ORAL | 0 refills | Status: AC
  Filled 2023-07-05: qty 19, 19d supply, fill #0

## 2023-07-05 MED ORDER — CLOPIDOGREL BISULFATE 75 MG PO TABS
75.0000 mg | ORAL_TABLET | Freq: Every day | ORAL | 0 refills | Status: DC
  Filled 2023-07-05: qty 19, 19d supply, fill #0

## 2023-07-05 NOTE — Progress Notes (Signed)
 Reviewed AVS, patient expressed understanding of medications, MD follow up reviewed.   Removed IV, Site clean, dry and intact.  Patient states all belongings brought to the hospital at time of admission are accounted for and packed to take home.  Picked up medications from [facility] pharmacy. Pt transported to Discharge lounge entrence to meet family for transportation home.

## 2023-07-05 NOTE — Progress Notes (Addendum)
 STROKE TEAM PROGRESS NOTE   BRIEF HPI 83 y.o. 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  Patient not a TNK candidate as she presented outside of the thrombolytic therapy time window.  INTERIM HISTORY/SUBJECTIVE No acute overnight events. Neuro exam and vital signs are stable.   MRI brain shows a small acute infarct in the posterior right corona radiata without associated hemorrhage or mass effect.   Echo shows LVEF of 45 to 50% with mildly dilated left atria.  Will recommend cardiac monitor on discharge. Started on aspirin and Plavix, recommend duration of 3 weeks then followed by Plavix.  OBJECTIVE CBC    Component Value Date/Time   WBC 4.9 07/05/2023 0515   RBC 4.10 07/05/2023 0515   HGB 12.0 07/05/2023 0515   HGB 13.1 09/16/2016 1210   HCT 37.1 07/05/2023 0515   HCT 40.2 09/16/2016 1210   PLT 279 07/05/2023 0515   PLT 324 09/16/2016 1210   MCV 90.5 07/05/2023 0515   MCV 89 09/16/2016 1210   MCH 29.3 07/05/2023 0515   MCHC 32.3 07/05/2023 0515   RDW 12.9 07/05/2023 0515   RDW 13.4 09/16/2016 1210   LYMPHSABS 1.3 07/04/2023 0858   MONOABS 0.6 07/04/2023 0858   EOSABS 0.1 07/04/2023 0858   BASOSABS 0.0 07/04/2023 0858   BMET    Component Value Date/Time   NA 139 07/05/2023 0515   NA 142 09/27/2016 1008   K 4.1 07/05/2023 0515   CL 107 07/05/2023 0515   CO2 25 07/05/2023 0515   GLUCOSE 95 07/05/2023 0515   BUN 15 07/05/2023 0515   BUN 21 09/27/2016 1008   CREATININE 0.94 07/05/2023 0515   CALCIUM 9.2 07/05/2023 0515   GFRNONAA >60 07/05/2023 0515   Lab Results  Component Value Date   CHOL 117 07/04/2023   HDL 43 07/04/2023   LDLCALC 38 07/04/2023   TRIG 181 (H) 07/04/2023   CHOLHDL 2.7 07/04/2023   Lab Results  Component Value Date   HGBA1C 5.0 07/04/2023   IMAGING past 24 hours ECHOCARDIOGRAM  COMPLETE BUBBLE STUDY Result Date: 07/05/2023    ECHOCARDIOGRAM REPORT   Patient Name:   Aimee Hood Date of Exam: 07/05/2023 Medical Rec #:  [id number]    Height:       66.0 in Accession #:    [id number]   Weight:       212.5 lb Date of Birth:  March 26, 1941    BSA:          2.052 m Patient Age:    82 years     BP:           155/64 mmHg Patient Gender: F            HR:           72 bpm. Exam Location:  Inpatient Procedure: 2D Echo, Cardiac Doppler, Color Doppler, Intracardiac Opacification            Agent and Saline Contrast Bubble Study (Both Spectral and Color Flow            Doppler were utilized during procedure). Indications:    Stroke 434.91/ I63.9  History:        Patient has prior history of Echocardiogram examinations, most                 recent 11/22/2016. Previous Myocardial Infarction,  Signs/Symptoms:Chest Pain; Risk Factors:Dyslipidemia. Takotsubo                 Cardiomyopathy.  Sonographer:    Lucendia Herrlich RCS Referring Phys: [id number] Reyne Dumas [facility] IMPRESSIONS  1. Left ventricular ejection fraction, by estimation, is 45 to 50%. The left ventricle has mildly decreased function. The left ventricle demonstrates global hypokinesis. There is mild concentric left ventricular hypertrophy. Left ventricular diastolic parameters are consistent with Grade I diastolic dysfunction (impaired relaxation).  2. Right ventricular systolic function is normal. The right ventricular size is normal.  3. Left atrial size was mildly dilated.  4. The mitral valve is normal in structure. Mild mitral valve regurgitation. No evidence of mitral stenosis.  5. The aortic valve is tricuspid. There is mild calcification of the aortic valve. Aortic valve regurgitation is not visualized. Aortic valve sclerosis is present, with no evidence of aortic valve stenosis.  6. The inferior vena cava is normal in size with greater than 50% respiratory variability, suggesting right atrial pressure of 3 mmHg.  7. Agitated  saline contrast bubble study was negative, with no evidence of any interatrial shunt. Comparison(s): No prior Echocardiogram. FINDINGS  Left Ventricle: Left ventricular ejection fraction, by estimation, is 45 to 50%. The left ventricle has mildly decreased function. The left ventricle demonstrates global hypokinesis. Definity contrast agent was given IV to delineate the left ventricular  endocardial borders. Strain was performed and the global longitudinal strain is indeterminate. The left ventricular internal cavity size was normal in size. There is mild concentric left ventricular hypertrophy. Left ventricular diastolic parameters are  consistent with Grade I diastolic dysfunction (impaired relaxation). Right Ventricle: The right ventricular size is normal. No increase in right ventricular wall thickness. Right ventricular systolic function is normal. Left Atrium: Left atrial size was mildly dilated. Right Atrium: Right atrial size was normal in size. Pericardium: There is no evidence of pericardial effusion. Mitral Valve: The mitral valve is normal in structure. Mild mitral valve regurgitation. No evidence of mitral valve stenosis. Tricuspid Valve: The tricuspid valve is normal in structure. Tricuspid valve regurgitation is not demonstrated. No evidence of tricuspid stenosis. Aortic Valve: The aortic valve is tricuspid. There is mild calcification of the aortic valve. Aortic valve regurgitation is not visualized. Aortic valve sclerosis is present, with no evidence of aortic valve stenosis. Aortic valve peak gradient measures 7.2 mmHg. Pulmonic Valve: The pulmonic valve was normal in structure. Pulmonic valve regurgitation is not visualized. No evidence of pulmonic stenosis. Aorta: The aortic root is normal in size and structure. Venous: The inferior vena cava is normal in size with greater than 50% respiratory variability, suggesting right atrial pressure of 3 mmHg. IAS/Shunts: No atrial level shunt detected by  color flow Doppler. Agitated saline contrast bubble study was negative, with no evidence of any interatrial shunt. Additional Comments: 3D was performed not requiring image post processing on an independent workstation and was indeterminate.  LEFT VENTRICLE PLAX 2D LVIDd:         4.30 cm   Diastology LVIDs:         2.90 cm   LV e' medial:    7.07 cm/s LV PW:         1.30 cm   LV E/e' medial:  10.0 LV IVS:        1.20 cm   LV e' lateral:   5.87 cm/s LVOT diam:     2.00 cm   LV E/e' lateral: 12.0 LV SV:  53 LV SV Index:   26 LVOT Area:     3.14 cm  RIGHT VENTRICLE             IVC RV S prime:     15.90 cm/s  IVC diam: 1.40 cm TAPSE (M-mode): 2.5 cm LEFT ATRIUM             Index        RIGHT ATRIUM           Index LA diam:        4.70 cm 2.29 cm/m   RA Area:     14.15 cm LA Vol (A2C):   67.4 ml 32.82 ml/m  RA Volume:   34.15 ml  16.64 ml/m LA Vol (A4C):   81.0 ml 39.47 ml/m LA Biplane Vol: 75.3 ml 36.69 ml/m  AORTIC VALVE AV Area (Vmax): 2.14 cm AV Vmax:        134.00 cm/s AV Peak Grad:   7.2 mmHg LVOT Vmax:      91.20 cm/s LVOT Vmean:     56.600 cm/s LVOT VTI:       0.170 m  AORTA Ao Root diam: 3.60 cm Ao Asc diam:  3.70 cm MITRAL VALVE MV Area (PHT): 3.27 cm    SHUNTS MV Decel Time: 232 msec    Systemic VTI:  0.17 m MV E velocity: 70.40 cm/s  Systemic Diam: 2.00 cm MV A velocity: 97.10 cm/s MV E/A ratio:  0.73 Riley Lam MD Electronically signed by Riley Lam MD Signature Date/Time: 07/05/2023/1:22:15 PM    Final    Vitals:   07/05/23 0357 07/05/23 0746 07/05/23 0854 07/05/23 1242  BP: (!) 160/76 (!) 155/64  (!) 156/80  Pulse: 65 78  64  Resp: 16 16  16   Temp: 98.3 F (36.8 C) 97.8 F (36.6 C)  98.3 F (36.8 C)  TempSrc: Oral Oral  Oral  SpO2: 98% 95%  96%  Weight:   96.4 kg   Height:   5\' 6"  (1.676 m)    PHYSICAL EXAM General:  Alert, well-nourished, well-developed patient in no acute distress Psych:  Mood and affect appropriate for situation CV: Regular rate and  rhythm on monitor Respiratory:  Regular, unlabored respirations on room air GI: Abdomen soft and nontender  NEURO:  Mental Status: AA&Ox3, patient is able to give clear and coherent history Speech/Language: speech is without dysarthria or aphasia.  Naming, repetition, fluency, and comprehension intact.  Intermittent subtle word finding difficulties.  Cranial Nerves:  II: PERRL. Visual fields full.  III, IV, VI: EOMI. Eyelids elevate symmetrically.  V: Sensation is intact to light touch and symmetrical to face.  VII: Face is symmetrical resting and and with movement VIII: Hearing intact to voice. IX, X: Palate elevates symmetrically. Phonation is normal.  XI: Shoulder shrug 5/5. XII: Tongue is midline without fasciculations. Motor:  LUE: 3/5, mild drift.  4 -/5 grip. LLE: 3/5 distally, 4/5 dorsal and plantarflexion.  Mild drift. RUE/RLE: no drift, spontaneous movement antigravity.  Tone: is normal and bulk is normal Sensation: Intact to light touch bilaterally. Extinction absent to light touch to DSS.   Coordination: FTN intact bilaterally.  Decreased fine motor movements on the left hand.  No overt ataxia noted.  Orbiting noted. Gait: Deferred  ASSESSMENT/PLAN Acute Ischemic Infarct:  Small acute right posterior corona radiata infarct Etiology:  small vessel disease  CT head: No evidence of acute intracranial abnormality.  Parenchymal atrophy, chronic small vessel ischemic disease and chronic infarcts.  Mild  generalized cerebral atrophy. CTA head: No proximal intracranial LVO.  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 intracranial atherosclerotic disease with multifocal stenoses:  Severe stenosis within the right PCA P2 segment Severe stenosis within the right PCA P3 segment Moderate stenosis within the left PCA at the P1/P2 junction CTA neck: The common carotid and internal carotid arteries are patent within the neck without stenosis or significant  atherosclerotic disease.  Venous reflux of contrast partially obscures the left vertebral artery V1 segment.  Within this limitation, the vertebral arteries are patent within the neck without stenosis or significant atherosclerotic disease.  Aortic atherosclerosis. CT perfusion: The perfusion software identifies no core infarct.  The perfusion solver identifies no critically hypoperfused parenchyma.  No mismatch volume reported. MRI brain: Positive for a small 11 mm acute infarct in the posterior right corona radiata tracking toward the insula.  No associated hemorrhage or mass effect.  Progressed chronic ischemic disease since age 61 MRI which is otherwise most pronounced in the posterior left MCA territory. 2D Echo: LVEF 45 to 50%, grade 1 diastolic dysfunction, mildly dilated left atria, mild MVR Recommend heart monitor on discharge LDL 38 HgbA1c 5.0 VTE prophylaxis - SCDs aspirin 81 mg daily prior to admission, now on aspirin 81 mg daily and clopidogrel 75 mg daily for 3 weeks and then plavix alone. Therapy recommendations:  Outpatient PT/OT/ST Disposition:  discharge home 3/25  Left posterior MCA cortically based infarct in 2014 03/10/2013 L MCA infarct with aphasia, right facial droop presentation, felt to be embolic in appearance without identified embolic source s/p IV TPA with resolution of symptoms TTE 55-60% at that time Outpatient TEE with very small PFO and atrioseptal aneurysm Bilateral carotid artery duplex 1-39% ICA stenosis Cardiac event monitor outpatient unremarkable with only occasional PVCs, no evidence of atrial fibrillation No residual symptoms Placed on ASA prophylaxis   Hypertension Home meds:  Lasix, Metoprolol Stable BP goal: Normalize.  Hyperlipidemia Home meds:  rosuvastatin 20 mg, resumed in hospital LDL 38, goal < 70 Continue statin at discharge  Other Stroke Risk Factors Obesity, Body mass index is 34.3 kg/m., BMI >/= 30 associated with increased  stroke risk, recommend weight loss, diet and exercise as appropriate  CAD  Other Active Problems GERD On Omeprazole NSTEMI 2018 Cardiac cath 2018 with mild nonobstructive CAD- 20% mid-LAD stenosis EF reduced to 35% with wall motion abnormalities consistent with stress-induced cardiomyopathy On BB and ACE-I, ASA Last seen by cardiology outpatient in 2018 Vitamin B 12, Vitamin D deficiency Taking supplemental B12 and D3 at home  Hospital day # 0   Pt seen by Neuro NP/APP and later by MD. Note/plan to be edited by MD as needed.    Lynnae January, DNP, AGACNP-BC [facility] Please use AMION for contact information & EPIC for messaging.  I have personally obtained history,examined this patient, reviewed notes, independently viewed imaging studies, participated in medical decision making and plan of care.ROS completed by me personally and pertinent positives fully documented  I have made any additions or clarifications directly to the above note. Agree with note above.  Patient presented with left hemiparesis secondary to right subcortical infarct likely from small vessel disease.  Recommend aspirin and Plavix for 3 weeks followed by Plavix alone and aggressive risk factor modification.  Mobilize out of bed and therapy consults.  Long discussion with patient and family at the bedside and answered questions.  Greater than 50% time during this 50-minute visit was spent on counseling  and coordination of care and discussion patient care team and answering questions.  Delia Heady, MD Medical Director [facility] Pager: [phone] 07/05/2023 3:25 PM   To contact Stroke Continuity provider, please refer to [email or web address]. After hours, contact General Neurology

## 2023-07-05 NOTE — Progress Notes (Signed)
    Durable Medical Equipment  (From admission, onward)           Start     Ordered   07/05/23 1152  For home use only DME Bedside commode  Once       Question:  Patient needs a bedside commode to treat with the following condition  Answer:  Weakness   07/05/23 1151             The patient is confined to one level of the home environment and there is no toilet on the that level of the home.

## 2023-07-05 NOTE — Plan of Care (Signed)
   Problem: Education: Goal: Knowledge of General Education information will improve Description: Including pain rating scale, medication(s)/side effects and non-pharmacologic comfort measures Outcome: Progressing   Problem: Safety: Goal: Ability to remain free from injury will improve Outcome: Progressing

## 2023-07-05 NOTE — Progress Notes (Signed)
 Patient enrolled for [employer] to ship a 30 day cardiac event monitor to her address on file.  Letter with instructions mailed to patient.

## 2023-07-05 NOTE — Evaluation (Signed)
 Physical Therapy Evaluation Patient Details Name: Aimee Hood MRN: [id number] DOB: March 22, 1941 Today's Date: 07/05/2023  History of Present Illness  83 y.o. right-handed female adm 3/24 with hx of remote left MCA distribution cortical stroke presenting with aphasia s/p tPA in 2014 without residual deficit, essential hypertension, hyperlipidemia, NSTEMI in 2018, diverticulitis, GERD, OA, and RLS who presented to the ED this morning via EMS for evaluation of left-sided weakness.  MR Brain:Positive for a small 11 mm Acute Infarct in the posterior right  corona radiata tracking toward the insula  Clinical Impression  Patient presents with decreased mobility due to L LE weakness and decreased coordination.  Previously pt independent, driving, completing ADL and most IADL's independently though some lack of energy since bout of pneumonia per son.  Patient ambulated with rollator and CGA to S in hallway and able to complete sit to stand with CGA though increased time and heavy UE reliance.  Discussed options for rehab and son and pt interested in outpatient PT follow up.  States he thought they had a 3:1 though found they do not.  PT will continue to follow in the acute setting.        If plan is discharge home, recommend the following: A little help with walking and/or transfers;Help with stairs or ramp for entrance;Assist for transportation;Assistance with cooking/housework   Can travel by private vehicle        Equipment Recommendations BSC/3in1  Recommendations for Other Services       Functional Status Assessment Patient has had a recent decline in their functional status and demonstrates the ability to make significant improvements in function in a reasonable and predictable amount of time.     Precautions / Restrictions Precautions Precautions: Fall      Mobility  Bed Mobility           Sit to supine: Modified independent (Device/Increase time)        Transfers Overall  transfer level: Needs assistance Equipment used: Rolling walker (2 wheels) Transfers: Sit to/from Stand Sit to Stand: Contact guard assist           General transfer comment: cues for pushing up from bed and increased time to rise with CGA for balance, cues for reaching up to walker    Ambulation/Gait Ambulation/Gait assistance: Supervision, Contact guard assist Gait Distance (Feet): 200 Feet Assistive device: Rollator (4 wheels) Gait Pattern/deviations: Step-through pattern, Decreased stride length, Trunk flexed       General Gait Details: decreased L foot clearance though no toe drag. able to manage rollator in hallway and around bed in room with CGA/S  Stairs            Wheelchair Mobility     Tilt Bed    Modified Rankin (Stroke Patients Only) Modified Rankin (Stroke Patients Only) Pre-Morbid Rankin Score: No significant disability Modified Rankin: Moderately severe disability     Balance Overall balance assessment: Needs assistance   Sitting balance-Leahy Scale: Good       Standing balance-Leahy Scale: Fair Standing balance comment: standing 30 sec no UE support though increased flexed posture at hips                             Pertinent Vitals/Pain Pain Assessment Pain Assessment: No/denies pain    Home Living Family/patient expects to be discharged to:: Private residence Living Arrangements: Children Available Help at Discharge: Family;Available PRN/intermittently Type of Home: House Home Access: Ramped entrance  Alternate Level Stairs-Number of Steps: flight to upstairs storage, patient does not go up the stairs. Home Layout: Two level;Able to live on main level with bedroom/bathroom Home Equipment: Rolling Walker (2 wheels);Rollator (4 wheels);Wheelchair - manual;Shower seat - built in;Lift chair Additional Comments: DME from spouse who recently passed.    Prior Function Prior Level of Function : Independent/Modified  Independent;Driving             Mobility Comments: was active per son       Extremity/Trunk Assessment   Upper Extremity Assessment Upper Extremity Assessment: Defer to OT evaluation    Lower Extremity Assessment Lower Extremity Assessment: LLE deficits/detail LLE Deficits / Details: AROM WFL, strength hip flexion 4-/5, knee extension 4-/5, ankle DF 4/5, decreased coordination heel to shin compared to R LLE Coordination: decreased gross motor    Cervical / Trunk Assessment Cervical / Trunk Assessment: Kyphotic  Communication   Communication Communication: No apparent difficulties    Cognition Arousal: Alert Behavior During Therapy: Flat affect   PT - Cognitive impairments: No apparent impairments                         Following commands: Intact       Cueing Cueing Techniques: Verbal cues     General Comments General comments (skin integrity, edema, etc.): Son present and supportive.  Educated in fall prevention with lighting, footwear, walker use, shower set up and items frequently used at counter level.  Son reports only in the home 6 months planning to install grabbars in shower and around toilet.    Exercises     Assessment/Plan    PT Assessment Patient needs continued PT services  PT Problem List Decreased balance;Decreased knowledge of use of DME;Decreased coordination;Decreased knowledge of precautions;Decreased mobility;Decreased strength       PT Treatment Interventions DME instruction;Gait training;Balance training;Therapeutic exercise;Functional mobility training;Therapeutic activities;Patient/family education    PT Goals (Current goals can be found in the Care Plan section)  Acute Rehab PT Goals Patient Stated Goal: return to independent PT Goal Formulation: With patient/family Time For Goal Achievement: 07/19/23 Potential to Achieve Goals: Good    Frequency Min 4X/week     Co-evaluation               AM-PAC PT "6  Clicks" Mobility  Outcome Measure Help needed turning from your back to your side while in a flat bed without using bedrails?: None Help needed moving from lying on your back to sitting on the side of a flat bed without using bedrails?: None Help needed moving to and from a bed to a chair (including a wheelchair)?: A Little Help needed standing up from a chair using your arms (e.g., wheelchair or bedside chair)?: A Little Help needed to walk in hospital room?: A Little Help needed climbing 3-5 steps with a railing? : Total 6 Click Score: 18    End of Session Equipment Utilized During Treatment: Gait belt Activity Tolerance: Patient tolerated treatment well Patient left: in chair;with call bell/phone within reach;with family/visitor present   PT Visit Diagnosis: Other abnormalities of gait and mobility (R26.89);Muscle weakness (generalized) (M62.81)    Time: 8295-6213 PT Time Calculation (min) (ACUTE ONLY): 24 min   Charges:   PT Evaluation $PT Eval Low Complexity: 1 Low PT Treatments $Gait Training: 8-22 mins PT General Charges $$ ACUTE PT VISIT: 1 Visit         Sheran Lawless, PT Acute Rehabilitation Services Office:[phone] 07/05/2023  Elray Mcgregor 07/05/2023, 10:29 AM

## 2023-07-05 NOTE — Discharge Summary (Addendum)
 Name: Aimee Hood MRN: [id number] DOB: September 25, 1940 83 y.o. PCP: Andreas Blower., MD  Date of Admission: 07/04/2023  8:55 AM Date of Discharge:  07/05/2023 Attending Physician: Dr. Antony Contras  DISCHARGE DIAGNOSIS:  Primary Problem: Left-sided weakness   Hospital Problems: Principal Problem:   Left-sided weakness Active Problems:   Essential hypertension   GERD (gastroesophageal reflux disease)   HLD (hyperlipidemia)   GAD (generalized anxiety disorder)   CVA (cerebral vascular accident) (HCC)    DISCHARGE MEDICATIONS:   Allergies as of 07/05/2023       Reactions   Nickel    Discovered allergy post knee surgery   Codeine Rash        Medication List     STOP taking these medications    omeprazole 20 MG capsule Commonly known as: PRILOSEC Replaced by: pantoprazole 40 MG tablet   potassium chloride 10 MEQ tablet Commonly known as: KLOR-CON       TAKE these medications    acetaminophen 500 MG tablet Commonly known as: TYLENOL Take 1,000 mg by mouth every 6 (six) hours as needed for mild pain.   aspirin EC 81 MG tablet Take 1 tablet (81 mg total) by mouth daily for 19 days. Swallow whole. Start taking on: July 06, 2023 What changed: additional instructions   cholecalciferol 1000 units tablet Commonly known as: VITAMIN D Take 2,000 Units by mouth daily.   clonazePAM 0.5 MG tablet Commonly known as: KLONOPIN Take 1 tablet (0.5 mg total) by mouth 2 (two) times daily as needed (restless leg).   clopidogrel 75 MG tablet Commonly known as: PLAVIX Take 1 tablet (75 mg total) by mouth daily. Start taking on: July 06, 2023   cyanocobalamin 1000 MCG tablet Commonly known as: VITAMIN B12 Take 1,000 mcg by mouth daily.   DULoxetine 60 MG capsule Commonly known as: CYMBALTA Take 60 mg by mouth at bedtime.   DULoxetine 30 MG capsule Commonly known as: CYMBALTA Take 30 mg by mouth every morning.   fenofibrate micronized 134 MG capsule Commonly known as:  LOFIBRA TAKE 1 CAPSULE (134 MG TOTAL) BY MOUTH DAILY BEFORE BREAKFAST. What changed: See the new instructions.   furosemide 20 MG tablet Commonly known as: LASIX Take 1 tablet (20 mg total) by mouth daily. Please make overdue appt with Dr. Anne Fu before anymore refills. 3rd and Final Attempt   metoprolol succinate 50 MG 24 hr tablet Commonly known as: TOPROL-XL Take 1 tablet (50 mg total) by mouth daily. Please make overdue appt with Dr. Anne Fu before anymore refills. 2nd attempt What changed: when to take this   nitroGLYCERIN 0.4 MG SL tablet Commonly known as: NITROSTAT Place 0.4 mg under the tongue every 5 (five) minutes as needed for chest pain. X 3 doses   pantoprazole 40 MG tablet Commonly known as: PROTONIX Take 1 tablet (40 mg total) by mouth daily. Start taking on: July 06, 2023 Replaces: omeprazole 20 MG capsule   ramipril 10 MG capsule Commonly known as: ALTACE Take 1 capsule (10 mg total) by mouth daily. Please make overdue appt with Dr. Anne Fu before anymore refills. 1st attempt What changed: how much to take   rosuvastatin 20 MG tablet Commonly known as: CRESTOR Take 20 mg by mouth at bedtime.   traZODone 50 MG tablet Commonly known as: DESYREL Take 50 mg by mouth at bedtime.               Durable Medical Equipment  (From admission, onward)  Start     Ordered   07/05/23 1152  For home use only DME Bedside commode  Once       Question:  Patient needs a bedside commode to treat with the following condition  Answer:  Weakness   07/05/23 1151            DISPOSITION AND FOLLOW-UP:  AimeeAimee Hood was discharged from [facility] in Good condition. At the hospital follow up visit please address:  Stroke: Patient is discharged with aspirin and Plavix for 3 weeks then Plavix alone.  She is also discharged with a heart monitor, and laboratory referral to cardiology was placed.  She is also discharged with home health PT  and OT. Hypertension: Discharged to restart home medication Lasix 20 mg daily, metoprolol succinate 50 mg daily, ramipril 10 mg daily. Hyperlipidemia: She is discharged with home medications Crestor 20 mg daily and fenofibrate 134 mg daily   Follow-up Recommendations: Consults: None Labs: Basic Metabolic Profile and CBC Studies: Cardiac Monitor  Medications: Aspirin and plavix for 19 days, then plavix alone.   Follow-up Appointments:  Follow-up Information     [facility]. Schedule an appointment as soon as possible for a visit.   Specialty: Rehabilitation Contact information: [address] [facility] [place] [address] [phone]                HOSPITAL COURSE:  Patient Summary: Aimee Hood is a 83 y.o. person living with a history of hypertension, hyperlipidemia, GAD, GERD, obesity, vitamin B12 deficiency, vitamin D deficiency, recurrent epistaxis, remote left MCA distribution cortical stroke in 2014 who presented with new left sided weakness and admitted for Stroke workup on hospital day 0   Acute infarct Posterior R corona radiata  Hx of prior Left MCA CVA Aspirin failure Presented with new left sided weakness, no speech deficits. LKN 2100 last night. MR brain notable for small 11 mm acute infarct in the posterior right corona radiata tracking toward the insula, no hemorrhage.  Progression of the chronic ischemic disease. CTA H/N negative for intracranial LVO, findings did show stenosis of multiple segments of PCA. Has unchanged L ACA pericallosal aneurysm. S/p Aspirin and Plavix load 3/24. A1c 5.0, LDL 38, PTT 13.5, INR 1.0.; PT/OT recommended HHPT/OT. ECHO showed LVEF of 45 to 50%, mildly decreased LV function, global hypokinesis grade 1 diastolic dysfunction, normal RV function.  Bubble study negative.  Per evaluation today, patient denied any new weakness.  Reported improvement in her lower extremity weakness.  No  speech deficits noted.  Source of infarct likely atherosclerotic from the vessel occlusions noted in the CTA head.  Patient is sent home with a heart monitor to follow-up with a cardiology outpatient to monitor for a cardioembolic source.  Otherwise stable for discharge.  Patient is no notified to take aspirin Plavix for 19 days, to complete 21 days then Plavix alone.   HTN Hold home medications Lasix 20 mg daily, metoprolol succinate 2 mg daily, ramipril 10 mg daily for permissive HTN. Will restart upon discharge.      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 No signs or symptoms of heart failure. Recommend strict BP control and follow up with her cardiologist after discharge.   HLD LDL 38, continue home dose crestor 20 mg daily and fenofibrate 160 mg daily   GAD -  Continue home medication Klonopin 0.5 mg twice daily, prn -Continue Cymbalta 30 mg in the morning and 60 mg at bedtime   GERD -Continue home medication Protonix 40 mg daily   History of vitamin D deficiency Hx of B12 deficiency -Continue home medications vitamin D3 2000 u daily -Continue home medication B12 1000 mcg daily    Insomnia -Continue home medication trazodone 50 mg daily at bedtime   DISCHARGE INSTRUCTIONS:   Discharge Instructions     Ambulatory referral to Cardiology   Complete by: As directed    Ambulatory referral to Occupational Therapy   Complete by: As directed    Ambulatory referral to Physical Therapy   Complete by: As directed    Diet - low sodium heart healthy   Complete by: As directed    Discharge instructions   Complete by: As directed    Aimee Hood  You came to the hospital for stroke.   For you stroke: Please take the following medications - Take Aspirin 81 mg, take 1 tablet by mouth daily for 19 days, then stop -Take Plavix 75 mg, take 1 tablet by mouth daily,  indefinitely -We sent you a heart monitor, please wear it, please follow-up with the heart doctor  Otherwise no other changes to medications were made, please continue taking the rest of your medications as prescribed.  Please make sure you schedule an appointment with your primary care doctor.   If you have any of these following symptoms, please call us or seek care at an emergency department: -Chest Pain -Difficulty Breathing -Worsening abdominal pain -Syncope (passing out) -Drooping of face -Slurred speech -Sudden weakness in your leg or arm -Fever -Chills -blood in the stool -dark black, sticky stool  If you have any questions or concerns, call our clinic at [phone] or after hours call [phone] and ask for the internal medicine resident on call.  I am glad you are feeling better. It was a pleasure taking care for you. I wish a good recovery and good health!   Dr. Jeral Pinch   Increase activity slowly   Complete by: As directed        SUBJECTIVE:  Patient is evaluated at bedside, reports doing well.  She denies any new weakness or worsening of weakness.  Reports that her left lower extremity has improved.  Reports that she was able to get up and go to the restroom using the walker.  Denies any new chest pain or shortness of breath.  Denies any speech difficulties, abdominal pain, nausea or vomiting.  Son at bedside, all questions were answered. Discharge Vitals:   BP (!) 156/80 (BP Location: Right Arm)   Pulse 64   Temp 98.3 F (36.8 C) (Oral)   Resp 16   Ht 5\' 6"  (1.676 m)   Wt 96.4 kg   SpO2 96%   BMI 34.30 kg/m   OBJECTIVE:  Physical Exam   General: Laying in bed, no acute distress Cardiovascular: Regular rate Pulmonary: Normal work of breathing, no wheezing or crackles Abdomen: Soft, nontender, nondistended, bowel sounds present Neuro: Awake, alert, speaking in full sentences, no speech deficits, 5/5 grip strength b/l, 4/5 strength b/l LE.    Pertinent Labs, Studies, and Procedures:     Latest Ref Rng & Units 07/05/2023    5:15 AM 07/04/2023    9:00 AM 07/04/2023    8:58 AM  CBC  WBC 4.0 - 10.5 K/uL 4.9   5.3   Hemoglobin 12.0 - 15.0 g/dL 12.0  13.3  12.2   Hematocrit 36.0 - 46.0 % 37.1  39.0  38.8   Platelets 150 - 400 K/uL 279   279        Latest Ref Rng & Units 07/05/2023    5:15 AM 07/04/2023    9:00 AM 07/04/2023    8:58 AM  CMP  Glucose 70 - 99 mg/dL 95  045  409   BUN 8 - 23 mg/dL 15  20  18    Creatinine 0.44 - 1.00 mg/dL 8.11  9.14  7.82   Sodium 135 - 145 mmol/L 139  140  138   Potassium 3.5 - 5.1 mmol/L 4.1  4.0  4.0   Chloride 98 - 111 mmol/L 107  104  105   CO2 22 - 32 mmol/L 25   24   Calcium 8.9 - 10.3 mg/dL 9.2   9.5   Total Protein 6.5 - 8.1 g/dL   6.3   Total Bilirubin 0.0 - 1.2 mg/dL   0.9   Alkaline Phos 38 - 126 U/L   55   AST 15 - 41 U/L   19   ALT 0 - 44 U/L   11     ECHOCARDIOGRAM COMPLETE BUBBLE STUDY Result Date: 07/05/2023    ECHOCARDIOGRAM REPORT   Patient Name:   Aimee Hood Date of Exam: 07/05/2023 Medical Rec #:  [id number]    Height:       66.0 in Accession #:    [id number]   Weight:       212.5 lb Date of Birth:  [birth year]    BSA:          2.052 m Patient Age:    82 years     BP:           155/64 mmHg Patient Gender: F            HR:           72 bpm. Exam Location:  Inpatient Procedure: 2D Echo, Cardiac Doppler, Color Doppler, Intracardiac Opacification            Agent and Saline Contrast Bubble Study (Both Spectral and Color Flow            Doppler were utilized during procedure). Indications:    Stroke 434.91/ I63.9  History:        Patient has prior history of Echocardiogram examinations, most                 recent 11/22/2016. Previous Myocardial Infarction,                 Signs/Symptoms:Chest Pain; Risk Factors:Dyslipidemia. Takotsubo                 Cardiomyopathy.  Sonographer:    Lucendia Herrlich RCS Referring Phys: [id number] Reyne Dumas [facility] IMPRESSIONS  1. Left ventricular  ejection fraction, by estimation, is 45 to 50%. The left ventricle has mildly decreased function. The left ventricle demonstrates global hypokinesis. There is mild concentric left ventricular hypertrophy. Left ventricular diastolic parameters are consistent with Grade I diastolic dysfunction (impaired relaxation).  2. Right ventricular systolic function is normal. The right ventricular size is normal.  3. Left atrial size was mildly dilated.  4. The mitral valve is normal in structure. Mild mitral valve regurgitation. No evidence of mitral stenosis.  5. The aortic valve is tricuspid. There is mild calcification of the aortic valve. Aortic valve regurgitation is not visualized. Aortic valve sclerosis is present, with  no evidence of aortic valve stenosis.  6. The inferior vena cava is normal in size with greater than 50% respiratory variability, suggesting right atrial pressure of 3 mmHg.  7. Agitated saline contrast bubble study was negative, with no evidence of any interatrial shunt. Comparison(s): No prior Echocardiogram. FINDINGS  Left Ventricle: Left ventricular ejection fraction, by estimation, is 45 to 50%. The left ventricle has mildly decreased function. The left ventricle demonstrates global hypokinesis. Definity contrast agent was given IV to delineate the left ventricular  endocardial borders. Strain was performed and the global longitudinal strain is indeterminate. The left ventricular internal cavity size was normal in size. There is mild concentric left ventricular hypertrophy. Left ventricular diastolic parameters are  consistent with Grade I diastolic dysfunction (impaired relaxation). Right Ventricle: The right ventricular size is normal. No increase in right ventricular wall thickness. Right ventricular systolic function is normal. Left Atrium: Left atrial size was mildly dilated. Right Atrium: Right atrial size was normal in size. Pericardium: There is no evidence of pericardial effusion. Mitral  Valve: The mitral valve is normal in structure. Mild mitral valve regurgitation. No evidence of mitral valve stenosis. Tricuspid Valve: The tricuspid valve is normal in structure. Tricuspid valve regurgitation is not demonstrated. No evidence of tricuspid stenosis. Aortic Valve: The aortic valve is tricuspid. There is mild calcification of the aortic valve. Aortic valve regurgitation is not visualized. Aortic valve sclerosis is present, with no evidence of aortic valve stenosis. Aortic valve peak gradient measures 7.2 mmHg. Pulmonic Valve: The pulmonic valve was normal in structure. Pulmonic valve regurgitation is not visualized. No evidence of pulmonic stenosis. Aorta: The aortic root is normal in size and structure. Venous: The inferior vena cava is normal in size with greater than 50% respiratory variability, suggesting right atrial pressure of 3 mmHg. IAS/Shunts: No atrial level shunt detected by color flow Doppler. Agitated saline contrast bubble study was negative, with no evidence of any interatrial shunt. Additional Comments: 3D was performed not requiring image post processing on an independent workstation and was indeterminate.  LEFT VENTRICLE PLAX 2D LVIDd:         4.30 cm   Diastology LVIDs:         2.90 cm   LV e' medial:    7.07 cm/s LV PW:         1.30 cm   LV E/e' medial:  10.0 LV IVS:        1.20 cm   LV e' lateral:   5.87 cm/s LVOT diam:     2.00 cm   LV E/e' lateral: 12.0 LV SV:         53 LV SV Index:   26 LVOT Area:     3.14 cm  RIGHT VENTRICLE             IVC RV S prime:     15.90 cm/s  IVC diam: 1.40 cm TAPSE (M-mode): 2.5 cm LEFT ATRIUM             Index        RIGHT ATRIUM           Index LA diam:        4.70 cm 2.29 cm/m   RA Area:     14.15 cm LA Vol (A2C):   67.4 ml 32.82 ml/m  RA Volume:   34.15 ml  16.64 ml/m LA Vol (A4C):   81.0 ml 39.47 ml/m LA Biplane Vol: 75.3 ml 36.69 ml/m  AORTIC VALVE AV Area (  Vmax): 2.14 cm AV Vmax:        134.00 cm/s AV Peak Grad:   7.2 mmHg LVOT Vmax:       91.20 cm/s LVOT Vmean:     56.600 cm/s LVOT VTI:       0.170 m  AORTA Ao Root diam: 3.60 cm Ao Asc diam:  3.70 cm MITRAL VALVE MV Area (PHT): 3.27 cm    SHUNTS MV Decel Time: 232 msec    Systemic VTI:  0.17 m MV E velocity: 70.40 cm/s  Systemic Diam: 2.00 cm MV A velocity: 97.10 cm/s MV E/A ratio:  0.73 Riley Lam MD Electronically signed by Riley Lam MD Signature Date/Time: 07/05/2023/1:22:15 PM    Final    MR BRAIN WO CONTRAST Result Date: 07/04/2023 CLINICAL DATA:  83 year old female code stroke presentation. Left side deficit. EXAM: MRI HEAD WITHOUT CONTRAST TECHNIQUE: Multiplanar, multiecho pulse sequences of the brain and surrounding structures were obtained without intravenous contrast. COMPARISON:  CT head, CTA and CTP today reported separately. [facility] brain MRI 06/10/2018. FINDINGS: Brain: 11 mm wedge-shaped area of restricted diffusion in the posterior right corona radiata tracking toward the superior right insula (series 11, image 19). Subtle T2 and FLAIR hyperintensity associated. No other convincing diffusion restriction. Since 2020 progressed chronic encephalomalacia and gliosis in the posterior left MCA territory. Cortical and white matter involvement there. Small chronic infarct in the inferior left cerebellum is stable. Small chronic lacunar infarcts in the bilateral deep gray nuclei appear more numerous. No chronic cerebral blood products identified on SWI. No midline shift, mass effect, evidence of mass lesion, ventriculomegaly, extra-axial collection or acute intracranial hemorrhage. Cervicomedullary junction and pituitary are within normal limits. Vascular: Major intracranial vascular flow voids are stable since 2020. Skull and upper cervical spine: Negative. Visualized bone marrow signal is within normal limits. Sinuses/Orbits: Left sphenoid sinus disease redemonstrated. Mild mucosal thickening elsewhere. Postoperative changes to both globes. Other:  Mastoids well aerated. Visible scalp and face appear within normal limits. IMPRESSION: 1. Positive for a small 11 mm Acute Infarct in the posterior right corona radiata tracking toward the insula. No associated hemorrhage or mass effect. 2. Progressed chronic ischemic disease since a 2020 MRI, which is otherwise most pronounced in the posterior left MCA territory. Electronically Signed   By: Odessa Fleming M.D.   On: 07/04/2023 13:11   CT ANGIO HEAD NECK W WO CM W PERF (CODE STROKE) Result Date: 07/04/2023 CLINICAL DATA:  Provided history: Neuro deficit, acute, stroke suspected. Left arm weakness. EXAM: CT ANGIOGRAPHY HEAD AND NECK CT PERFUSION BRAIN TECHNIQUE: Multidetector CT imaging of the head and neck was performed using the standard protocol during bolus administration of intravenous contrast. Multiplanar CT image reconstructions and MIPs were obtained to evaluate the vascular anatomy. Carotid stenosis measurements (when applicable) are obtained utilizing NASCET criteria, using the distal internal carotid diameter as the denominator. Multiphase CT imaging of the brain was performed following IV bolus contrast injection. Subsequent parametric perfusion maps were calculated using RAPID software. RADIATION DOSE REDUCTION: This exam was performed according to the departmental dose-optimization program which includes automated exposure control, adjustment of the mA and/or kV according to patient size and/or use of iterative reconstruction technique. CONTRAST:  OMNIPAQUE IOHEXOL 350 MG/ML SOLN COMPARISON:  Non-contrast head CT 07/04/2023. CT angiogram head 01/29/2013. FINDINGS: CTA NECK FINDINGS: Aortic arch: Standard aortic branching. Atherosclerotic plaque within the visualized aortic arch. Streak/beam hardening artifact arising from a dense contrast bolus partially obscures the left subclavian artery. Within this  limitation, there is no appreciable hemodynamically significant innominate or proximal subclavian  artery stenosis. Right carotid system: CCA and ICA patent within the neck without stenosis or significant atherosclerotic disease. Left carotid system: CCA and ICA patent within the neck without stenosis or significant atherosclerotic disease. Vertebral arteries: Venous reflux of contrast partially obscures the left vertebral artery V1 segment. Within this limitation, the vertebral arteries are codominant and patent within the neck without stenosis or significant atherosclerotic disease. Skeleton: No acute fracture or aggressive osseous lesion. Other neck: Subcentimeter thyroid nodules not meeting consensus criteria for ultrasound follow-up based on size. No follow-up imaging recommended. Reference: J Am Coll Radiol. 2015 Feb;12(2): 143-50. Upper chest: No consolidation within the imaged lung apices. Review of the MIP images confirms the above findings CTA HEAD FINDINGS Anterior circulation: The intracranial internal carotid arteries are patent. Atherosclerotic plaque within both vessels with no more than mild stenosis. The M1 middle cerebral arteries are patent. No M2 proximal branch occlusion or high-grade proximal stenosis. The anterior cerebral arteries are patent. 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 Posterior circulation: The intracranial vertebral arteries are patent. Minimal non-stenotic atherosclerotic plaque within the right V4 segment. The basilar artery is patent. The posterior cerebral arteries are patent. Severe stenosis within the right PCA P2 segment (series 11, image 21). Severe stenosis within the right PCA P3 segment (series 11, image 21). Moderate stenosis within the left PCA at the P1/P2 junction (series 11, image 21). Posterior communicating arteries are diminutive or absent, bilaterally. Venous sinuses: Within the limitations of contrast timing, no convincing thrombus. Anatomic variants: As described. Review of the MIP images  confirms the above findings CT Brain Perfusion Findings: CBF (<30%) Volume: 0mL Perfusion (Tmax>6.0s) volume: 0mL Mismatch Volume: 0mL Infarction Location:None identified. No emergent large vessel occlusion identified. These results were communicated to Dr. Iver Nestle At 9:59 amon 3/24/2025by text page via the [facility] messaging system. IMPRESSION: CTA neck: 1. The common carotid and internal carotid arteries are patent within the neck without stenosis or significant atherosclerotic disease. 2. Venous reflux of contrast partially obscures the left vertebral artery V1 segment. Within this limitation, the vertebral arteries are patent within the neck without stenosis or significant atherosclerotic disease. 3. Aortic Atherosclerosis (ICD10-I70.0). CTA head: 1. No proximal intracranial large vessel occlusion identified. 2. Intracranial atherosclerotic disease with multifocal stenoses, most notably as follows. 3. Severe stenosis within the right PCA P2 segment. 4. Severe stenosis within the right PCA P3 segment. 5. Moderate stenosis within the left PCA at the P1/P2 junction. 6. 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 CT perfusion head: The perfusion software identifies no core infarct. The perfusion software identifies no critically hypoperfused parenchyma (utilizing the Tmax>6 seconds threshold). No mismatch volume reported. Electronically Signed   By: Jackey Loge D.O.   On: 07/04/2023 09:59   CT HEAD CODE STROKE WO CONTRAST Result Date: 07/04/2023 CLINICAL DATA:  Code stroke. Provided history: Neuro deficit, acute, stroke suspected. Left arm/leg weakness. EXAM: CT HEAD WITHOUT CONTRAST TECHNIQUE: Contiguous axial images were obtained from the base of the skull through the vertex without intravenous contrast. RADIATION DOSE REDUCTION: This exam was performed according to the departmental dose-optimization program which includes automated exposure control, adjustment of the mA  and/or kV according to patient size and/or use of iterative reconstruction technique. COMPARISON:  Brain MRI 03/10/2013. MRA head 03/10/2013. Head CT 03/09/2013. FINDINGS: Brain: Mild generalized cerebral atrophy. Chronic cortical/subcortical  infarcts within the posterior left temporal, left frontal and left parietal lobes (MCA vascular territory). Patchy and ill-defined hypoattenuation elsewhere within the cerebral white matter, nonspecific but compatible with mild chronic small vessel ischemic disease. Small chronic infarcts within the left cerebellar hemisphere. There is no acute intracranial hemorrhage. No acute demarcated cortical infarct. No extra-axial fluid collection. No evidence of an intracranial mass. No midline shift. Vascular: No hyperdense vessel.  Atherosclerotic calcifications. Skull: No calvarial fracture or aggressive osseous lesion. Sinuses/Orbits: No mass or acute finding within the imaged orbits. Small-volume frothy secretions within the right sphenoid sinus. 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 Other: Nasal septal defect. ASPECTS [facility] Stroke Program Early CT Score) - Ganglionic level infarction (caudate, lentiform nuclei, internal capsule, insula, M1-M3 cortex): 7 - Supraganglionic infarction (M4-M6 cortex): 3 Total score (0-10 with 10 being normal): 10 (when discounting chronic left MCA territory infarct). No evidence of an acute intracranial abnormality. These results were communicated to Bhagat at 9:18 amon 3/24/2025by text page via the [facility] messaging system. IMPRESSION: 1. No evidence of an acute intracranial abnormality. 2. Parenchymal atrophy, chronic small vessel ischemic disease and chronic infarcts, as described. 3. Mild generalized cerebral atrophy. 4. Paranasal sinus disease as outlined. Correlate for signs/symptoms of acute sinusitis. 5. Nasal septal defect. Electronically Signed   By: Jackey Loge D.O.   On: 07/04/2023 09:19     Signed: Jeral Pinch, D.O.  Internal Medicine Resident, PGY-1 [facility]  Pager: [phone] 2:39 PM, 07/05/2023

## 2023-07-05 NOTE — TOC Transition Note (Signed)
 Transition of Care [facility]) - Discharge Note   Patient Details  Name: Aimee Hood MRN: [id number] Date of Birth: 10-23-1940  Transition of Care [facility]) CM/SW Contact:  Kermit Balo, RN Phone Number: 07/05/2023, 2:40 PM   Clinical Narrative:     Pt is discharging home with outpatient therapy through [facility].  Pts son will transport her home.  Final next level of care: OP Rehab Barriers to Discharge: No Barriers Identified   Patient Goals and CMS Choice     Choice offered to / list presented to : Patient, Adult Children      Discharge Placement                       Discharge Plan and Services Additional resources added to the After Visit Summary for     Discharge Planning Services: CM Consult            DME Arranged: Bedside commode DME Agency: [employer] Date DME Agency Contacted: 07/05/23   Representative spoke with at DME Agency: Timothy Lasso            Social Drivers of Health (SDOH) Interventions SDOH Screenings   Food Insecurity: No Food Insecurity (07/04/2023)  Housing: Low Risk  (07/04/2023)  Transportation Needs: No Transportation Needs (07/04/2023)  Utilities: Not At Risk (07/04/2023)  Social Connections: Moderately Isolated (07/04/2023)  Tobacco Use: Low Risk  (07/04/2023)     Readmission Risk Interventions     No data to display

## 2023-07-05 NOTE — Care Management Obs Status (Signed)
 MEDICARE OBSERVATION STATUS NOTIFICATION   Patient Details  Name: Aimee Hood MRN: [id number] Date of Birth: June 02, 1940   Medicare Observation Status Notification Given:  Yes    Kermit Balo, RN 07/05/2023, 11:29 AM

## 2023-07-05 NOTE — TOC Initial Note (Signed)
 Transition of Care [facility]) - Initial/Assessment Note    Patient Details  Name: Aimee Hood MRN: [id number] Date of Birth: [birth year]  Transition of Care [facility]) CM/SW Contact:    Kermit Balo, RN Phone Number: 07/05/2023, 12:01 PM  Clinical Narrative:                  Pt is from home with her son. Son works in the am until about lunch time.  Pt was driving self but son can assist as needed.  Pt manages her own medications and she denies any issues.  BSC ordered through [employer] and will be delivered to the room. Outpatient therapy arranged through [facility]. Pt will call to schedule the first appointment. TOC following.  Expected Discharge Plan: OP Rehab Barriers to Discharge: Continued Medical Work up   Patient Goals and CMS Choice     Choice offered to / list presented to : Patient, Adult Children      Expected Discharge Plan and Services   Discharge Planning Services: CM Consult   Living arrangements for the past 2 months: Single Family Home                 DME Arranged: Bedside commode DME Agency: [employer] Date DME Agency Contacted: 07/05/23   Representative spoke with at DME Agency: Zack            Prior Living Arrangements/Services Living arrangements for the past 2 months: Single Family Home Lives with:: Adult Children Patient language and need for interpreter reviewed:: Yes Do you feel safe going back to the place where you live?: Yes        Care giver support system in place?: Yes (comment) Current home services: DME (cane/ walker/ wheelchair/ rollator) Criminal Activity/Legal Involvement Pertinent to Current Situation/Hospitalization: No - Comment as needed  Activities of Daily Living   ADL Screening (condition at time of admission) Independently performs ADLs?: Yes (appropriate for developmental age) Is the patient deaf or have difficulty hearing?: Yes Does the patient have difficulty seeing, even when wearing glasses/contacts?: No Does  the patient have difficulty concentrating, remembering, or making decisions?: Yes  Permission Sought/Granted                  Emotional Assessment Appearance:: Appears stated age Attitude/Demeanor/Rapport: Engaged Affect (typically observed): Accepting Orientation: : Oriented to Self, Oriented to Place, Oriented to  Time, Oriented to Situation   Psych Involvement: No (comment)  Admission diagnosis:  Left-sided weakness [R53.1] Cerebrovascular accident (CVA), unspecified mechanism (HCC) [I63.9] Patient Active Problem List   Diagnosis Date Noted   Left-sided weakness 07/04/2023   HLD (hyperlipidemia) 07/04/2023   GAD (generalized anxiety disorder) 07/04/2023   Impingement syndrome of right shoulder 10/28/2021   Carpal tunnel syndrome, bilateral 12/29/2018   Pain in right knee 12/05/2018   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   NSTEMI (non-ST elevated myocardial infarction) (HCC)    Chest pain with moderate risk of acute coronary syndrome 08/31/2016   Normal coronary arteries-2016 08/31/2016   Chest pain on exertion 06/11/2014   Osteoarthritis of left knee 07/12/2013   S/P total knee replacement using cement 07/10/2013   Aneurysm, cerebral, nonruptured 04/23/2013   History of LMCA CVA-TPA 2014 03/09/2013   Osteoarthritis 11/14/2012   Restless legs syndrome 11/14/2012   GERD (gastroesophageal reflux disease) 11/13/2012   Essential hypertension 11/05/2012   Other and unspecified hyperlipidemia 11/05/2012   PCP:  Andreas Blower., MD Pharmacy:   [facility] [phone] - [place], [place] - [address] [place] [address] Phone: [phone] Fax: [phone]  [facility] - [place], [place] - [address] [place] [address] Phone: [phone] Fax: [phone]     Social Drivers of Health (SDOH) Social History: SDOH Screenings   Food Insecurity: No Food Insecurity (07/04/2023)  Housing: Low Risk   (07/04/2023)  Transportation Needs: No Transportation Needs (07/04/2023)  Utilities: Not At Risk (07/04/2023)  Social Connections: Moderately Isolated (07/04/2023)  Tobacco Use: Low Risk  (07/04/2023)   SDOH Interventions:     Readmission Risk Interventions     No data to display

## 2023-07-05 NOTE — Progress Notes (Signed)
 Echocardiogram 2D Echocardiogram has been performed.  Aimee Hood 07/05/2023, 12:53 PM

## 2023-07-07 ENCOUNTER — Ambulatory Visit: Attending: Internal Medicine | Admitting: Occupational Therapy

## 2023-07-07 DIAGNOSIS — M6281 Muscle weakness (generalized): Secondary | ICD-10-CM | POA: Insufficient documentation

## 2023-07-07 DIAGNOSIS — R278 Other lack of coordination: Secondary | ICD-10-CM | POA: Insufficient documentation

## 2023-07-07 DIAGNOSIS — R2681 Unsteadiness on feet: Secondary | ICD-10-CM | POA: Insufficient documentation

## 2023-07-07 DIAGNOSIS — R41840 Attention and concentration deficit: Secondary | ICD-10-CM | POA: Insufficient documentation

## 2023-07-07 DIAGNOSIS — I639 Cerebral infarction, unspecified: Secondary | ICD-10-CM | POA: Diagnosis not present

## 2023-07-07 DIAGNOSIS — R2689 Other abnormalities of gait and mobility: Secondary | ICD-10-CM | POA: Diagnosis present

## 2023-07-07 NOTE — Therapy (Signed)
 OUTPATIENT OCCUPATIONAL THERAPY NEURO EVALUATION  Patient Name: Aimee Hood MRN: [id number] DOB:May 07, 1940, 83 y.o., female Today's Date: 07/09/2023  PCP: Dr. Norval Gable PROVIDER:Guiloud MD END OF SESSION:  OT End of Session - 07/09/23 1038     Visit Number 1    Number of Visits 25    Date for OT Re-Evaluation 09/29/23    Authorization Type HTA    Authorization - Visit Number 1    Progress Note Due on Visit 10    OT Start Time 0803    OT Stop Time 0837    OT Time Calculation (min) 34 min             Past Medical History:  Diagnosis Date   Allergy    Arthritis    "all over" (07/10/2013)   CAD (coronary artery disease)    a. normal cors by cath in 2016  b. 08/2016: admitted with an NSTEMI. Cath showed mild nonobstructive CAD with only 20% mid-LAD stenosis. EF was reduced to 35% and wall motion abnormalities were suggestive of a stress-induced cardiomyopathy.   Carpal tunnel syndrome of left wrist    Cerebral aneurysm, nonruptured    3-4 mm in size, stable since 2004 (most recent MRI 2014)   Colon polyp 2007   Diverticulitis    Family history of anesthesia complication    "brother had PONV"   Gastroesophageal reflux disease    High cholesterol    Hypertension    Insomnia    Macular degeneration 08/31/2016   PONV (postoperative nausea and vomiting)    Restless leg syndrome    Stroke [facility]) 02/2013   denies residual on 07/10/2013)   Past Surgical History:  Procedure Laterality Date   BREAST SURGERY     CHOLECYSTECTOMY     JOINT REPLACEMENT     KNEE ARTHROSCOPY Bilateral    LEFT HEART CATH AND CORONARY ANGIOGRAPHY N/A 09/01/2016   Procedure: Left Heart Cath and Coronary Angiography;  Surgeon: Iran Ouch, MD;  Location: [facility] INVASIVE CV LAB;  Service: Cardiovascular;  Laterality: N/A;   LEFT HEART CATHETERIZATION WITH CORONARY ANGIOGRAM N/A 06/20/2014   Procedure: LEFT HEART CATHETERIZATION WITH CORONARY ANGIOGRAM;  Surgeon: Jake Bathe, MD;  Location: [facility]  CATH LAB;  Service: Cardiovascular;  Laterality: N/A;   TEE WITHOUT CARDIOVERSION N/A 03/14/2013   Procedure: TRANSESOPHAGEAL ECHOCARDIOGRAM (TEE);  Surgeon: Donato Schultz, MD;  Location: [facility] ENDOSCOPY;  Service: Cardiovascular;  Laterality: N/A;   TOTAL KNEE ARTHROPLASTY Left 07/10/2013   TOTAL KNEE ARTHROPLASTY Left 07/10/2013   Procedure: TOTAL KNEE ARTHROPLASTY;  Surgeon: Valeria Batman, MD;  Location: [facility] OR;  Service: Orthopedics;  Laterality: Left;   VAGINAL HYSTERECTOMY  2000's   Patient Active Problem List   Diagnosis Date Noted   CVA (cerebral vascular accident) (HCC) 07/05/2023   Left-sided weakness 07/04/2023   HLD (hyperlipidemia) 07/04/2023   GAD (generalized anxiety disorder) 07/04/2023   Impingement syndrome of right shoulder 10/28/2021   Carpal tunnel syndrome, bilateral 12/29/2018   Pain in right knee 12/05/2018   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   NSTEMI (non-ST elevated myocardial infarction) (HCC)    Chest pain with moderate risk of acute coronary syndrome 08/31/2016   Normal coronary arteries-2016 08/31/2016   Chest pain on exertion 06/11/2014   Osteoarthritis of left knee 07/12/2013   S/P total knee replacement using cement 07/10/2013   Aneurysm, cerebral, nonruptured 04/23/2013   History of LMCA CVA-TPA 2014 03/09/2013   Osteoarthritis 11/14/2012   Restless legs syndrome 11/14/2012   GERD (  gastroesophageal reflux disease) 11/13/2012   Essential hypertension 11/05/2012   Other and unspecified hyperlipidemia 11/05/2012    ONSET DATE: 07/04/23  REFERRING DIAG: I63.9 (ICD-10-CM) - Cerebrovascular accident (CVA), unspecified mechanism (HCC)  THERAPY DIAG:  Muscle weakness (generalized) - Plan: Ot plan of care cert/re-cert  Other lack of coordination - Plan: Ot plan of care cert/re-cert  Unsteadiness on feet - Plan: Ot plan of care cert/re-cert  Other abnormalities of gait and mobility - Plan: Ot plan of care cert/re-cert  Attention and concentration  deficit - Plan: Ot plan of care cert/re-cert  Rationale for Evaluation and Treatment: Rehabilitation  SUBJECTIVE:   SUBJECTIVE STATEMENT: Pt reports she wants to walk better Pt accompanied by: family member  PERTINENT HISTORY: 83 y.o. right-handed female hospitalized 3/24 with hx of remote left MCA distribution cortical stroke presenting with aphasia s/p tPA in 2014 without residual deficit, essential hypertension, hyperlipidemia, NSTEMI in 2018, diverticulitis, GERD, OA, and RLS hospitalized 3/24 for evaluation of left-sided weakness.  MR Brain:Positive for a small 11 mm Acute Infarct in the posterior right  corona radiata tracking toward the insula  PRECAUTIONS: fall WEIGHT BEARING RESTRICTIONS: No  PAIN:  Are you having pain? No  FALLS: Has patient fallen in last 6 months? No  LIVING ENVIRONMENT: Lives with: lives with their family Lives in: House/apartment Stairs:  2 steps  Has following equipment at home: Single point cane, shower chair, and BSC  PLOF: Independent  PATIENT GOALS: to be more independent  OBJECTIVE:  Note: Objective measures were completed at Evaluation unless otherwise noted.  HAND DOMINANCE: Right  ADLs: Overall ADLs: Supervision for basic ADLS Transfers/ambulation related to ADLs:  Tub Shower transfers: supervision, shower seat Equipment: Shower seat with back  IADLs:  Light housekeeping: family performs Meal Prep: has not attempted , cooked previously [employer] mobility: supervision with rollator Medication management: son is Brewing technologist: son is Database administrator: 100% legible  MOBILITY STATUS:  supervision with rollator      UPPER EXTREMITY ROM:  RUE is grossly WFLS  Active ROM Right eval Left eval  Shoulder flexion  100  Shoulder abduction  80  Shoulder adduction    Shoulder extension    Shoulder internal rotation    Shoulder external rotation    Elbow flexion  WFLs  Elbow extension  -10  Wrist  flexion    Wrist extension    Wrist ulnar deviation    Wrist radial deviation    Wrist pronation    Wrist supination    (Blank rows = not tested)  UPPER EXTREMITY MMT:     MMT Right eval Left eval  Shoulder flexion 4-/5 3+/5  Shoulder abduction    Shoulder adduction    Shoulder extension    Shoulder internal rotation    Shoulder external rotation    Middle trapezius    Lower trapezius    Elbow flexion 4/5 3+/5  Elbow extension 4/5 3+/5  Wrist flexion    Wrist extension    Wrist ulnar deviation    Wrist radial deviation    Wrist pronation    Wrist supination    (Blank rows = not tested)  HAND FUNCTION: Grip strength: Right: 28 lbs; Left: 26 lbs  COORDINATION: 9 hole peg test R 28.32, L 32.80 with 1 drop and decreased control   SENSATION: WFL, tingling at times      COGNITION: Overall cognitive status:  hx of aphasia with word finding from inital CVA, increased time for following commands, cognition to addressed  within a functional context   VISION ASSESSMENT:Pt denies changes to vision     OBSERVATIONS: Pleasant female motivated to improve accompanied by her son                                                                                                                             TREATMENT DATE: 07/07/23- eval         PATIENT EDUCATION: Education details: role of OT, potential goals Person educated: Patient and Child(ren) Education method: Explanation Education comprehension: verbalized understanding  HOME EXERCISE PROGRAM: n/a   GOALS: Goals reviewed with patient? Yes  SHORT TERM GOALS: Target date: 08/07/23  I with HEP  Goal status: INITIAL  2.  Pt will increase bilateral grip strength by 4 lbs for increased functional use.  Goal status: INITIAL  3.  Pt will perfrom basic home managemnt with supervision without LOB  Goal status: INITIAL  4.  Pt will perfrom basic cooking with min A   Goal status: INITIAL    LONG TERM  GOALS: Target date: 09/29/23  I with updated HEP  Goal status: INITIAL  2.  Pt will resume home management activities mod I    Goal status: INITIAL  3.  Pt will resume stove top cooking mod I  Goal status: INITIAL  4.  Pt will retrieve a 3 lbs object from eye level shelf with LUE demonstrating good control.  Goal status: INITIAL    ASSESSMENT:  CLINICAL IMPRESSION: Patient is 83 y.o. right-handed female hospitalized 3/24 with hx of remote left MCA distribution cortical stroke presenting with aphasia s/p tPA in 2014 without residual deficit, essential hypertension, hyperlipidemia, NSTEMI in 2018, diverticulitis, GERD, OA, and RLS hospitalized 3/24 for evaluation of left-sided weakness.  MR Brain:Positive for a small 11 mm Acute Infarct in the posterior right  corona radiata tracking toward the insula. Pt presents with the perfromance deficits below. She can benefit from skilled occupational therapy to address these deficits in order to maximize pt's safety and I with ADLs/ IADLs.   PERFORMANCE DEFICITS: in functional skills including ADLs, IADLs, coordination, dexterity, ROM, strength, flexibility, Fine motor control, Gross motor control, mobility, balance, decreased knowledge of precautions, decreased knowledge of use of DME, and UE functional use, cognitive skills including attention, safety awareness, thought, and understand, and psychosocial skills including coping strategies, environmental adaptation, habits, interpersonal interactions, and routines and behaviors.   IMPAIRMENTS: are limiting patient from ADLs, IADLs, play, leisure, and social participation.   CO-MORBIDITIES: may have co-morbidities  that affects occupational performance. Patient will benefit from skilled OT to address above impairments and improve overall function.  MODIFICATION OR ASSISTANCE TO COMPLETE EVALUATION: No modification of tasks or assist necessary to complete an evaluation.  OT OCCUPATIONAL PROFILE  AND HISTORY: Problem focused assessment: Including review of records relating to presenting problem.  CLINICAL DECISION MAKING: LOW - limited treatment options, no task modification necessary  REHAB POTENTIAL: Good  EVALUATION COMPLEXITY: Low  PLAN:  OT FREQUENCY: 2x/week  OT DURATION: 12 weeks POC writtent for 12 weeks to account for scheduling, may d/c sooner based upon progress.  PLANNED INTERVENTIONS: 97168 OT Re-evaluation, 97535 self care/ADL training, [address] therapeutic exercise, 97530 therapeutic activity, 97112 neuromuscular re-education, 97140 manual therapy, 97113 aquatic therapy, 97035 ultrasound, 97018 paraffin, 60454 moist heat, 97010 cryotherapy, 97014 electrical stimulation unattended, 97129 Cognitive training (first 15 min), 09811 Cognitive training(each additional 15 min), passive range of motion, balance training, functional mobility training, psychosocial skills training, energy conservation, coping strategies training, patient/family education, and DME and/or AE instructions  RECOMMENDED OTHER SERVICES: PT  CONSULTED AND AGREED WITH PLAN OF CARE: Patient and family member/caregiver  PLAN FOR NEXT SESSION: inital HEP for coordiantion, cane vs. light theraband   Harvey Lingo, OT 07/09/2023, 11:01 AM

## 2023-07-09 ENCOUNTER — Encounter: Payer: Self-pay | Admitting: Occupational Therapy

## 2023-07-11 ENCOUNTER — Telehealth: Payer: Self-pay | Admitting: Cardiology

## 2023-07-11 NOTE — Therapy (Signed)
 OUTPATIENT PHYSICAL THERAPY NEURO EVALUATION   Patient Name: Aimee Hood MRN: [id number] DOB:1941-04-03, 83 y.o., female Today's Date: 07/12/2023   PCP: Dennis Bast REFERRING PROVIDER: Reymundo Poll   END OF SESSION:  PT End of Session - 07/12/23 1011     Visit Number 1    Date for PT Re-Evaluation 10/04/23    PT Start Time 1012    PT Stop Time 1055    PT Time Calculation (min) 43 min    Activity Tolerance Patient tolerated treatment well             Past Medical History:  Diagnosis Date   Allergy    Arthritis    "all over" (07/10/2013)   CAD (coronary artery disease)    a. normal cors by cath in 2016  b. 08/2016: admitted with an NSTEMI. Cath showed mild nonobstructive CAD with only 20% mid-LAD stenosis. EF was reduced to 35% and wall motion abnormalities were suggestive of a stress-induced cardiomyopathy.   Carpal tunnel syndrome of left wrist    Cerebral aneurysm, nonruptured    3-4 mm in size, stable since 2004 (most recent MRI 2014)   Colon polyp 2007   Diverticulitis    Family history of anesthesia complication    "brother had PONV"   Gastroesophageal reflux disease    High cholesterol    Hypertension    Insomnia    Macular degeneration 08/31/2016   PONV (postoperative nausea and vomiting)    Restless leg syndrome    Stroke [facility]) 02/2013   denies residual on 07/10/2013)   Past Surgical History:  Procedure Laterality Date   BREAST SURGERY     CHOLECYSTECTOMY     JOINT REPLACEMENT     KNEE ARTHROSCOPY Bilateral    LEFT HEART CATH AND CORONARY ANGIOGRAPHY N/A 09/01/2016   Procedure: Left Heart Cath and Coronary Angiography;  Surgeon: Iran Ouch, MD;  Location: [facility] INVASIVE CV LAB;  Service: Cardiovascular;  Laterality: N/A;   LEFT HEART CATHETERIZATION WITH CORONARY ANGIOGRAM N/A 06/20/2014   Procedure: LEFT HEART CATHETERIZATION WITH CORONARY ANGIOGRAM;  Surgeon: Jake Bathe, MD;  Location: [facility] CATH LAB;  Service: Cardiovascular;  Laterality: N/A;    TEE WITHOUT CARDIOVERSION N/A 03/14/2013   Procedure: TRANSESOPHAGEAL ECHOCARDIOGRAM (TEE);  Surgeon: Donato Schultz, MD;  Location: [facility] ENDOSCOPY;  Service: Cardiovascular;  Laterality: N/A;   TOTAL KNEE ARTHROPLASTY Left 07/10/2013   TOTAL KNEE ARTHROPLASTY Left 07/10/2013   Procedure: TOTAL KNEE ARTHROPLASTY;  Surgeon: Valeria Batman, MD;  Location: [facility] OR;  Service: Orthopedics;  Laterality: Left;   VAGINAL HYSTERECTOMY  2000's   Patient Active Problem List   Diagnosis Date Noted   CVA (cerebral vascular accident) (HCC) 07/05/2023   Left-sided weakness 07/04/2023   HLD (hyperlipidemia) 07/04/2023   GAD (generalized anxiety disorder) 07/04/2023   Impingement syndrome of right shoulder 10/28/2021   Carpal tunnel syndrome, bilateral 12/29/2018   Pain in right knee 12/05/2018   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   NSTEMI (non-ST elevated myocardial infarction) (HCC)    Chest pain with moderate risk of acute coronary syndrome 08/31/2016   Normal coronary arteries-2016 08/31/2016   Chest pain on exertion 06/11/2014   Osteoarthritis of left knee 07/12/2013   S/P total knee replacement using cement 07/10/2013   Aneurysm, cerebral, nonruptured 04/23/2013   History of LMCA CVA-TPA 2014 03/09/2013   Osteoarthritis 11/14/2012   Restless legs syndrome 11/14/2012   GERD (gastroesophageal reflux disease) 11/13/2012   Essential hypertension 11/05/2012   Other and unspecified hyperlipidemia 11/05/2012  ONSET DATE: 07/04/23  REFERRING DIAG:  I63.9 (ICD-10-CM) - Cerebrovascular accident (CVA), unspecified mechanism (HCC)      THERAPY DIAG:  Cerebrovascular accident (CVA), unspecified mechanism (HCC)  Other abnormalities of gait and mobility  Unsteadiness on feet  Other lack of coordination  Muscle weakness (generalized)  Rationale for Evaluation and Treatment: Rehabilitation  SUBJECTIVE:                                                                                                                                                                                              SUBJECTIVE STATEMENT: I am alright. No pain. I just don't want to fall.    Pt accompanied by:  son  PERTINENT HISTORY: 07/04/23--Aimee Hood is a 83 y.o. person living with a history of hypertension, hyperlipidemia, GAD, GERD, obesity, vitamin B12 deficiency, vitamin D deficiency, recurrent epistaxis, remote left MCA distribution cortical stroke in 2014 who presented with new left sided weakness and admitted for Stroke workup on hospital day 0. MRIBrain:Positive for a small 11 mm Acute Infarct in the posterior right corona radiata tracking toward the insula   Hx of LMCA in 2014 L knee TKA 2015   PAIN:  Are you having pain? No  PRECAUTIONS: Fall  RED FLAGS: None   WEIGHT BEARING RESTRICTIONS: No  FALLS: Has patient fallen in last 6 months? No  LIVING ENVIRONMENT: Lives with: lives with their son Lives in: House/apartment Stairs: Yes: External: 2 steps; has a ramp that she uses  Has following equipment at home: Single point cane, Walker - 4 wheeled, and Wheelchair (manual)  PLOF: Independent  PATIENT GOALS: just be normal, patient's son states that she gets tired after putting on clothes so it would be a good goal to work on her endurance  OBJECTIVE:  Note: Objective measures were completed at Evaluation unless otherwise noted.  DIAGNOSTIC FINDINGS: CT head code stroke without contrast IMPRESSION: 1. No evidence of an acute intracranial abnormality. 2. Parenchymal atrophy, chronic small vessel ischemic disease and chronic infarcts, as described. 3. Mild generalized cerebral atrophy. 4. Paranasal sinus disease as outlined. Correlate for signs/symptoms of acute sinusitis. 5. Nasal septal defect.   CT Angio Head and neck with and without contrast IMPRESSION: CTA neck:   1. The common carotid and internal carotid arteries are patent within the neck without stenosis or significant  atherosclerotic disease. 2. Venous reflux of contrast partially obscures the left vertebral artery V1 segment. Within this limitation, the vertebral arteries are patent within the neck without stenosis or significant atherosclerotic disease. 3. Aortic Atherosclerosis (ICD10-I70.0).   CTA head:   1. No proximal intracranial large  vessel occlusion identified. 2. Intracranial atherosclerotic disease with multifocal stenoses, most notably as follows. 3. Severe stenosis within the right PCA P2 segment. 4. Severe stenosis within the right PCA P3 segment. 5. Moderate stenosis within the left PCA at the P1/P2 junction. 6. 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     MR Brain IMPRESSION: 1. Positive for a small 11 mm Acute Infarct in the posterior right corona radiata tracking toward the insula. No associated hemorrhage or mass effect. 2. Progressed chronic ischemic disease since a 2020 MRI, which is otherwise most pronounced in the posterior left MCA territory.  COGNITION: Overall cognitive status: Within functional limits for tasks assessed   SENSATION: WFL   POSTURE: rounded shoulders, forward head, and increased thoracic kyphosis  LOWER EXTREMITY ROM:   all WNL   LOWER EXTREMITY MMT:  grossly 4/5 BLE    TRANSFERS: Assistive device utilized: Environmental consultant - 4 wheeled  Sit to stand: Modified independence Stand to sit: Modified independence  GAIT: Gait pattern: step to pattern, decreased stride length, ataxic, narrow BOS, poor foot clearance- Right, and poor foot clearance- Left Distance walked: in clinic distances Assistive device utilized: Environmental consultant - 4 wheeled Level of assistance: Complete Independence   FUNCTIONAL TESTS:  5 times sit to stand: 22.38s Timed up and go (TUG): 26.97s with rollator, 28.32s without AD  Berg Balance Scale: 43/56                                                                                                                              TREATMENT DATE: 07/12/23 EVAL    PATIENT EDUCATION: Education details: POC, HEP Person educated: Patient and Child(ren) Education method: Medical illustrator Education comprehension: verbalized understanding and returned demonstration  HOME EXERCISE PROGRAM: Access Code: [email or web address] URL: [email or web address] Date: 07/12/2023 Prepared by: Cassie Freer  Exercises - Standing Hip Abduction with Counter Support  - 1 x daily - 7 x weekly - 2 sets - 10 reps - Mini Squat with Counter Support  - 1 x daily - 7 x weekly - 2 sets - 10 reps - Standing March with Counter Support  - 1 x daily - 7 x weekly - 2 sets - 10 reps - Standing Tandem Balance with Counter Support  - 1 x daily - 7 x weekly - 3 reps - 30 hold - Standing Single Leg Stance with Counter Support  - 1 x daily - 7 x weekly - 5 reps - 10 hold  GOALS: Goals reviewed with patient? Yes  SHORT TERM GOALS: Target date: 08/23/23  Patient will be independent with initial HEP. Baseline: 07/12/23- given Goal status: INITIAL  2.  Patient will demonstrate decreased fall risk by scoring < 15 sec on TUG with AD. Baseline: 26.97s Goal status: INITIAL   LONG TERM GOALS: Target date: 10/04/23  Patient will be independent with advanced/ongoing HEP to improve outcomes and carryover.  Baseline:  Goal status: INITIAL  2.  Patient will be able to ambulate 300' without AD and good safety to access community.  Baseline: using rollator at eval Goal status: INITIAL  3.  Patient will demonstrate improved functional LE strength as demonstrated by 5xSTS w/o UE push off <15s. Baseline: 22.38s Goal status: INITIAL  4.  Patient will score 51 on Berg Balance test to demonstrate lower risk of falls. (MCID= 8 points) Baseline: 43 Goal status: INITIAL  5.  Patient will demonstrate tandem stance >30s and SLS > 10s independently  Baseline:  Goal status: INITIAL  6.  Patient will demonstrate decreased fall  risk by scoring < 15 sec on TUG without AD. Baseline: 28.32s Goal status: INITIAL  ASSESSMENT:  CLINICAL IMPRESSION: Patient is a 83 y.o. female who was seen today for physical therapy evaluation and treatment for CVA. She is currently ambulating with a rollator but was fully independently prior to the stroke on 07/04/23. With assessments completed today, she does present as a fall risk. She was able to ambulate a few feet without the rollator very carefully and slowly. She would like to be able to return to do her daily 30 min walks. Patient will benefit from skilled PT to address her balance, strength, and gait abnormalities to improve her confidence, increase her functional independence and allow her to return to PLOF.   OBJECTIVE IMPAIRMENTS: Abnormal gait, decreased activity tolerance, decreased balance, decreased endurance, difficulty walking, decreased strength, improper body mechanics, postural dysfunction, and pain.   ACTIVITY LIMITATIONS: squatting, stairs, transfers, and locomotion level  PARTICIPATION LIMITATIONS: cleaning, laundry, and yard work  [facility] POTENTIAL: Good  CLINICAL DECISION MAKING: Stable/uncomplicated  EVALUATION COMPLEXITY: Low  PLAN:  PT FREQUENCY: 2x/week  PT DURATION: 12 weeks  PLANNED INTERVENTIONS: 97110-Therapeutic exercises, 97530- Therapeutic activity, [id number]- Neuromuscular re-education, 97535- Self Care, 13244- Manual therapy, [phone]- Gait training, Patient/Family education, Balance training, Stair training, Cryotherapy, and Moist heat  PLAN FOR NEXT SESSION: functional strengthening and balance training, try to do some walking without rollator    [employer], PT 07/12/2023, 11:05 AM

## 2023-07-11 NOTE — Telephone Encounter (Signed)
 Spoke with son Minerva Areola per [place]. Informed him patient can do PT with monitor. Informed she can do all her normal activities through out the day.

## 2023-07-11 NOTE — Telephone Encounter (Signed)
 Son states patient is supposed to apply heart monitor today, but she also starts PT today. He just wants to be sure it would be alright to begin therapy with monitor. Please advise.

## 2023-07-12 ENCOUNTER — Ambulatory Visit: Attending: Internal Medicine

## 2023-07-12 DIAGNOSIS — M6281 Muscle weakness (generalized): Secondary | ICD-10-CM

## 2023-07-12 DIAGNOSIS — I639 Cerebral infarction, unspecified: Secondary | ICD-10-CM | POA: Diagnosis present

## 2023-07-12 DIAGNOSIS — R2681 Unsteadiness on feet: Secondary | ICD-10-CM | POA: Diagnosis present

## 2023-07-12 DIAGNOSIS — R2689 Other abnormalities of gait and mobility: Secondary | ICD-10-CM | POA: Diagnosis present

## 2023-07-12 DIAGNOSIS — R41840 Attention and concentration deficit: Secondary | ICD-10-CM | POA: Insufficient documentation

## 2023-07-12 DIAGNOSIS — R278 Other lack of coordination: Secondary | ICD-10-CM | POA: Diagnosis present

## 2023-07-12 NOTE — Therapy (Signed)
 OUTPATIENT PHYSICAL THERAPY NEURO TREATMENT   Patient Name: Aimee Hood MRN: [id number] DOB:[birth year], 83 y.o., female Today's Date: 07/13/2023   PCP: Dennis Bast REFERRING PROVIDER: Reymundo Poll   END OF SESSION:  PT End of Session - 07/13/23 1616     Visit Number 2    Date for PT Re-Evaluation 10/04/23    PT Start Time 1615    PT Stop Time 1700    PT Time Calculation (min) 45 min    Activity Tolerance Patient tolerated treatment well              Past Medical History:  Diagnosis Date   Allergy    Arthritis    "all over" (07/10/2013)   CAD (coronary artery disease)    a. normal cors by cath in 2016  b. 08/2016: admitted with an NSTEMI. Cath showed mild nonobstructive CAD with only 20% mid-LAD stenosis. EF was reduced to 35% and wall motion abnormalities were suggestive of a stress-induced cardiomyopathy.   Carpal tunnel syndrome of left wrist    Cerebral aneurysm, nonruptured    3-4 mm in size, stable since 2004 (most recent MRI 2014)   Colon polyp 2007   Diverticulitis    Family history of anesthesia complication    "brother had PONV"   Gastroesophageal reflux disease    High cholesterol    Hypertension    Insomnia    Macular degeneration 08/31/2016   PONV (postoperative nausea and vomiting)    Restless leg syndrome    Stroke [facility]) 02/2013   denies residual on 07/10/2013)   Past Surgical History:  Procedure Laterality Date   BREAST SURGERY     CHOLECYSTECTOMY     JOINT REPLACEMENT     KNEE ARTHROSCOPY Bilateral    LEFT HEART CATH AND CORONARY ANGIOGRAPHY N/A 09/01/2016   Procedure: Left Heart Cath and Coronary Angiography;  Surgeon: Iran Ouch, MD;  Location: [facility] INVASIVE CV LAB;  Service: Cardiovascular;  Laterality: N/A;   LEFT HEART CATHETERIZATION WITH CORONARY ANGIOGRAM N/A 06/20/2014   Procedure: LEFT HEART CATHETERIZATION WITH CORONARY ANGIOGRAM;  Surgeon: Jake Bathe, MD;  Location: [facility] CATH LAB;  Service: Cardiovascular;  Laterality:  N/A;   TEE WITHOUT CARDIOVERSION N/A 03/14/2013   Procedure: TRANSESOPHAGEAL ECHOCARDIOGRAM (TEE);  Surgeon: Donato Schultz, MD;  Location: [facility] ENDOSCOPY;  Service: Cardiovascular;  Laterality: N/A;   TOTAL KNEE ARTHROPLASTY Left 07/10/2013   TOTAL KNEE ARTHROPLASTY Left 07/10/2013   Procedure: TOTAL KNEE ARTHROPLASTY;  Surgeon: Valeria Batman, MD;  Location: [facility] OR;  Service: Orthopedics;  Laterality: Left;   VAGINAL HYSTERECTOMY  2000's   Patient Active Problem List   Diagnosis Date Noted   CVA (cerebral vascular accident) (HCC) 07/05/2023   Left-sided weakness 07/04/2023   HLD (hyperlipidemia) 07/04/2023   GAD (generalized anxiety disorder) 07/04/2023   Impingement syndrome of right shoulder 10/28/2021   Carpal tunnel syndrome, bilateral 12/29/2018   Pain in right knee 12/05/2018   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   NSTEMI (non-ST elevated myocardial infarction) (HCC)    Chest pain with moderate risk of acute coronary syndrome 08/31/2016   Normal coronary arteries-2016 08/31/2016   Chest pain on exertion 06/11/2014   Osteoarthritis of left knee 07/12/2013   S/P total knee replacement using cement 07/10/2013   Aneurysm, cerebral, nonruptured 04/23/2013   History of LMCA CVA-TPA 2014 03/09/2013   Osteoarthritis 11/14/2012   Restless legs syndrome 11/14/2012   GERD (gastroesophageal reflux disease) 11/13/2012   Essential hypertension 11/05/2012   Other and unspecified hyperlipidemia  11/05/2012    ONSET DATE: 07/04/23  REFERRING DIAG:  I63.9 (ICD-10-CM) - Cerebrovascular accident (CVA), unspecified mechanism (HCC)      THERAPY DIAG:  Other lack of coordination  Muscle weakness (generalized)  Unsteadiness on feet  Cerebrovascular accident (CVA), unspecified mechanism (HCC)  Other abnormalities of gait and mobility  Rationale for Evaluation and Treatment: Rehabilitation  SUBJECTIVE:                                                                                                                                                                                              SUBJECTIVE STATEMENT: I did the exercises yesterday, I think they done pretty good. Am trying to practice more walking without the walker.    Pt accompanied by:  son  PERTINENT HISTORY: 07/04/23--Aimee Hood is a 83 y.o. person living with a history of hypertension, hyperlipidemia, GAD, GERD, obesity, vitamin B12 deficiency, vitamin D deficiency, recurrent epistaxis, remote left MCA distribution cortical stroke in 2014 who presented with new left sided weakness and admitted for Stroke workup on hospital day 0. MRIBrain:Positive for a small 11 mm Acute Infarct in the posterior right corona radiata tracking toward the insula   Hx of LMCA in 2014 L knee TKA 2015   PAIN:  Are you having pain? No  PRECAUTIONS: Fall  RED FLAGS: None   WEIGHT BEARING RESTRICTIONS: No  FALLS: Has patient fallen in last 6 months? No  LIVING ENVIRONMENT: Lives with: lives with their son Lives in: House/apartment Stairs: Yes: External: 2 steps; has a ramp that she uses  Has following equipment at home: Single point cane, Walker - 4 wheeled, and Wheelchair (manual)  PLOF: Independent  PATIENT GOALS: just be normal, patient's son states that she gets tired after putting on clothes so it would be a good goal to work on her endurance  OBJECTIVE:  Note: Objective measures were completed at Evaluation unless otherwise noted.  DIAGNOSTIC FINDINGS: CT head code stroke without contrast IMPRESSION: 1. No evidence of an acute intracranial abnormality. 2. Parenchymal atrophy, chronic small vessel ischemic disease and chronic infarcts, as described. 3. Mild generalized cerebral atrophy. 4. Paranasal sinus disease as outlined. Correlate for signs/symptoms of acute sinusitis. 5. Nasal septal defect.   CT Angio Head and neck with and without contrast IMPRESSION: CTA neck:   1. The common carotid and internal  carotid arteries are patent within the neck without stenosis or significant atherosclerotic disease. 2. Venous reflux of contrast partially obscures the left vertebral artery V1 segment. Within this limitation, the vertebral arteries are patent within the neck without stenosis or significant atherosclerotic disease. 3. Aortic  Atherosclerosis (ICD10-I70.0).   CTA head:   1. No proximal intracranial large vessel occlusion identified. 2. Intracranial atherosclerotic disease with multifocal stenoses, most notably as follows. 3. Severe stenosis within the right PCA P2 segment. 4. Severe stenosis within the right PCA P3 segment. 5. Moderate stenosis within the left PCA at the P1/P2 junction. 6. 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     MR Brain IMPRESSION: 1. Positive for a small 11 mm Acute Infarct in the posterior right corona radiata tracking toward the insula. No associated hemorrhage or mass effect. 2. Progressed chronic ischemic disease since a 2020 MRI, which is otherwise most pronounced in the posterior left MCA territory.  COGNITION: Overall cognitive status: Within functional limits for tasks assessed   SENSATION: WFL   POSTURE: rounded shoulders, forward head, and increased thoracic kyphosis  LOWER EXTREMITY ROM:   all WNL   LOWER EXTREMITY MMT:  grossly 4/5 BLE    TRANSFERS: Assistive device utilized: Environmental consultant - 4 wheeled  Sit to stand: Modified independence Stand to sit: Modified independence  GAIT: Gait pattern: step to pattern, decreased stride length, ataxic, narrow BOS, poor foot clearance- Right, and poor foot clearance- Left Distance walked: in clinic distances Assistive device utilized: Environmental consultant - 4 wheeled Level of assistance: Complete Independence   FUNCTIONAL TESTS:  5 times sit to stand: 22.38s Timed up and go (TUG): 26.97s with rollator, 28.32s without AD  Berg Balance Scale: 43/56                                                                                                                              TREATMENT DATE:  07/13/23 NuStep L5x79mins Marching with rollator 2# 2x10 Hip abd with rollator 2# 2x10  LAQ 2# 2x10 HS curls red 2x10 STS 2x10 elevated mat table  Box taps 4"  Step ups 4" Side steps in bars  Side steps over obstacles in bars Horizontal abduction 2x10 for postural strengthening    07/12/23 EVAL    PATIENT EDUCATION: Education details: POC, HEP Person educated: Patient and Child(ren) Education method: Medical illustrator Education comprehension: verbalized understanding and returned demonstration  HOME EXERCISE PROGRAM: Access Code: [email or web address] URL: [email or web address] Date: 07/12/2023 Prepared by: Cassie Freer  Exercises - Standing Hip Abduction with Counter Support  - 1 x daily - 7 x weekly - 2 sets - 10 reps - Mini Squat with Counter Support  - 1 x daily - 7 x weekly - 2 sets - 10 reps - Standing March with Counter Support  - 1 x daily - 7 x weekly - 2 sets - 10 reps - Standing Tandem Balance with Counter Support  - 1 x daily - 7 x weekly - 3 reps - 30 hold - Standing Single Leg Stance with Counter Support  - 1 x daily - 7 x weekly - 5 reps - 10 hold  GOALS: Goals reviewed with patient? Yes  SHORT  TERM GOALS: Target date: 08/23/23  Patient will be independent with initial HEP. Baseline: 07/12/23- given Goal status: INITIAL  2.  Patient will demonstrate decreased fall risk by scoring < 15 sec on TUG with AD. Baseline: 26.97s Goal status: INITIAL   LONG TERM GOALS: Target date: 10/04/23  Patient will be independent with advanced/ongoing HEP to improve outcomes and carryover.  Baseline:  Goal status: INITIAL  2.  Patient will be able to ambulate 300' without AD and good safety to access community.  Baseline: using rollator at eval Goal status: INITIAL  3.  Patient will demonstrate improved functional LE strength as demonstrated  by 5xSTS w/o UE push off <15s. Baseline: 22.38s Goal status: INITIAL  4.  Patient will score 51 on Berg Balance test to demonstrate lower risk of falls. (MCID= 8 points) Baseline: 43 Goal status: INITIAL  5.  Patient will demonstrate tandem stance >30s and SLS > 10s independently  Baseline:  Goal status: INITIAL  6.  Patient will demonstrate decreased fall risk by scoring < 15 sec on TUG without AD. Baseline: 28.32s Goal status: INITIAL  ASSESSMENT:  CLINICAL IMPRESSION: Patient returns for first PT treatment session. We worked on some light strengthening and functional tasks. She tolerates all of it really well. Some difficulty with STS, had to do from elevated mat table bc low surface was too hard. Has some LOB backwards on to her heels. Cues needed for step ups as she sometimes mixed up the sequencing. Does well with step overs in bars. She walked around in the clinic without the walker, although slow, she is safe.    Patient is a 83 y.o. female who was seen today for physical therapy evaluation and treatment for CVA. She is currently ambulating with a rollator but was fully independently prior to the stroke on 07/04/23. With assessments completed today, she does present as a fall risk. She was able to ambulate a few feet without the rollator very carefully and slowly. She would like to be able to return to do her daily 30 min walks. Patient will benefit from skilled PT to address her balance, strength, and gait abnormalities to improve her confidence, increase her functional independence and allow her to return to PLOF.   OBJECTIVE IMPAIRMENTS: Abnormal gait, decreased activity tolerance, decreased balance, decreased endurance, difficulty walking, decreased strength, improper body mechanics, postural dysfunction, and pain.   ACTIVITY LIMITATIONS: squatting, stairs, transfers, and locomotion level  PARTICIPATION LIMITATIONS: cleaning, laundry, and yard work  [facility] POTENTIAL:  Good  CLINICAL DECISION MAKING: Stable/uncomplicated  EVALUATION COMPLEXITY: Low  PLAN:  PT FREQUENCY: 2x/week  PT DURATION: 12 weeks  PLANNED INTERVENTIONS: 97110-Therapeutic exercises, 97530- Therapeutic activity, [id number]- Neuromuscular re-education, 97535- Self Care, 54098- Manual therapy, [phone]- Gait training, Patient/Family education, Balance training, Stair training, Cryotherapy, and Moist heat  PLAN FOR NEXT SESSION: functional strengthening and balance training, try to do some walking without rollator    Cassie Freer, PT 07/13/2023, 5:07 PM

## 2023-07-13 ENCOUNTER — Ambulatory Visit

## 2023-07-13 ENCOUNTER — Encounter: Payer: Self-pay | Admitting: Occupational Therapy

## 2023-07-13 ENCOUNTER — Ambulatory Visit: Admitting: Occupational Therapy

## 2023-07-13 DIAGNOSIS — R278 Other lack of coordination: Secondary | ICD-10-CM

## 2023-07-13 DIAGNOSIS — I639 Cerebral infarction, unspecified: Secondary | ICD-10-CM | POA: Diagnosis not present

## 2023-07-13 DIAGNOSIS — R41840 Attention and concentration deficit: Secondary | ICD-10-CM

## 2023-07-13 DIAGNOSIS — R2681 Unsteadiness on feet: Secondary | ICD-10-CM

## 2023-07-13 DIAGNOSIS — M6281 Muscle weakness (generalized): Secondary | ICD-10-CM

## 2023-07-13 DIAGNOSIS — R2689 Other abnormalities of gait and mobility: Secondary | ICD-10-CM

## 2023-07-13 NOTE — Patient Instructions (Signed)
  Coordination Activities  Perform the following activities for 10 minutes 1 times per day with left hand(s).  Rotate ball in fingertips (clockwise and counter-clockwise). Flip cards 1 at a time as fast as you can. Deal cards with your thumb (Hold deck in hand and push card off top with thumb). Pick up coins and place in container or coin bank. Pick up coins and stack. Pick up coins stack until you get 5-10 in your hand, then move coins from palm to fingertips to stack one at a time.   Focus on controlled movement using left arm evenly with right  ROM: Abduction - Wand   Holding wand with left hand palm up, push wand directly out to side, leading with other hand palm down, until stretch is felt. Hold 5 seconds. Repeat 10 times per set. Do 1-2 sessions per day.      Cane Overhead - Sitting   With arms straight, hold cane forward at waist. Raise cane to eye level. Hold 3 seconds. Repeat 10 times. Do 2 times per day.     ELBOW: Flexion (Cane)    Hold cane with both hands. Bend and straighten elbows. Hold _3__ seconds. No weight   10___ reps per set, _1-2__ sets per day, _5__ days per week  Copyright  VHI. All rights reserved.   SCAPULA: Retraction    Hold cane with both hands. Pinch shoulder blades together. Do not shrug shoulders. Hold _3__ seconds.  _10__ reps per set, 1-2___ sets per day, __5_ days per week  Copyright  VHI. All rights reserved.

## 2023-07-13 NOTE — Therapy (Signed)
 OUTPATIENT OCCUPATIONAL THERAPY NEURO Treatment  Patient Name: Aimee Hood MRN: [id number] DOB:[birth year], 83 y.o., female Today's Date: 07/13/2023  PCP: Dr. Norval Gable PROVIDER:Guiloud MD END OF SESSION:  OT End of Session - 07/13/23 1531     Visit Number 2    Number of Visits 25    Date for OT Re-Evaluation 09/29/23    Authorization Type HTA    Authorization - Visit Number 2    Progress Note Due on Visit 10    OT Start Time 1530    OT Stop Time 1614    OT Time Calculation (min) 44 min              Past Medical History:  Diagnosis Date   Allergy    Arthritis    "all over" (07/10/2013)   CAD (coronary artery disease)    a. normal cors by cath in 2016  b. 08/2016: admitted with an NSTEMI. Cath showed mild nonobstructive CAD with only 20% mid-LAD stenosis. EF was reduced to 35% and wall motion abnormalities were suggestive of a stress-induced cardiomyopathy.   Carpal tunnel syndrome of left wrist    Cerebral aneurysm, nonruptured    3-4 mm in size, stable since 2004 (most recent MRI 2014)   Colon polyp 2007   Diverticulitis    Family history of anesthesia complication    "brother had PONV"   Gastroesophageal reflux disease    High cholesterol    Hypertension    Insomnia    Macular degeneration 08/31/2016   PONV (postoperative nausea and vomiting)    Restless leg syndrome    Stroke [facility]) 02/2013   denies residual on 07/10/2013)   Past Surgical History:  Procedure Laterality Date   BREAST SURGERY     CHOLECYSTECTOMY     JOINT REPLACEMENT     KNEE ARTHROSCOPY Bilateral    LEFT HEART CATH AND CORONARY ANGIOGRAPHY N/A 09/01/2016   Procedure: Left Heart Cath and Coronary Angiography;  Surgeon: Iran Ouch, MD;  Location: [facility] INVASIVE CV LAB;  Service: Cardiovascular;  Laterality: N/A;   LEFT HEART CATHETERIZATION WITH CORONARY ANGIOGRAM N/A 06/20/2014   Procedure: LEFT HEART CATHETERIZATION WITH CORONARY ANGIOGRAM;  Surgeon: Jake Bathe, MD;  Location: [facility]  CATH LAB;  Service: Cardiovascular;  Laterality: N/A;   TEE WITHOUT CARDIOVERSION N/A 03/14/2013   Procedure: TRANSESOPHAGEAL ECHOCARDIOGRAM (TEE);  Surgeon: Donato Schultz, MD;  Location: [facility] ENDOSCOPY;  Service: Cardiovascular;  Laterality: N/A;   TOTAL KNEE ARTHROPLASTY Left 07/10/2013   TOTAL KNEE ARTHROPLASTY Left 07/10/2013   Procedure: TOTAL KNEE ARTHROPLASTY;  Surgeon: Valeria Batman, MD;  Location: [facility] OR;  Service: Orthopedics;  Laterality: Left;   VAGINAL HYSTERECTOMY  2000's   Patient Active Problem List   Diagnosis Date Noted   CVA (cerebral vascular accident) (HCC) 07/05/2023   Left-sided weakness 07/04/2023   HLD (hyperlipidemia) 07/04/2023   GAD (generalized anxiety disorder) 07/04/2023   Impingement syndrome of right shoulder 10/28/2021   Carpal tunnel syndrome, bilateral 12/29/2018   Pain in right knee 12/05/2018   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   NSTEMI (non-ST elevated myocardial infarction) (HCC)    Chest pain with moderate risk of acute coronary syndrome 08/31/2016   Normal coronary arteries-2016 08/31/2016   Chest pain on exertion 06/11/2014   Osteoarthritis of left knee 07/12/2013   S/P total knee replacement using cement 07/10/2013   Aneurysm, cerebral, nonruptured 04/23/2013   History of LMCA CVA-TPA 2014 03/09/2013   Osteoarthritis 11/14/2012   Restless legs syndrome 11/14/2012  GERD (gastroesophageal reflux disease) 11/13/2012   Essential hypertension 11/05/2012   Other and unspecified hyperlipidemia 11/05/2012    ONSET DATE: 07/04/23  REFERRING DIAG: I63.9 (ICD-10-CM) - Cerebrovascular accident (CVA), unspecified mechanism (HCC)  THERAPY DIAG:  Other lack of coordination  Muscle weakness (generalized)  Attention and concentration deficit  Rationale for Evaluation and Treatment: Rehabilitation  SUBJECTIVE:   SUBJECTIVE STATEMENT: Pt reports she wants to walk better Pt accompanied by: family member  PERTINENT HISTORY: 83 y.o. right-handed  female hospitalized 3/24 with hx of remote left MCA distribution cortical stroke presenting with aphasia s/p tPA in 2014 without residual deficit, essential hypertension, hyperlipidemia, NSTEMI in 2018, diverticulitis, GERD, OA, and RLS hospitalized 3/24 for evaluation of left-sided weakness.  MR Brain:Positive for a small 11 mm Acute Infarct in the posterior right  corona radiata tracking toward the insula  PRECAUTIONS: fall WEIGHT BEARING RESTRICTIONS: No  PAIN:  Are you having pain? No  FALLS: Has patient fallen in last 6 months? No  LIVING ENVIRONMENT: Lives with: lives with their family Lives in: House/apartment Stairs:  2 steps  Has following equipment at home: Single point cane, shower chair, and BSC  PLOF: Independent  PATIENT GOALS: to be more independent  OBJECTIVE:  Note: Objective measures were completed at Evaluation unless otherwise noted.  HAND DOMINANCE: Right  ADLs: Overall ADLs: Supervision for basic ADLS Transfers/ambulation related to ADLs:  Tub Shower transfers: supervision, shower seat Equipment: Shower seat with back  IADLs:  Light housekeeping: family performs Meal Prep: has not attempted , cooked previously [employer] mobility: supervision with rollator Medication management: son is Brewing technologist: son is Database administrator: 100% legible  MOBILITY STATUS:  supervision with rollator      UPPER EXTREMITY ROM:  RUE is grossly WFLS  Active ROM Right eval Left eval  Shoulder flexion  100  Shoulder abduction  80  Shoulder adduction    Shoulder extension    Shoulder internal rotation    Shoulder external rotation    Elbow flexion  WFLs  Elbow extension  -10  Wrist flexion    Wrist extension    Wrist ulnar deviation    Wrist radial deviation    Wrist pronation    Wrist supination    (Blank rows = not tested)  UPPER EXTREMITY MMT:     MMT Right eval Left eval  Shoulder flexion 4-/5 3+/5  Shoulder abduction     Shoulder adduction    Shoulder extension    Shoulder internal rotation    Shoulder external rotation    Middle trapezius    Lower trapezius    Elbow flexion 4/5 3+/5  Elbow extension 4/5 3+/5  Wrist flexion    Wrist extension    Wrist ulnar deviation    Wrist radial deviation    Wrist pronation    Wrist supination    (Blank rows = not tested)  HAND FUNCTION: Grip strength: Right: 28 lbs; Left: 26 lbs  COORDINATION: 9 hole peg test R 28.32, L 32.80 with 1 drop and decreased control   SENSATION: WFL, tingling at times      COGNITION: Overall cognitive status:  hx of aphasia with word finding from inital CVA, increased time for following commands, cognition to addressed within a functional context   VISION ASSESSMENT:Pt denies changes to vision     OBSERVATIONS: Pleasant female motivated to improve accompanied by her son  TREATMENT DATE:  07/13/23- fine motor coordination activities: flipping and dealing cards with LUE , stacking pennies then manipulating to place in a container, and rotating ball, min difficulty and v.c Cane exercise HEP issued, 10-15 reps min v.c-See pt education Pt performed yellow theraband exercises for low range shoulder horizontal abduction, biceps curls and triceps extension, 10-15 reps each min v.c and demonstraiton. therpaist did not issue for home. Pt can benefit from review. copying small peg design for LUE fine motor coordination, min difficulty, v.c to avoid compensation.  07/07/23- eval         PATIENT EDUCATION: Education details: inital HEP for coordination and cane exercises Person educated: Patient Education method: Explanation, demonstration, handout, min v.c Education comprehension: verbalized understanding, returned demonstration  HOME EXERCISE PROGRAM: coordination, cane, beginning yellow  theraband   GOALS: Goals reviewed with patient? Yes  SHORT TERM GOALS: Target date: 08/07/23  I with HEP  Goal status: ongoing  2.  Pt will increase bilateral grip strength by 4 lbs for increased functional use.  Goal status: INITIAL  3.  Pt will perfrom basic home management with supervision without LOB  Goal status: INITIAL  4.  Pt will perfrom basic cooking with min A   Goal status: INITIAL    LONG TERM GOALS: Target date: 09/29/23  I with updated HEP  Goal status: INITIAL  2.  Pt will resume home management activities mod I    Goal status: INITIAL  3.  Pt will resume stove top cooking mod I  Goal status: INITIAL  4.  Pt will retrieve a 3 lbs object from eye level shelf with LUE demonstrating good control.  Goal status: INITIAL    ASSESSMENT:  CLINICAL IMPRESSION: Pt is progressing toward goals. She demonstrates understanding of inital HEP.   PERFORMANCE DEFICITS: in functional skills including ADLs, IADLs, coordination, dexterity, ROM, strength, flexibility, Fine motor control, Gross motor control, mobility, balance, decreased knowledge of precautions, decreased knowledge of use of DME, and UE functional use, cognitive skills including attention, safety awareness, thought, and understand, and psychosocial skills including coping strategies, environmental adaptation, habits, interpersonal interactions, and routines and behaviors.   IMPAIRMENTS: are limiting patient from ADLs, IADLs, play, leisure, and social participation.   CO-MORBIDITIES: may have co-morbidities  that affects occupational performance. Patient will benefit from skilled OT to address above impairments and improve overall function.  MODIFICATION OR ASSISTANCE TO COMPLETE EVALUATION: No modification of tasks or assist necessary to complete an evaluation.  OT OCCUPATIONAL PROFILE AND HISTORY: Problem focused assessment: Including review of records relating to presenting problem.  CLINICAL  DECISION MAKING: LOW - limited treatment options, no task modification necessary  REHAB POTENTIAL: Good  EVALUATION COMPLEXITY: Low    PLAN:  OT FREQUENCY: 2x/week  OT DURATION: 12 weeks POC writtent for 12 weeks to account for scheduling, may d/c sooner based upon progress.  PLANNED INTERVENTIONS: 97168 OT Re-evaluation, 97535 self care/ADL training, 40981 therapeutic exercise, 97530 therapeutic activity, 97112 neuromuscular re-education, 97140 manual therapy, 97113 aquatic therapy, 97035 ultrasound, 97018 paraffin, 19147 moist heat, 97010 cryotherapy, 97014 electrical stimulation unattended, 97129 Cognitive training (first 15 min), 82956 Cognitive training(each additional 15 min), passive range of motion, balance training, functional mobility training, psychosocial skills training, energy conservation, coping strategies training, patient/family education, and DME and/or AE instructions  RECOMMENDED OTHER SERVICES: PT  CONSULTED AND AGREED WITH PLAN OF CARE: Patient and family member/caregiver  PLAN FOR NEXT SESSION:  review and issue light theraband, consider putty   Cormac Wint, OT 07/13/2023, 3:32  PM

## 2023-07-18 ENCOUNTER — Ambulatory Visit: Admitting: Occupational Therapy

## 2023-07-18 ENCOUNTER — Encounter: Payer: Self-pay | Admitting: Occupational Therapy

## 2023-07-18 DIAGNOSIS — R278 Other lack of coordination: Secondary | ICD-10-CM

## 2023-07-18 DIAGNOSIS — R2681 Unsteadiness on feet: Secondary | ICD-10-CM

## 2023-07-18 DIAGNOSIS — M6281 Muscle weakness (generalized): Secondary | ICD-10-CM

## 2023-07-18 DIAGNOSIS — I639 Cerebral infarction, unspecified: Secondary | ICD-10-CM | POA: Diagnosis not present

## 2023-07-18 DIAGNOSIS — R2689 Other abnormalities of gait and mobility: Secondary | ICD-10-CM

## 2023-07-18 DIAGNOSIS — R41840 Attention and concentration deficit: Secondary | ICD-10-CM

## 2023-07-18 NOTE — Patient Instructions (Signed)
     Resisted Horizontal Abduction: Bilateral- sit   Sit or stand, tubing in both hands, arms out in front. Keeping arms straight, pinch shoulder blades together and stretch arms out. Repeat _10___ times per set. Do _1-2___ sessions per day, every other day.   Elbow Flexion: Resisted   With tubing held in ______ hand(s) and other end secured under foot, curl arm up as far as possible. Repeat _10___ times per set. Do _1-2___ sessions per day, every other day.    Elbow Extension: Resisted   Sit in chair with resistive band  holding  with other hand and ____one___ elbow bent. Straighten elbow. Repeat _10___ times per set.  Do _1-2___ sessions per day, every other day. Arm: Rowing    Sit facing tubing at chest level, arms outstretched. From a slight forward lean, pulling arms back to chest,  Repeat __10__ times. Do _1___ sessions per day.  Putty-in-Your-Hand    Slowly squeeze putty or a soft rubber ball while breathing normally. Repeat with other hand. Repeat __20__ times. Do __1__ sessions per day.  Finger / Thumb Activities: Extension    Roll putty into rope shape using all fingers held straight. Hitchhike with thumb up and out.  Copyright  VHI. All rights reserved.   Pinch: Palmar    Pinch putty with left thumb and each fingertip in turn. Repeat __20__ times. Do __1_ sessions per day. Activity: Peel fruit such as lemons or oranges.* Peel stickers off surfaces.  Copyright  VHI. All rights reserved.

## 2023-07-18 NOTE — Therapy (Addendum)
 OUTPATIENT OCCUPATIONAL THERAPY NEURO Treatment  Patient Name: Aimee Hood MRN: [id number] DOB:[birth year], 83 y.o., female Today's Date: 07/18/2023  PCP: Dr. Norval Gable PROVIDER:Guiloud MD END OF SESSION:  OT End of Session - 07/18/23 0934     Visit Number 3    Number of Visits 25    Authorization Type HTA    Authorization - Visit Number 3    Progress Note Due on Visit 10    OT Start Time 0933    OT Stop Time 1013    OT Time Calculation (min) 40 min    Activity Tolerance Patient tolerated treatment well    Behavior During Therapy Flat affect              Past Medical History:  Diagnosis Date   Allergy    Arthritis    "all over" (07/10/2013)   CAD (coronary artery disease)    a. normal cors by cath in 2016  b. 08/2016: admitted with an NSTEMI. Cath showed mild nonobstructive CAD with only 20% mid-LAD stenosis. EF was reduced to 35% and wall motion abnormalities were suggestive of a stress-induced cardiomyopathy.   Carpal tunnel syndrome of left wrist    Cerebral aneurysm, nonruptured    3-4 mm in size, stable since 2004 (most recent MRI 2014)   Colon polyp 2007   Diverticulitis    Family history of anesthesia complication    "brother had PONV"   Gastroesophageal reflux disease    High cholesterol    Hypertension    Insomnia    Macular degeneration 08/31/2016   PONV (postoperative nausea and vomiting)    Restless leg syndrome    Stroke [facility]) 02/2013   denies residual on 07/10/2013)   Past Surgical History:  Procedure Laterality Date   BREAST SURGERY     CHOLECYSTECTOMY     JOINT REPLACEMENT     KNEE ARTHROSCOPY Bilateral    LEFT HEART CATH AND CORONARY ANGIOGRAPHY N/A 09/01/2016   Procedure: Left Heart Cath and Coronary Angiography;  Surgeon: Iran Ouch, MD;  Location: [facility] INVASIVE CV LAB;  Service: Cardiovascular;  Laterality: N/A;   LEFT HEART CATHETERIZATION WITH CORONARY ANGIOGRAM N/A 06/20/2014   Procedure: LEFT HEART CATHETERIZATION WITH  CORONARY ANGIOGRAM;  Surgeon: Jake Bathe, MD;  Location: [facility] CATH LAB;  Service: Cardiovascular;  Laterality: N/A;   TEE WITHOUT CARDIOVERSION N/A 03/14/2013   Procedure: TRANSESOPHAGEAL ECHOCARDIOGRAM (TEE);  Surgeon: Donato Schultz, MD;  Location: [facility] ENDOSCOPY;  Service: Cardiovascular;  Laterality: N/A;   TOTAL KNEE ARTHROPLASTY Left 07/10/2013   TOTAL KNEE ARTHROPLASTY Left 07/10/2013   Procedure: TOTAL KNEE ARTHROPLASTY;  Surgeon: Valeria Batman, MD;  Location: [facility] OR;  Service: Orthopedics;  Laterality: Left;   VAGINAL HYSTERECTOMY  2000's   Patient Active Problem List   Diagnosis Date Noted   CVA (cerebral vascular accident) (HCC) 07/05/2023   Left-sided weakness 07/04/2023   HLD (hyperlipidemia) 07/04/2023   GAD (generalized anxiety disorder) 07/04/2023   Impingement syndrome of right shoulder 10/28/2021   Carpal tunnel syndrome, bilateral 12/29/2018   Pain in right knee 12/05/2018   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   NSTEMI (non-ST elevated myocardial infarction) (HCC)    Chest pain with moderate risk of acute coronary syndrome 08/31/2016   Normal coronary arteries-2016 08/31/2016   Chest pain on exertion 06/11/2014   Osteoarthritis of left knee 07/12/2013   S/P total knee replacement using cement 07/10/2013   Aneurysm, cerebral, nonruptured 04/23/2013   History of LMCA CVA-TPA 2014 03/09/2013   Osteoarthritis  11/14/2012   Restless legs syndrome 11/14/2012   GERD (gastroesophageal reflux disease) 11/13/2012   Essential hypertension 11/05/2012   Other and unspecified hyperlipidemia 11/05/2012    ONSET DATE: 07/04/23  REFERRING DIAG: I63.9 (ICD-10-CM) - Cerebrovascular accident (CVA), unspecified mechanism (HCC)  THERAPY DIAG:  No diagnosis found.  Rationale for Evaluation and Treatment: Rehabilitation  SUBJECTIVE:   SUBJECTIVE STATEMENT: Pt denies pain Pt accompanied by: family member  PERTINENT HISTORY: 83 y.o. right-handed female hospitalized 3/24 with hx of  remote left MCA distribution cortical stroke presenting with aphasia s/p tPA in 2014 without residual deficit, essential hypertension, hyperlipidemia, NSTEMI in 2018, diverticulitis, GERD, OA, and RLS hospitalized 3/24 for evaluation of left-sided weakness.  MR Brain:Positive for a small 11 mm Acute Infarct in the posterior right  corona radiata tracking toward the insula  PRECAUTIONS: fall WEIGHT BEARING RESTRICTIONS: No  PAIN:  Are you having pain? No  FALLS: Has patient fallen in last 6 months? No  LIVING ENVIRONMENT: Lives with: lives with their family Lives in: House/apartment Stairs:  2 steps  Has following equipment at home: Single point cane, shower chair, and BSC  PLOF: Independent  PATIENT GOALS: to be more independent  OBJECTIVE:  Note: Objective measures were completed at Evaluation unless otherwise noted.  HAND DOMINANCE: Right  ADLs: Overall ADLs: Supervision for basic ADLS Transfers/ambulation related to ADLs:  Tub Shower transfers: supervision, shower seat Equipment: Shower seat with back  IADLs:  Light housekeeping: family performs Meal Prep: has not attempted , cooked previously [employer] mobility: supervision with rollator Medication management: son is Brewing technologist: son is Database administrator: 100% legible  MOBILITY STATUS:  supervision with rollator      UPPER EXTREMITY ROM:  RUE is grossly WFLS  Active ROM Right eval Left eval  Shoulder flexion  100  Shoulder abduction  80  Shoulder adduction    Shoulder extension    Shoulder internal rotation    Shoulder external rotation    Elbow flexion  WFLs  Elbow extension  -10  Wrist flexion    Wrist extension    Wrist ulnar deviation    Wrist radial deviation    Wrist pronation    Wrist supination    (Blank rows = not tested)  UPPER EXTREMITY MMT:     MMT Right eval Left eval  Shoulder flexion 4-/5 3+/5  Shoulder abduction    Shoulder adduction    Shoulder  extension    Shoulder internal rotation    Shoulder external rotation    Middle trapezius    Lower trapezius    Elbow flexion 4/5 3+/5  Elbow extension 4/5 3+/5  Wrist flexion    Wrist extension    Wrist ulnar deviation    Wrist radial deviation    Wrist pronation    Wrist supination    (Blank rows = not tested)  HAND FUNCTION: Grip strength: Right: 28 lbs; Left: 26 lbs  COORDINATION: 9 hole peg test R 28.32, L 32.80 with 1 drop and decreased control   SENSATION: WFL, tingling at times      COGNITION: Overall cognitive status:  hx of aphasia with word finding from inital CVA, increased time for following commands, cognition to addressed within a functional context   VISION ASSESSMENT:Pt denies changes to vision     OBSERVATIONS: Pleasant female motivated to improve accompanied by her son  TREATMENT DATE:  07/18/23-Placing metal pegs 3 different sizes with in hand manipulation then removing for increased fine motor coordiantion, min difficulty, v.c Pt was instructed in beginning yellow theraband HEP, and red putty HEP, see pt instructions.  07/13/23- fine motor coordination activities: flipping and dealing cards with LUE , stacking pennies then manipulating to place in a container, and rotating ball, min difficulty and v.c Cane exercise HEP issued, 10-15 reps min v.c-See pt education Pt performed yellow theraband exercises for low range shoulder horizontal abduction, biceps curls and triceps extension, 10-15 reps each min v.c and demonstration. Therapist did not issue for home. Pt can benefit from review. copying small peg design for LUE fine motor coordination, min difficulty, v.c to avoid compensation.  07/07/23- eval         PATIENT EDUCATION: Education details:red putty HEP, yellow theraband HEP, min v.c and demonstration, 10-15 reps each   Person educated: Patient Education method: Explanation, demonstration, handout, min v.c Education comprehension: verbalized understanding, returned demonstration  HOME EXERCISE PROGRAM: coordination, cane, beginning yellow theraband   GOALS: Goals reviewed with patient? Yes  SHORT TERM GOALS: Target date: 08/07/23  I with HEP  Goal status: ongoing  2.  Pt will increase bilateral grip strength by 4 lbs for increased functional use.  Goal status: INITIAL  3.  Pt will perfrom basic home management with supervision without LOB  Goal status: INITIAL  4.  Pt will perfrom basic cooking with min A   Goal status: INITIAL    LONG TERM GOALS: Target date: 09/29/23  I with updated HEP  Goal status: INITIAL  2.  Pt will resume home management activities mod I    Goal status: INITIAL  3.  Pt will resume stove top cooking mod I  Goal status: INITIAL  4.  Pt will retrieve a 3 lbs object from eye level shelf with LUE demonstrating good control.  Goal status: INITIAL    ASSESSMENT:  CLINICAL IMPRESSION: Pt is progressing toward goals. She demonstrates understanding of theraband and red putty HEP. Pt arrived using a cane. Therapist spoke with PT. Per PT pt should use rollator in the community for safety, and she can use cane at home. This was discussed with pt/ son.   PERFORMANCE DEFICITS: in functional skills including ADLs, IADLs, coordination, dexterity, ROM, strength, flexibility, Fine motor control, Gross motor control, mobility, balance, decreased knowledge of precautions, decreased knowledge of use of DME, and UE functional use, cognitive skills including attention, safety awareness, thought, and understand, and psychosocial skills including coping strategies, environmental adaptation, habits, interpersonal interactions, and routines and behaviors.   IMPAIRMENTS: are limiting patient from ADLs, IADLs, play, leisure, and social participation.   CO-MORBIDITIES: may have  co-morbidities  that affects occupational performance. Patient will benefit from skilled OT to address above impairments and improve overall function.  MODIFICATION OR ASSISTANCE TO COMPLETE EVALUATION: No modification of tasks or assist necessary to complete an evaluation.  OT OCCUPATIONAL PROFILE AND HISTORY: Problem focused assessment: Including review of records relating to presenting problem.  CLINICAL DECISION MAKING: LOW - limited treatment options, no task modification necessary  REHAB POTENTIAL: Good  EVALUATION COMPLEXITY: Low    PLAN:  OT FREQUENCY: 2x/week  OT DURATION: 12 weeks POC writtent for 12 weeks to account for scheduling, may d/c sooner based upon progress.  PLANNED INTERVENTIONS: 97168 OT Re-evaluation, 97535 self care/ADL training, 59563 therapeutic exercise, 97530 therapeutic activity, 97112 neuromuscular re-education, 97140 manual therapy, [id number] aquatic therapy, 97035 ultrasound, 97018 paraffin, 87564 moist heat,  97010 cryotherapy, 97014 electrical stimulation unattended, 96295 Cognitive training (first 15 min), 97130 Cognitive training(each additional 15 min), passive range of motion, balance training, functional mobility training, psychosocial skills training, energy conservation, coping strategies training, patient/family education, and DME and/or AE instructions  RECOMMENDED OTHER SERVICES: PT  CONSULTED AND AGREED WITH PLAN OF CARE: Patient and family member/caregiver  PLAN FOR NEXT SESSION: check on/ reveiw HEP's, coordination activities, simulated ADL/IADLS   Celvin Taney, OT 07/18/2023, 9:35 AM

## 2023-07-19 ENCOUNTER — Ambulatory Visit: Admitting: Physical Therapy

## 2023-07-19 ENCOUNTER — Encounter: Payer: Self-pay | Admitting: Physical Therapy

## 2023-07-19 DIAGNOSIS — M6281 Muscle weakness (generalized): Secondary | ICD-10-CM

## 2023-07-19 DIAGNOSIS — R2681 Unsteadiness on feet: Secondary | ICD-10-CM

## 2023-07-19 DIAGNOSIS — I639 Cerebral infarction, unspecified: Secondary | ICD-10-CM | POA: Diagnosis not present

## 2023-07-19 DIAGNOSIS — R278 Other lack of coordination: Secondary | ICD-10-CM

## 2023-07-19 NOTE — Therapy (Signed)
 OUTPATIENT PHYSICAL THERAPY NEURO TREATMENT   Patient Name: Aimee Hood MRN: [id number] DOB:[birth year], 83 y.o., female Today's Date: 07/19/2023   PCP: Dennis Bast REFERRING PROVIDER: Reymundo Poll   END OF SESSION:  PT End of Session - 07/19/23 1523     Visit Number 3    Date for PT Re-Evaluation 10/04/23    PT Start Time 1517    PT Stop Time 1555    PT Time Calculation (min) 38 min    Activity Tolerance Patient tolerated treatment well    Behavior During Therapy Flat affect               Past Medical History:  Diagnosis Date   Allergy    Arthritis    "all over" (07/10/2013)   CAD (coronary artery disease)    a. normal cors by cath in 2016  b. 08/2016: admitted with an NSTEMI. Cath showed mild nonobstructive CAD with only 20% mid-LAD stenosis. EF was reduced to 35% and wall motion abnormalities were suggestive of a stress-induced cardiomyopathy.   Carpal tunnel syndrome of left wrist    Cerebral aneurysm, nonruptured    3-4 mm in size, stable since 2004 (most recent MRI 2014)   Colon polyp 2007   Diverticulitis    Family history of anesthesia complication    "brother had PONV"   Gastroesophageal reflux disease    High cholesterol    Hypertension    Insomnia    Macular degeneration 08/31/2016   PONV (postoperative nausea and vomiting)    Restless leg syndrome    Stroke [facility]) 02/2013   denies residual on 07/10/2013)   Past Surgical History:  Procedure Laterality Date   BREAST SURGERY     CHOLECYSTECTOMY     JOINT REPLACEMENT     KNEE ARTHROSCOPY Bilateral    LEFT HEART CATH AND CORONARY ANGIOGRAPHY N/A 09/01/2016   Procedure: Left Heart Cath and Coronary Angiography;  Surgeon: Iran Ouch, MD;  Location: [facility] INVASIVE CV LAB;  Service: Cardiovascular;  Laterality: N/A;   LEFT HEART CATHETERIZATION WITH CORONARY ANGIOGRAM N/A 06/20/2014   Procedure: LEFT HEART CATHETERIZATION WITH CORONARY ANGIOGRAM;  Surgeon: Jake Bathe, MD;  Location: [facility] CATH LAB;   Service: Cardiovascular;  Laterality: N/A;   TEE WITHOUT CARDIOVERSION N/A 03/14/2013   Procedure: TRANSESOPHAGEAL ECHOCARDIOGRAM (TEE);  Surgeon: Donato Schultz, MD;  Location: [facility] ENDOSCOPY;  Service: Cardiovascular;  Laterality: N/A;   TOTAL KNEE ARTHROPLASTY Left 07/10/2013   TOTAL KNEE ARTHROPLASTY Left 07/10/2013   Procedure: TOTAL KNEE ARTHROPLASTY;  Surgeon: Valeria Batman, MD;  Location: [facility] OR;  Service: Orthopedics;  Laterality: Left;   VAGINAL HYSTERECTOMY  2000's   Patient Active Problem List   Diagnosis Date Noted   CVA (cerebral vascular accident) (HCC) 07/05/2023   Left-sided weakness 07/04/2023   HLD (hyperlipidemia) 07/04/2023   GAD (generalized anxiety disorder) 07/04/2023   Impingement syndrome of right shoulder 10/28/2021   Carpal tunnel syndrome, bilateral 12/29/2018   Pain in right knee 12/05/2018   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   NSTEMI (non-ST elevated myocardial infarction) (HCC)    Chest pain with moderate risk of acute coronary syndrome 08/31/2016   Normal coronary arteries-2016 08/31/2016   Chest pain on exertion 06/11/2014   Osteoarthritis of left knee 07/12/2013   S/P total knee replacement using cement 07/10/2013   Aneurysm, cerebral, nonruptured 04/23/2013   History of LMCA CVA-TPA 2014 03/09/2013   Osteoarthritis 11/14/2012   Restless legs syndrome 11/14/2012   GERD (gastroesophageal reflux disease) 11/13/2012  Essential hypertension 11/05/2012   Other and unspecified hyperlipidemia 11/05/2012    ONSET DATE: 07/04/23  REFERRING DIAG:  I63.9 (ICD-10-CM) - Cerebrovascular accident (CVA), unspecified mechanism (HCC)      THERAPY DIAG:  Other lack of coordination  Muscle weakness (generalized)  Unsteadiness on feet  Rationale for Evaluation and Treatment: Rehabilitation  SUBJECTIVE:                                                                                                                                                                                              SUBJECTIVE STATEMENT:    Nothing new today, doing well. Up for anything today    Pt accompanied by:  son  PERTINENT HISTORY: 07/04/23--Aimee Hood is a 83 y.o. person living with a history of hypertension, hyperlipidemia, GAD, GERD, obesity, vitamin B12 deficiency, vitamin D deficiency, recurrent epistaxis, remote left MCA distribution cortical stroke in 2014 who presented with new left sided weakness and admitted for Stroke workup on hospital day 0. MRIBrain:Positive for a small 11 mm Acute Infarct in the posterior right corona radiata tracking toward the insula   Hx of LMCA in 2014 L knee TKA 2015   PAIN:  Are you having pain? No 0/10  PRECAUTIONS: Fall  RED FLAGS: None   WEIGHT BEARING RESTRICTIONS: No  FALLS: Has patient fallen in last 6 months? No  LIVING ENVIRONMENT: Lives with: lives with their son Lives in: House/apartment Stairs: Yes: External: 2 steps; has a ramp that she uses  Has following equipment at home: Single point cane, Walker - 4 wheeled, and Wheelchair (manual)  PLOF: Independent  PATIENT GOALS: just be normal, patient's son states that she gets tired after putting on clothes so it would be a good goal to work on her endurance  OBJECTIVE:  Note: Objective measures were completed at Evaluation unless otherwise noted.  DIAGNOSTIC FINDINGS: CT head code stroke without contrast IMPRESSION: 1. No evidence of an acute intracranial abnormality. 2. Parenchymal atrophy, chronic small vessel ischemic disease and chronic infarcts, as described. 3. Mild generalized cerebral atrophy. 4. Paranasal sinus disease as outlined. Correlate for signs/symptoms of acute sinusitis. 5. Nasal septal defect.   CT Angio Head and neck with and without contrast IMPRESSION: CTA neck:   1. The common carotid and internal carotid arteries are patent within the neck without stenosis or significant atherosclerotic disease. 2. Venous reflux of  contrast partially obscures the left vertebral artery V1 segment. Within this limitation, the vertebral arteries are patent within the neck without stenosis or significant atherosclerotic disease. 3. Aortic Atherosclerosis (ICD10-I70.0).   CTA head:   1. No proximal intracranial  large vessel occlusion identified. 2. Intracranial atherosclerotic disease with multifocal stenoses, most notably as follows. 3. Severe stenosis within the right PCA P2 segment. 4. Severe stenosis within the right PCA P3 segment. 5. Moderate stenosis within the left PCA at the P1/P2 junction. 6. 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     MR Brain IMPRESSION: 1. Positive for a small 11 mm Acute Infarct in the posterior right corona radiata tracking toward the insula. No associated hemorrhage or mass effect. 2. Progressed chronic ischemic disease since a 2020 MRI, which is otherwise most pronounced in the posterior left MCA territory.  COGNITION: Overall cognitive status: Within functional limits for tasks assessed   SENSATION: WFL   POSTURE: rounded shoulders, forward head, and increased thoracic kyphosis  LOWER EXTREMITY ROM:   all WNL   LOWER EXTREMITY MMT:  grossly 4/5 BLE    TRANSFERS: Assistive device utilized: Environmental consultant - 4 wheeled  Sit to stand: Modified independence Stand to sit: Modified independence  GAIT: Gait pattern: step to pattern, decreased stride length, ataxic, narrow BOS, poor foot clearance- Right, and poor foot clearance- Left Distance walked: in clinic distances Assistive device utilized: Environmental consultant - 4 wheeled Level of assistance: Complete Independence   FUNCTIONAL TESTS:  5 times sit to stand: 22.38s Timed up and go (TUG): 26.97s with rollator, 28.32s without AD  Berg Balance Scale: 43/56                                                                                                                             TREATMENT DATE:    07/19/23  Nustep L5x8 minutes all four extremities for w/u and neural priming STS with 3# x10 no UEs  Weighted rollator pushes for endurance/strength: 226ft x2 with 22#, then 277ft with 36# Forward step ups 4 inch step x12 B   Gait no device close S x258ft  Multiple rounds of gait without walker between chair and // bars   Tandem stance blue foam pad 2x30 seconds B  Tandem gait in // bars solid surface forward and backwards x4 rounds   07/13/23 NuStep L5x43mins Marching with rollator 2# 2x10 Hip abd with rollator 2# 2x10  LAQ 2# 2x10 HS curls red 2x10 STS 2x10 elevated mat table  Box taps 4"  Step ups 4" Side steps in bars  Side steps over obstacles in bars Horizontal abduction 2x10 for postural strengthening    07/12/23 EVAL    PATIENT EDUCATION: Education details: POC, HEP Person educated: Patient and Child(ren) Education method: Medical illustrator Education comprehension: verbalized understanding and returned demonstration  HOME EXERCISE PROGRAM: Access Code: [email or web address] URL: [email or web address] Date: 07/12/2023 Prepared by: Cassie Freer  Exercises - Standing Hip Abduction with Counter Support  - 1 x daily - 7 x weekly - 2 sets - 10 reps - Mini Squat with Counter Support  - 1 x daily - 7 x weekly - 2 sets - 10 reps -  Standing March with Counter Support  - 1 x daily - 7 x weekly - 2 sets - 10 reps - Standing Tandem Balance with Counter Support  - 1 x daily - 7 x weekly - 3 reps - 30 hold - Standing Single Leg Stance with Counter Support  - 1 x daily - 7 x weekly - 5 reps - 10 hold  GOALS: Goals reviewed with patient? Yes  SHORT TERM GOALS: Target date: 08/23/23  Patient will be independent with initial HEP. Baseline: 07/12/23- given Goal status: INITIAL  2.  Patient will demonstrate decreased fall risk by scoring < 15 sec on TUG with AD. Baseline: 26.97s Goal status: INITIAL   LONG TERM GOALS: Target date: 10/04/23  Patient will  be independent with advanced/ongoing HEP to improve outcomes and carryover.  Baseline:  Goal status: INITIAL  2.  Patient will be able to ambulate 300' without AD and good safety to access community.  Baseline: using rollator at eval Goal status: INITIAL  3.  Patient will demonstrate improved functional LE strength as demonstrated by 5xSTS w/o UE push off <15s. Baseline: 22.38s Goal status: INITIAL  4.  Patient will score 51 on Berg Balance test to demonstrate lower risk of falls. (MCID= 8 points) Baseline: 43 Goal status: INITIAL  5.  Patient will demonstrate tandem stance >30s and SLS > 10s independently  Baseline:  Goal status: INITIAL  6.  Patient will demonstrate decreased fall risk by scoring < 15 sec on TUG without AD. Baseline: 28.32s Goal status: INITIAL  ASSESSMENT:  CLINICAL IMPRESSION:  Continued working on a good mix of functional activity tolerance, strength, and balance training today as per POC. Provided rest breaks PRN, it does seem that her endurance is already starting to make some decent progress as compared to initial eval. Will continue to challenge her.    Patient is a 83 y.o. female who was seen today for physical therapy evaluation and treatment for CVA. She is currently ambulating with a rollator but was fully independently prior to the stroke on 07/04/23. With assessments completed today, she does present as a fall risk. She was able to ambulate a few feet without the rollator very carefully and slowly. She would like to be able to return to do her daily 30 min walks. Patient will benefit from skilled PT to address her balance, strength, and gait abnormalities to improve her confidence, increase her functional independence and allow her to return to PLOF.   OBJECTIVE IMPAIRMENTS: Abnormal gait, decreased activity tolerance, decreased balance, decreased endurance, difficulty walking, decreased strength, improper body mechanics, postural dysfunction, and pain.    ACTIVITY LIMITATIONS: squatting, stairs, transfers, and locomotion level  PARTICIPATION LIMITATIONS: cleaning, laundry, and yard work  [facility] POTENTIAL: Good  CLINICAL DECISION MAKING: Stable/uncomplicated  EVALUATION COMPLEXITY: Low  PLAN:  PT FREQUENCY: 2x/week  PT DURATION: 12 weeks  PLANNED INTERVENTIONS: 97110-Therapeutic exercises, 97530- Therapeutic activity, [id number]- Neuromuscular re-education, 97535- Self Care, [id number]- Manual therapy, [id number]- Gait training, Patient/Family education, Balance training, Stair training, Cryotherapy, and Moist heat  PLAN FOR NEXT SESSION: functional strengthening and balance training, try to do some walking without rollator, challenge endurance   Nedra Hai, PT, DPT 07/19/23 3:57 PM

## 2023-07-20 ENCOUNTER — Ambulatory Visit: Admitting: Occupational Therapy

## 2023-07-20 DIAGNOSIS — R2681 Unsteadiness on feet: Secondary | ICD-10-CM

## 2023-07-20 DIAGNOSIS — R278 Other lack of coordination: Secondary | ICD-10-CM

## 2023-07-20 DIAGNOSIS — R2689 Other abnormalities of gait and mobility: Secondary | ICD-10-CM

## 2023-07-20 DIAGNOSIS — M6281 Muscle weakness (generalized): Secondary | ICD-10-CM

## 2023-07-20 DIAGNOSIS — R41840 Attention and concentration deficit: Secondary | ICD-10-CM

## 2023-07-20 DIAGNOSIS — I639 Cerebral infarction, unspecified: Secondary | ICD-10-CM | POA: Diagnosis not present

## 2023-07-20 NOTE — Therapy (Signed)
 OUTPATIENT OCCUPATIONAL THERAPY NEURO Treatment  Patient Name: Aimee Hood MRN: [id number] DOB:[birth year], 83 y.o., female Today's Date: 07/20/2023  PCP: Dr. Norval Gable PROVIDER:Guiloud MD END OF SESSION:  OT End of Session - 07/20/23 1411     Visit Number 4    Date for OT Re-Evaluation 09/29/23    Authorization - Visit Number 4    Progress Note Due on Visit 10    OT Start Time 1402    OT Stop Time 1442    OT Time Calculation (min) 40 min              Past Medical History:  Diagnosis Date   Allergy    Arthritis    "all over" (07/10/2013)   CAD (coronary artery disease)    a. normal cors by cath in 2016  b. 08/2016: admitted with an NSTEMI. Cath showed mild nonobstructive CAD with only 20% mid-LAD stenosis. EF was reduced to 35% and wall motion abnormalities were suggestive of a stress-induced cardiomyopathy.   Carpal tunnel syndrome of left wrist    Cerebral aneurysm, nonruptured    3-4 mm in size, stable since 2004 (most recent MRI 2014)   Colon polyp 2007   Diverticulitis    Family history of anesthesia complication    "brother had PONV"   Gastroesophageal reflux disease    High cholesterol    Hypertension    Insomnia    Macular degeneration 08/31/2016   PONV (postoperative nausea and vomiting)    Restless leg syndrome    Stroke [facility]) 02/2013   denies residual on 07/10/2013)   Past Surgical History:  Procedure Laterality Date   BREAST SURGERY     CHOLECYSTECTOMY     JOINT REPLACEMENT     KNEE ARTHROSCOPY Bilateral    LEFT HEART CATH AND CORONARY ANGIOGRAPHY N/A 09/01/2016   Procedure: Left Heart Cath and Coronary Angiography;  Surgeon: Iran Ouch, MD;  Location: [facility] INVASIVE CV LAB;  Service: Cardiovascular;  Laterality: N/A;   LEFT HEART CATHETERIZATION WITH CORONARY ANGIOGRAM N/A 06/20/2014   Procedure: LEFT HEART CATHETERIZATION WITH CORONARY ANGIOGRAM;  Surgeon: Jake Bathe, MD;  Location: [facility] CATH LAB;  Service: Cardiovascular;  Laterality:  N/A;   TEE WITHOUT CARDIOVERSION N/A 03/14/2013   Procedure: TRANSESOPHAGEAL ECHOCARDIOGRAM (TEE);  Surgeon: Donato Schultz, MD;  Location: [facility] ENDOSCOPY;  Service: Cardiovascular;  Laterality: N/A;   TOTAL KNEE ARTHROPLASTY Left 07/10/2013   TOTAL KNEE ARTHROPLASTY Left 07/10/2013   Procedure: TOTAL KNEE ARTHROPLASTY;  Surgeon: Valeria Batman, MD;  Location: [facility] OR;  Service: Orthopedics;  Laterality: Left;   VAGINAL HYSTERECTOMY  2000's   Patient Active Problem List   Diagnosis Date Noted   CVA (cerebral vascular accident) (HCC) 07/05/2023   Left-sided weakness 07/04/2023   HLD (hyperlipidemia) 07/04/2023   GAD (generalized anxiety disorder) 07/04/2023   Impingement syndrome of right shoulder 10/28/2021   Carpal tunnel syndrome, bilateral 12/29/2018   Pain in right knee 12/05/2018   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   NSTEMI (non-ST elevated myocardial infarction) (HCC)    Chest pain with moderate risk of acute coronary syndrome 08/31/2016   Normal coronary arteries-2016 08/31/2016   Chest pain on exertion 06/11/2014   Osteoarthritis of left knee 07/12/2013   S/P total knee replacement using cement 07/10/2013   Aneurysm, cerebral, nonruptured 04/23/2013   History of LMCA CVA-TPA 2014 03/09/2013   Osteoarthritis 11/14/2012   Restless legs syndrome 11/14/2012   GERD (gastroesophageal reflux disease) 11/13/2012   Essential hypertension 11/05/2012   Other  and unspecified hyperlipidemia 11/05/2012    ONSET DATE: 07/04/23  REFERRING DIAG: I63.9 (ICD-10-CM) - Cerebrovascular accident (CVA), unspecified mechanism (HCC)  THERAPY DIAG:  No diagnosis found.  Rationale for Evaluation and Treatment: Rehabilitation  SUBJECTIVE:   SUBJECTIVE STATEMENT: Pt  reports using morcrowave at home Pt accompanied by: family member  PERTINENT HISTORY: 83 y.o. right-handed female hospitalized 3/24 with hx of remote left MCA distribution cortical stroke presenting with aphasia s/p tPA in 2014  without residual deficit, essential hypertension, hyperlipidemia, NSTEMI in 2018, diverticulitis, GERD, OA, and RLS hospitalized 3/24 for evaluation of left-sided weakness.  MR Brain:Positive for a small 11 mm Acute Infarct in the posterior right  corona radiata tracking toward the insula  PRECAUTIONS: fall WEIGHT BEARING RESTRICTIONS: No  PAIN:  Are you having pain? No  FALLS: Has patient fallen in last 6 months? No  LIVING ENVIRONMENT: Lives with: lives with their family Lives in: House/apartment Stairs:  2 steps  Has following equipment at home: Single point cane, shower chair, and BSC  PLOF: Independent  PATIENT GOALS: to be more independent  OBJECTIVE:  Note: Objective measures were completed at Evaluation unless otherwise noted.  HAND DOMINANCE: Right  ADLs: Overall ADLs: Supervision for basic ADLS Transfers/ambulation related to ADLs:  Tub Shower transfers: supervision, shower seat Equipment: Shower seat with back  IADLs:  Light housekeeping: family performs Meal Prep: has not attempted , cooked previously [employer] mobility: supervision with rollator Medication management: son is Brewing technologist: son is Database administrator: 100% legible  MOBILITY STATUS:  supervision with rollator      UPPER EXTREMITY ROM:  RUE is grossly WFLS  Active ROM Right eval Left eval  Shoulder flexion  100  Shoulder abduction  80  Shoulder adduction    Shoulder extension    Shoulder internal rotation    Shoulder external rotation    Elbow flexion  WFLs  Elbow extension  -10  Wrist flexion    Wrist extension    Wrist ulnar deviation    Wrist radial deviation    Wrist pronation    Wrist supination    (Blank rows = not tested)  UPPER EXTREMITY MMT:     MMT Right eval Left eval  Shoulder flexion 4-/5 3+/5  Shoulder abduction    Shoulder adduction    Shoulder extension    Shoulder internal rotation    Shoulder external rotation    Middle  trapezius    Lower trapezius    Elbow flexion 4/5 3+/5  Elbow extension 4/5 3+/5  Wrist flexion    Wrist extension    Wrist ulnar deviation    Wrist radial deviation    Wrist pronation    Wrist supination    (Blank rows = not tested)  HAND FUNCTION: Grip strength: Right: 28 lbs; Left: 26 lbs  COORDINATION: 9 hole peg test R 28.32, L 32.80 with 1 drop and decreased control   SENSATION: WFL, tingling at times      COGNITION: Overall cognitive status:  hx of aphasia with word finding from inital CVA, increased time for following commands, cognition to addressed within a functional context   VISION ASSESSMENT:Pt denies changes to vision     OBSERVATIONS: Pleasant female motivated to improve accompanied by her son  TREATMENT DATE:  07/20/23-UBE x 6 mins level 1 for conditioning Reviewed yellow theraband HEP 2 sets of 10 reps each, min v.c Standing for dynamic reaching using LUE to place graded clothepins (1-8#) for sutained pinch and functional reach, with supervision, no LOB Standing to copy small peg design on vertical surface with LUE for fine motor coordiantion and standing tolerance, min drops, no LOB.  07/18/23-Placing metal pegs 3 different sizes with in hand manipulation then removing for increased fine motor coordiantion, min difficulty, v.c Pt was instructed in beginning yellow theraband HEP, and red putty HEP, see pt instructions.  07/13/23- fine motor coordination activities: flipping and dealing cards with LUE , stacking pennies then manipulating to place in a container, and rotating ball, min difficulty and v.c Cane exercise HEP issued, 10-15 reps min v.c-See pt education Pt performed yellow theraband exercises for low range shoulder horizontal abduction, biceps curls and triceps extension, 10-15 reps each min v.c and demonstration. Therapist  did not issue for home. Pt can benefit from review. copying small peg design for LUE fine motor coordination, min difficulty, v.c to avoid compensation.  07/07/23- eval         PATIENT EDUCATION: Education details:red putty HEP, yellow theraband HEP, min v.c and demonstration, 10-15 reps each  Person educated: Patient Education method: Explanation, demonstration, handout, min v.c Education comprehension: verbalized understanding, returned demonstration  HOME EXERCISE PROGRAM: coordination, cane, beginning yellow theraband   GOALS: Goals reviewed with patient? Yes  SHORT TERM GOALS: Target date: 08/07/23  I with HEP  Goal status: ongoing  2.  Pt will increase bilateral grip strength by 4 lbs for increased functional use.  Goal status: ongoing  3.  Pt will perfrom basic home management with supervision without LOB  Goal status: INITIAL  4.  Pt will perfrom basic cooking with min A   Goal status: ongoing, pt reports using mircrowave    LONG TERM GOALS: Target date: 09/29/23  I with updated HEP  Goal status: INITIAL  2.  Pt will resume home management activities mod I    Goal status: INITIAL  3.  Pt will resume stove top cooking mod I  Goal status: INITIAL  4.  Pt will retrieve a 3 lbs object from eye level shelf with LUE demonstrating good control.  Goal status: INITIAL    ASSESSMENT:  CLINICAL IMPRESSION: Pt is progressing toward goals. She demonstrates improving coordination and standing balance. PERFORMANCE DEFICITS: in functional skills including ADLs, IADLs, coordination, dexterity, ROM, strength, flexibility, Fine motor control, Gross motor control, mobility, balance, decreased knowledge of precautions, decreased knowledge of use of DME, and UE functional use, cognitive skills including attention, safety awareness, thought, and understand, and psychosocial skills including coping strategies, environmental adaptation, habits, interpersonal  interactions, and routines and behaviors.   IMPAIRMENTS: are limiting patient from ADLs, IADLs, play, leisure, and social participation.   CO-MORBIDITIES: may have co-morbidities  that affects occupational performance. Patient will benefit from skilled OT to address above impairments and improve overall function.  MODIFICATION OR ASSISTANCE TO COMPLETE EVALUATION: No modification of tasks or assist necessary to complete an evaluation.  OT OCCUPATIONAL PROFILE AND HISTORY: Problem focused assessment: Including review of records relating to presenting problem.  CLINICAL DECISION MAKING: LOW - limited treatment options, no task modification necessary  REHAB POTENTIAL: Good  EVALUATION COMPLEXITY: Low    PLAN:  OT FREQUENCY: 2x/week  OT DURATION: 12 weeks POC writtent for 12 weeks to account for scheduling, may d/c sooner based upon progress.  PLANNED INTERVENTIONS: 97168 OT Re-evaluation, 97535 self care/ADL training, 95621 therapeutic exercise, 97530 therapeutic activity, 97112 neuromuscular re-education, 97140 manual therapy, 97113 aquatic therapy, 97035 ultrasound, 97018 paraffin, 30865 moist heat, 97010 cryotherapy, 97014 electrical stimulation unattended, 97129 Cognitive training (first 15 min), 78469 Cognitive training(each additional 15 min), passive range of motion, balance training, functional mobility training, psychosocial skills training, energy conservation, coping strategies training, patient/family education, and DME and/or AE instructions  RECOMMENDED OTHER SERVICES: PT  CONSULTED AND AGREED WITH PLAN OF CARE: Patient and family member/caregiver  PLAN FOR NEXT SESSION:  simulated home mnagement   Atanacio Melnyk, OT 07/20/2023, 2:11 PM

## 2023-07-21 ENCOUNTER — Ambulatory Visit

## 2023-07-21 DIAGNOSIS — R2681 Unsteadiness on feet: Secondary | ICD-10-CM

## 2023-07-21 DIAGNOSIS — I639 Cerebral infarction, unspecified: Secondary | ICD-10-CM | POA: Diagnosis not present

## 2023-07-21 DIAGNOSIS — R2689 Other abnormalities of gait and mobility: Secondary | ICD-10-CM

## 2023-07-21 DIAGNOSIS — M6281 Muscle weakness (generalized): Secondary | ICD-10-CM

## 2023-07-21 DIAGNOSIS — R278 Other lack of coordination: Secondary | ICD-10-CM

## 2023-07-21 NOTE — Therapy (Signed)
 OUTPATIENT PHYSICAL THERAPY NEURO TREATMENT   Patient Name: Aimee Hood MRN: [id number] DOB:03-03-1941, 83 y.o., female Today's Date: 07/21/2023   PCP: Dennis Bast REFERRING PROVIDER: Reymundo Poll   END OF SESSION:  PT End of Session - 07/21/23 0918     Visit Number 4    Date for PT Re-Evaluation 10/04/23    PT Start Time 0920    PT Stop Time 1005    PT Time Calculation (min) 45 min    Activity Tolerance Patient tolerated treatment well    Behavior During Therapy Flat affect                Past Medical History:  Diagnosis Date   Allergy    Arthritis    "all over" (07/10/2013)   CAD (coronary artery disease)    a. normal cors by cath in 2016  b. 08/2016: admitted with an NSTEMI. Cath showed mild nonobstructive CAD with only 20% mid-LAD stenosis. EF was reduced to 35% and wall motion abnormalities were suggestive of a stress-induced cardiomyopathy.   Carpal tunnel syndrome of left wrist    Cerebral aneurysm, nonruptured    3-4 mm in size, stable since 2004 (most recent MRI 2014)   Colon polyp 2007   Diverticulitis    Family history of anesthesia complication    "brother had PONV"   Gastroesophageal reflux disease    High cholesterol    Hypertension    Insomnia    Macular degeneration 08/31/2016   PONV (postoperative nausea and vomiting)    Restless leg syndrome    Stroke [facility]) 02/2013   denies residual on 07/10/2013)   Past Surgical History:  Procedure Laterality Date   BREAST SURGERY     CHOLECYSTECTOMY     JOINT REPLACEMENT     KNEE ARTHROSCOPY Bilateral    LEFT HEART CATH AND CORONARY ANGIOGRAPHY N/A 09/01/2016   Procedure: Left Heart Cath and Coronary Angiography;  Surgeon: Iran Ouch, MD;  Location: [facility] INVASIVE CV LAB;  Service: Cardiovascular;  Laterality: N/A;   LEFT HEART CATHETERIZATION WITH CORONARY ANGIOGRAM N/A 06/20/2014   Procedure: LEFT HEART CATHETERIZATION WITH CORONARY ANGIOGRAM;  Surgeon: Jake Bathe, MD;  Location: [facility] CATH  LAB;  Service: Cardiovascular;  Laterality: N/A;   TEE WITHOUT CARDIOVERSION N/A 03/14/2013   Procedure: TRANSESOPHAGEAL ECHOCARDIOGRAM (TEE);  Surgeon: Donato Schultz, MD;  Location: [facility] ENDOSCOPY;  Service: Cardiovascular;  Laterality: N/A;   TOTAL KNEE ARTHROPLASTY Left 07/10/2013   TOTAL KNEE ARTHROPLASTY Left 07/10/2013   Procedure: TOTAL KNEE ARTHROPLASTY;  Surgeon: Valeria Batman, MD;  Location: [facility] OR;  Service: Orthopedics;  Laterality: Left;   VAGINAL HYSTERECTOMY  2000's   Patient Active Problem List   Diagnosis Date Noted   CVA (cerebral vascular accident) (HCC) 07/05/2023   Left-sided weakness 07/04/2023   HLD (hyperlipidemia) 07/04/2023   GAD (generalized anxiety disorder) 07/04/2023   Impingement syndrome of right shoulder 10/28/2021   Carpal tunnel syndrome, bilateral 12/29/2018   Pain in right knee 12/05/2018   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   NSTEMI (non-ST elevated myocardial infarction) (HCC)    Chest pain with moderate risk of acute coronary syndrome 08/31/2016   Normal coronary arteries-2016 08/31/2016   Chest pain on exertion 06/11/2014   Osteoarthritis of left knee 07/12/2013   S/P total knee replacement using cement 07/10/2013   Aneurysm, cerebral, nonruptured 04/23/2013   History of LMCA CVA-TPA 2014 03/09/2013   Osteoarthritis 11/14/2012   Restless legs syndrome 11/14/2012   GERD (gastroesophageal reflux disease) 11/13/2012  Essential hypertension 11/05/2012   Other and unspecified hyperlipidemia 11/05/2012    ONSET DATE: 07/04/23  REFERRING DIAG:  I63.9 (ICD-10-CM) - Cerebrovascular accident (CVA), unspecified mechanism (HCC)      THERAPY DIAG:  Other lack of coordination  Muscle weakness (generalized)  Unsteadiness on feet  Other abnormalities of gait and mobility  Cerebrovascular accident (CVA), unspecified mechanism (HCC)  Rationale for Evaluation and Treatment: Rehabilitation  SUBJECTIVE:                                                                                                                                                                                              SUBJECTIVE STATEMENT: Nothing new today, doing good.    Pt accompanied by:  son  PERTINENT HISTORY: 07/04/23--Aimee Hood is a 83 y.o. person living with a history of hypertension, hyperlipidemia, GAD, GERD, obesity, vitamin B12 deficiency, vitamin D deficiency, recurrent epistaxis, remote left MCA distribution cortical stroke in 2014 who presented with new left sided weakness and admitted for Stroke workup on hospital day 0. MRIBrain:Positive for a small 11 mm Acute Infarct in the posterior right corona radiata tracking toward the insula   Hx of LMCA in 2014 L knee TKA 2015   PAIN:  Are you having pain? No 0/10  PRECAUTIONS: Fall  RED FLAGS: None   WEIGHT BEARING RESTRICTIONS: No  FALLS: Has patient fallen in last 6 months? No  LIVING ENVIRONMENT: Lives with: lives with their son Lives in: House/apartment Stairs: Yes: External: 2 steps; has a ramp that she uses  Has following equipment at home: Single point cane, Walker - 4 wheeled, and Wheelchair (manual)  PLOF: Independent  PATIENT GOALS: just be normal, patient's son states that she gets tired after putting on clothes so it would be a good goal to work on her endurance  OBJECTIVE:  Note: Objective measures were completed at Evaluation unless otherwise noted.  DIAGNOSTIC FINDINGS: CT head code stroke without contrast IMPRESSION: 1. No evidence of an acute intracranial abnormality. 2. Parenchymal atrophy, chronic small vessel ischemic disease and chronic infarcts, as described. 3. Mild generalized cerebral atrophy. 4. Paranasal sinus disease as outlined. Correlate for signs/symptoms of acute sinusitis. 5. Nasal septal defect.   CT Angio Head and neck with and without contrast IMPRESSION: CTA neck:   1. The common carotid and internal carotid arteries are patent within the  neck without stenosis or significant atherosclerotic disease. 2. Venous reflux of contrast partially obscures the left vertebral artery V1 segment. Within this limitation, the vertebral arteries are patent within the neck without stenosis or significant atherosclerotic disease. 3. Aortic Atherosclerosis (ICD10-I70.0).   CTA  head:   1. No proximal intracranial large vessel occlusion identified. 2. Intracranial atherosclerotic disease with multifocal stenoses, most notably as follows. 3. Severe stenosis within the right PCA P2 segment. 4. Severe stenosis within the right PCA P3 segment. 5. Moderate stenosis within the left PCA at the P1/P2 junction. 6. 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     MR Brain IMPRESSION: 1. Positive for a small 11 mm Acute Infarct in the posterior right corona radiata tracking toward the insula. No associated hemorrhage or mass effect. 2. Progressed chronic ischemic disease since a 2020 MRI, which is otherwise most pronounced in the posterior left MCA territory.  COGNITION: Overall cognitive status: Within functional limits for tasks assessed   SENSATION: WFL   POSTURE: rounded shoulders, forward head, and increased thoracic kyphosis  LOWER EXTREMITY ROM:   all WNL   LOWER EXTREMITY MMT:  grossly 4/5 BLE    TRANSFERS: Assistive device utilized: Environmental consultant - 4 wheeled  Sit to stand: Modified independence Stand to sit: Modified independence  GAIT: Gait pattern: step to pattern, decreased stride length, ataxic, narrow BOS, poor foot clearance- Right, and poor foot clearance- Left Distance walked: in clinic distances Assistive device utilized: Environmental consultant - 4 wheeled Level of assistance: Complete Independence   FUNCTIONAL TESTS:  5 times sit to stand: 22.38s Timed up and go (TUG): 26.97s with rollator, 28.32s without AD  Berg Balance Scale: 43/56                                                                                                                              TREATMENT DATE:  07/21/23 NuStep L5x22mins  Obstacles course around cones, over half foam roll, and on airex without walker min-modA Side steps on beam  Step ups 6" 1 handrail use  Cone taps on airex needs UE help  STS with chest press holding red ball  Standing on airex rows and ext red band 2x10 On airex reaching for different targets   07/19/23  Nustep L5x8 minutes all four extremities for w/u and neural priming STS with 3# x10 no UEs  Weighted rollator pushes for endurance/strength: 260ft x2 with 22#, then 241ft with 36# Forward step ups 4 inch step x12 B  Gait no device close S x260ft  Multiple rounds of gait without walker between chair and // bars   Tandem stance blue foam pad 2x30 seconds B  Tandem gait in // bars solid surface forward and backwards x4 rounds   07/13/23 NuStep L5x3mins Marching with rollator 2# 2x10 Hip abd with rollator 2# 2x10  LAQ 2# 2x10 HS curls red 2x10 STS 2x10 elevated mat table  Box taps 4"  Step ups 4" Side steps in bars  Side steps over obstacles in bars Horizontal abduction 2x10 for postural strengthening    07/12/23 EVAL    PATIENT EDUCATION: Education details: POC, HEP Person educated: Patient and Child(ren) Education method: Explanation and  Demonstration Education comprehension: verbalized understanding and returned demonstration  HOME EXERCISE PROGRAM: Access Code: [email or web address] URL: [email or web address] Date: 07/12/2023 Prepared by: Cassie Freer  Exercises - Standing Hip Abduction with Counter Support  - 1 x daily - 7 x weekly - 2 sets - 10 reps - Mini Squat with Counter Support  - 1 x daily - 7 x weekly - 2 sets - 10 reps - Standing March with Counter Support  - 1 x daily - 7 x weekly - 2 sets - 10 reps - Standing Tandem Balance with Counter Support  - 1 x daily - 7 x weekly - 3 reps - 30 hold - Standing Single Leg Stance with Counter Support  - 1 x  daily - 7 x weekly - 5 reps - 10 hold  GOALS: Goals reviewed with patient? Yes  SHORT TERM GOALS: Target date: 08/23/23  Patient will be independent with initial HEP. Baseline: 07/12/23- given Goal status: INITIAL  2.  Patient will demonstrate decreased fall risk by scoring < 15 sec on TUG with AD. Baseline: 26.97s Goal status: INITIAL   LONG TERM GOALS: Target date: 10/04/23  Patient will be independent with advanced/ongoing HEP to improve outcomes and carryover.  Baseline:  Goal status: INITIAL  2.  Patient will be able to ambulate 300' without AD and good safety to access community.  Baseline: using rollator at eval Goal status: INITIAL  3.  Patient will demonstrate improved functional LE strength as demonstrated by 5xSTS w/o UE push off <15s. Baseline: 22.38s Goal status: INITIAL  4.  Patient will score 51 on Berg Balance test to demonstrate lower risk of falls. (MCID= 8 points) Baseline: 43 Goal status: INITIAL  5.  Patient will demonstrate tandem stance >30s and SLS > 10s independently  Baseline:  Goal status: INITIAL  6.  Patient will demonstrate decreased fall risk by scoring < 15 sec on TUG without AD. Baseline: 28.32s Goal status: INITIAL  ASSESSMENT:  CLINICAL IMPRESSION: Continued working on a good mix of functional activity tolerance, strength, and balance training today as per POC. She had some LOB with stepping up on airex, needing modA to prevent loss of balance. Cone taps while of foam surface was very hard, required assistance from PT or holds on to bars. Does well with static balance on airex.  Will continue to challenge her with more dynamic balance.     Patient is a 83 y.o. female who was seen today for physical therapy evaluation and treatment for CVA. She is currently ambulating with a rollator but was fully independently prior to the stroke on 07/04/23. With assessments completed today, she does present as a fall risk. She was able to ambulate a few  feet without the rollator very carefully and slowly. She would like to be able to return to do her daily 30 min walks. Patient will benefit from skilled PT to address her balance, strength, and gait abnormalities to improve her confidence, increase her functional independence and allow her to return to PLOF.   OBJECTIVE IMPAIRMENTS: Abnormal gait, decreased activity tolerance, decreased balance, decreased endurance, difficulty walking, decreased strength, improper body mechanics, postural dysfunction, and pain.   ACTIVITY LIMITATIONS: squatting, stairs, transfers, and locomotion level  PARTICIPATION LIMITATIONS: cleaning, laundry, and yard work  [facility] POTENTIAL: Good  CLINICAL DECISION MAKING: Stable/uncomplicated  EVALUATION COMPLEXITY: Low  PLAN:  PT FREQUENCY: 2x/week  PT DURATION: 12 weeks  PLANNED INTERVENTIONS: 97110-Therapeutic exercises, 97530- Therapeutic activity, [id number]- Neuromuscular re-education, 97535- Self Care, 54098- Manual therapy, [id number]-  Gait training, Patient/Family education, Balance training, Stair training, Cryotherapy, and Moist heat  PLAN FOR NEXT SESSION: functional strengthening and balance training, try to do some walking without rollator, challenge endurance   Cassie Freer, PT, DPT 07/21/23 10:05 AM

## 2023-07-22 ENCOUNTER — Ambulatory Visit: Admitting: Occupational Therapy

## 2023-07-26 ENCOUNTER — Ambulatory Visit: Admitting: Physical Therapy

## 2023-07-26 ENCOUNTER — Ambulatory Visit: Admitting: Occupational Therapy

## 2023-07-28 ENCOUNTER — Ambulatory Visit: Admitting: Occupational Therapy

## 2023-07-28 ENCOUNTER — Ambulatory Visit

## 2023-07-28 DIAGNOSIS — R2689 Other abnormalities of gait and mobility: Secondary | ICD-10-CM

## 2023-07-28 DIAGNOSIS — R2681 Unsteadiness on feet: Secondary | ICD-10-CM

## 2023-07-28 DIAGNOSIS — R278 Other lack of coordination: Secondary | ICD-10-CM

## 2023-07-28 DIAGNOSIS — I639 Cerebral infarction, unspecified: Secondary | ICD-10-CM

## 2023-07-28 DIAGNOSIS — M6281 Muscle weakness (generalized): Secondary | ICD-10-CM

## 2023-07-28 DIAGNOSIS — R41840 Attention and concentration deficit: Secondary | ICD-10-CM

## 2023-07-28 NOTE — Therapy (Signed)
 OUTPATIENT OCCUPATIONAL THERAPY NEURO Treatment  Patient Name: Aimee Hood MRN: [id number] DOB:Mar 27, 1941, 83 y.o., female Today's Date: 07/28/2023  PCP: Dr. Elayne Greaser PROVIDER:Guiloud MD END OF SESSION:  OT End of Session - 07/28/23 1329     Visit Number 5    Number of Visits 25    Date for OT Re-Evaluation 09/29/23    Authorization Type HTA    Authorization - Visit Number 5    Progress Note Due on Visit 10    OT Start Time 1232    OT Stop Time 1313    OT Time Calculation (min) 41 min    Activity Tolerance Patient tolerated treatment well    Behavior During Therapy [facility] for tasks assessed/performed               Past Medical History:  Diagnosis Date   Allergy    Arthritis    "all over" (07/10/2013)   CAD (coronary artery disease)    a. normal cors by cath in 2016  b. 08/2016: admitted with an NSTEMI. Cath showed mild nonobstructive CAD with only 20% mid-LAD stenosis. EF was reduced to 35% and wall motion abnormalities were suggestive of a stress-induced cardiomyopathy.   Carpal tunnel syndrome of left wrist    Cerebral aneurysm, nonruptured    3-4 mm in size, stable since 2004 (most recent MRI 2014)   Colon polyp 2007   Diverticulitis    Family history of anesthesia complication    "brother had PONV"   Gastroesophageal reflux disease    High cholesterol    Hypertension    Insomnia    Macular degeneration 08/31/2016   PONV (postoperative nausea and vomiting)    Restless leg syndrome    Stroke [facility]) 02/2013   denies residual on 07/10/2013)   Past Surgical History:  Procedure Laterality Date   BREAST SURGERY     CHOLECYSTECTOMY     JOINT REPLACEMENT     KNEE ARTHROSCOPY Bilateral    LEFT HEART CATH AND CORONARY ANGIOGRAPHY N/A 09/01/2016   Procedure: Left Heart Cath and Coronary Angiography;  Surgeon: Wenona Hamilton, MD;  Location: [facility] INVASIVE CV LAB;  Service: Cardiovascular;  Laterality: N/A;   LEFT HEART CATHETERIZATION WITH CORONARY ANGIOGRAM  N/A 06/20/2014   Procedure: LEFT HEART CATHETERIZATION WITH CORONARY ANGIOGRAM;  Surgeon: Hugh Madura, MD;  Location: [facility] CATH LAB;  Service: Cardiovascular;  Laterality: N/A;   TEE WITHOUT CARDIOVERSION N/A 03/14/2013   Procedure: TRANSESOPHAGEAL ECHOCARDIOGRAM (TEE);  Surgeon: Dorothye Gathers, MD;  Location: [facility] ENDOSCOPY;  Service: Cardiovascular;  Laterality: N/A;   TOTAL KNEE ARTHROPLASTY Left 07/10/2013   TOTAL KNEE ARTHROPLASTY Left 07/10/2013   Procedure: TOTAL KNEE ARTHROPLASTY;  Surgeon: Shirlee Dotter, MD;  Location[facility] OR;  Service: Orthopedics;  Laterality: Left;   VAGINAL HYSTERECTOMY  2000's   Patient Active Problem List   Diagnosis Date Noted   CVA (cerebral vascular accident) (HCC) 07/05/2023   Left-sided weakness 07/04/2023   HLD (hyperlipidemia) 07/04/2023   GAD (generalized anxiety disorder) 07/04/2023   Impingement syndrome of right shoulder 10/28/2021   Carpal tunnel syndrome, bilateral 12/29/2018   Pain in right knee 12/05/2018   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   NSTEMI (non-ST elevated myocardial infarction) (HCC)    Chest pain with moderate risk of acute coronary syndrome 08/31/2016   Normal coronary arteries-2016 08/31/2016   Chest pain on exertion 06/11/2014   Osteoarthritis of left knee 07/12/2013   S/P total knee replacement using cement 07/10/2013   Aneurysm, cerebral, nonruptured 04/23/2013  History of LMCA CVA-TPA 2014 03/09/2013   Osteoarthritis 11/14/2012   Restless legs syndrome 11/14/2012   GERD (gastroesophageal reflux disease) 11/13/2012   Essential hypertension 11/05/2012   Other and unspecified hyperlipidemia 11/05/2012    ONSET DATE: 07/04/23  REFERRING DIAG: I63.9 (ICD-10-CM) - Cerebrovascular accident (CVA), unspecified mechanism (HCC)  THERAPY DIAG:  No diagnosis found.  Rationale for Evaluation and Treatment: Rehabilitation  SUBJECTIVE:   SUBJECTIVE STATEMENT: Pt  reports every thing is going well at home Pt accompanied by:  family member  PERTINENT HISTORY: 83 y.o. right-handed female hospitalized 3/24 with hx of remote left MCA distribution cortical stroke presenting with aphasia s/p tPA in 2014 without residual deficit, essential hypertension, hyperlipidemia, NSTEMI in 2018, diverticulitis, GERD, OA, and RLS hospitalized 3/24 for evaluation of left-sided weakness.  MR Brain:Positive for a small 11 mm Acute Infarct in the posterior right  corona radiata tracking toward the insula  PRECAUTIONS: fall WEIGHT BEARING RESTRICTIONS: No  PAIN:  Are you having pain? No  FALLS: Has patient fallen in last 6 months? No  LIVING ENVIRONMENT: Lives with: lives with their family Lives in: House/apartment Stairs:  2 steps  Has following equipment at home: Single point cane, shower chair, and BSC  PLOF: Independent  PATIENT GOALS: to be more independent  OBJECTIVE:  Note: Objective measures were completed at Evaluation unless otherwise noted.  HAND DOMINANCE: Right  ADLs: Overall ADLs: Supervision for basic ADLS Transfers/ambulation related to ADLs:  Tub Shower transfers: supervision, shower seat Equipment: Shower seat with back  IADLs:  Light housekeeping: family performs Meal Prep: has not attempted , cooked previously [employer] mobility: supervision with rollator Medication management: son is Brewing technologist: son is Database administrator: 100% legible  MOBILITY STATUS:  supervision with rollator      UPPER EXTREMITY ROM:  RUE is grossly WFLS  Active ROM Right eval Left eval  Shoulder flexion  100  Shoulder abduction  80  Shoulder adduction    Shoulder extension    Shoulder internal rotation    Shoulder external rotation    Elbow flexion  WFLs  Elbow extension  -10  Wrist flexion    Wrist extension    Wrist ulnar deviation    Wrist radial deviation    Wrist pronation    Wrist supination    (Blank rows = not tested)  UPPER EXTREMITY MMT:     MMT Right eval  Left eval  Shoulder flexion 4-/5 3+/5  Shoulder abduction    Shoulder adduction    Shoulder extension    Shoulder internal rotation    Shoulder external rotation    Middle trapezius    Lower trapezius    Elbow flexion 4/5 3+/5  Elbow extension 4/5 3+/5  Wrist flexion    Wrist extension    Wrist ulnar deviation    Wrist radial deviation    Wrist pronation    Wrist supination    (Blank rows = not tested)  HAND FUNCTION: Grip strength: Right: 28 lbs; Left: 26 lbs  COORDINATION: 9 hole peg test R 28.32, L 32.80 with 1 drop and decreased control   SENSATION: WFL, tingling at times      COGNITION: Overall cognitive status:  hx of aphasia with word finding from inital CVA, increased time for following commands, cognition to addressed within a functional context   VISION ASSESSMENT:Pt denies changes to vision     OBSERVATIONS: Pleasant female motivated to improve accompanied by her son  TREATMENT DATE: 07/28/23- UBE x 6 mins level 1 for conditioning Weighted cane for shoulder flexion to shoulder height, min v.c shoulder flexion, abduction biceps curls and chest press 10-15 reps each min v.c Gripper set at level 2 resistance to pick up 1 inch blocks with left and right hands, mod drops with LUE, better performance with right, min difficulty drops Dynamic standing balance activity to retrieve graded clothespins 1-8# form lower surface with left then right hand to place on targets in prep for home management, no LOB. Velcro roller x 4 reps each UE for strength in prep for home managment Reveiwed red putty exercises for sustained grip and pinch, min v.c bilateral UE's 07/20/23-UBE x 6 mins level 1 for conditioning Reviewed yellow theraband HEP 2 sets of 10 reps each, min v.c Standing for dynamic reaching using LUE to place graded clothepins (1-8#) for  sustained pinch and functional reach, with supervision, no LOB Standing to copy small peg design on vertical surface with LUE for fine motor coordiantion and standing tolerance, min drops, no LOB.  07/18/23-Placing metal pegs 3 different sizes with in hand manipulation then removing for increased fine motor coordiantion, min difficulty, v.c Pt was instructed in beginning yellow theraband HEP, and red putty HEP, see pt instructions.  07/13/23- fine motor coordination activities: flipping and dealing cards with LUE , stacking pennies then manipulating to place in a container, and rotating ball, min difficulty and v.c Cane exercise HEP issued, 10-15 reps min v.c-See pt education Pt performed yellow theraband exercises for low range shoulder horizontal abduction, biceps curls and triceps extension, 10-15 reps each min v.c and demonstration. Therapist did not issue for home. Pt can benefit from review. copying small peg design for LUE fine motor coordination, min difficulty, v.c to avoid compensation.  07/07/23- eval         PATIENT EDUCATION: Education details:red putty HEP, yellow theraband HEP, min v.c and demonstration, 10-15 reps each  Person educated: Patient Education method: Explanation, demonstration, handout, min v.c Education comprehension: verbalized understanding, returned demonstration  HOME EXERCISE PROGRAM: coordination, cane, beginning yellow theraband   GOALS: Goals reviewed with patient? Yes  SHORT TERM GOALS: Target date: 08/07/23  I with HEP  Goal status: ongoing  2.  Pt will increase bilateral grip strength by 4 lbs for increased functional use.  Goal status: ongoing  3.  Pt will perfrom basic home management with supervision without LOB  Goal status: ongoing  4.  Pt will perfrom basic cooking with min A   Goal status:  met, for microwave     LONG TERM GOALS: Target date: 09/29/23  I with updated HEP  Goal status: INITIAL  2.  Pt will resume home  management activities mod I    Goal status: INITIAL  3.  Pt will resume stove top cooking mod I  Goal status: INITIAL  4.  Pt will retrieve a 3 lbs object from eye level shelf with LUE demonstrating good control.  Goal status: ongoing    ASSESSMENT:  CLINICAL IMPRESSION: Pt is progressing toward goals. She demonstrates improving balance, strength and endurance.  PERFORMANCE DEFICITS: in functional skills including ADLs, IADLs, coordination, dexterity, ROM, strength, flexibility, Fine motor control, Gross motor control, mobility, balance, decreased knowledge of precautions, decreased knowledge of use of DME, and UE functional use, cognitive skills including attention, safety awareness, thought, and understand, and psychosocial skills including coping strategies, environmental adaptation, habits, interpersonal interactions, and routines and behaviors.   IMPAIRMENTS: are limiting patient from ADLs, IADLs,  play, leisure, and social participation.   CO-MORBIDITIES: may have co-morbidities  that affects occupational performance. Patient will benefit from skilled OT to address above impairments and improve overall function.  MODIFICATION OR ASSISTANCE TO COMPLETE EVALUATION: No modification of tasks or assist necessary to complete an evaluation.  OT OCCUPATIONAL PROFILE AND HISTORY: Problem focused assessment: Including review of records relating to presenting problem.  CLINICAL DECISION MAKING: LOW - limited treatment options, no task modification necessary  REHAB POTENTIAL: Good  EVALUATION COMPLEXITY: Low    PLAN:  OT FREQUENCY: 2x/week  OT DURATION: 12 weeks POC writtent for 12 weeks to account for scheduling, may d/c sooner based upon progress.  PLANNED INTERVENTIONS: 97168 OT Re-evaluation, 97535 self care/ADL training, 78295 therapeutic exercise, 97530 therapeutic activity, 97112 neuromuscular re-education, 97140 manual therapy, 97113 aquatic therapy, 97035 ultrasound,  97018 paraffin, 62130 moist heat, 97010 cryotherapy, 97014 electrical stimulation unattended, 97129 Cognitive training (first 15 min), 86578 Cognitive training(each additional 15 min), passive range of motion, balance training, functional mobility training, psychosocial skills training, energy conservation, coping strategies training, patient/family education, and DME and/or AE instructions  RECOMMENDED OTHER SERVICES: PT  CONSULTED AND AGREED WITH PLAN OF CARE: Patient and family member/caregiver  PLAN FOR NEXT SESSION: continue simulated home managment, strength   Jadie Allington, OT 07/28/2023, 1:29 PM

## 2023-07-28 NOTE — Therapy (Signed)
 OUTPATIENT PHYSICAL THERAPY NEURO TREATMENT   Patient Name: Aimee Hood MRN: [id number] DOB:Oct 01, 1940, 83 y.o., female Today's Date: 07/28/2023   PCP: Myrtha Ates REFERRING PROVIDER: Lasandra Points   END OF SESSION:  PT End of Session - 07/28/23 1135     Visit Number 5    Date for PT Re-Evaluation 10/04/23    PT Start Time 1142    PT Stop Time 1225    PT Time Calculation (min) 43 min    Activity Tolerance Patient tolerated treatment well    Behavior During Therapy Flat affect                 Past Medical History:  Diagnosis Date   Allergy    Arthritis    "all over" (07/10/2013)   CAD (coronary artery disease)    a. normal cors by cath in 2016  b. 08/2016: admitted with an NSTEMI. Cath showed mild nonobstructive CAD with only 20% mid-LAD stenosis. EF was reduced to 35% and wall motion abnormalities were suggestive of a stress-induced cardiomyopathy.   Carpal tunnel syndrome of left wrist    Cerebral aneurysm, nonruptured    3-4 mm in size, stable since 2004 (most recent MRI 2014)   Colon polyp 2007   Diverticulitis    Family history of anesthesia complication    "brother had PONV"   Gastroesophageal reflux disease    High cholesterol    Hypertension    Insomnia    Macular degeneration 08/31/2016   PONV (postoperative nausea and vomiting)    Restless leg syndrome    Stroke [facility]) 02/2013   denies residual on 07/10/2013)   Past Surgical History:  Procedure Laterality Date   BREAST SURGERY     CHOLECYSTECTOMY     JOINT REPLACEMENT     KNEE ARTHROSCOPY Bilateral    LEFT HEART CATH AND CORONARY ANGIOGRAPHY N/A 09/01/2016   Procedure: Left Heart Cath and Coronary Angiography;  Surgeon: Wenona Hamilton, MD;  Location: [facility] INVASIVE CV LAB;  Service: Cardiovascular;  Laterality: N/A;   LEFT HEART CATHETERIZATION WITH CORONARY ANGIOGRAM N/A 06/20/2014   Procedure: LEFT HEART CATHETERIZATION WITH CORONARY ANGIOGRAM;  Surgeon: Hugh Madura, MD;  Location: [facility] CATH  LAB;  Service: Cardiovascular;  Laterality: N/A;   TEE WITHOUT CARDIOVERSION N/A 03/14/2013   Procedure: TRANSESOPHAGEAL ECHOCARDIOGRAM (TEE);  Surgeon: Dorothye Gathers, MD;  Location: [facility] ENDOSCOPY;  Service: Cardiovascular;  Laterality: N/A;   TOTAL KNEE ARTHROPLASTY Left 07/10/2013   TOTAL KNEE ARTHROPLASTY Left 07/10/2013   Procedure: TOTAL KNEE ARTHROPLASTY;  Surgeon: Shirlee Dotter, MD;  Location: [facility] OR;  Service: Orthopedics;  Laterality: Left;   VAGINAL HYSTERECTOMY  2000's   Patient Active Problem List   Diagnosis Date Noted   CVA (cerebral vascular accident) (HCC) 07/05/2023   Left-sided weakness 07/04/2023   HLD (hyperlipidemia) 07/04/2023   GAD (generalized anxiety disorder) 07/04/2023   Impingement syndrome of right shoulder 10/28/2021   Carpal tunnel syndrome, bilateral 12/29/2018   Pain in right knee 12/05/2018   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   NSTEMI (non-ST elevated myocardial infarction) (HCC)    Chest pain with moderate risk of acute coronary syndrome 08/31/2016   Normal coronary arteries-2016 08/31/2016   Chest pain on exertion 06/11/2014   Osteoarthritis of left knee 07/12/2013   S/P total knee replacement using cement 07/10/2013   Aneurysm, cerebral, nonruptured 04/23/2013   History of LMCA CVA-TPA 2014 03/09/2013   Osteoarthritis 11/14/2012   Restless legs syndrome 11/14/2012   GERD (gastroesophageal reflux disease) 11/13/2012  Essential hypertension 11/05/2012   Other and unspecified hyperlipidemia 11/05/2012    ONSET DATE: 07/04/23  REFERRING DIAG:  I63.9 (ICD-10-CM) - Cerebrovascular accident (CVA), unspecified mechanism (HCC)      THERAPY DIAG:  Other lack of coordination  Muscle weakness (generalized)  Unsteadiness on feet  Other abnormalities of gait and mobility  Cerebrovascular accident (CVA), unspecified mechanism (HCC)  Attention and concentration deficit  Rationale for Evaluation and Treatment: Rehabilitation  SUBJECTIVE:                                                                                                                                                                                              SUBJECTIVE STATEMENT: Nothing new today, doing good.    Pt accompanied by:  son  PERTINENT HISTORY: 07/04/23--Aimee Hood is a 83 y.o. person living with a history of hypertension, hyperlipidemia, GAD, GERD, obesity, vitamin B12 deficiency, vitamin D deficiency, recurrent epistaxis, remote left MCA distribution cortical stroke in 2014 who presented with new left sided weakness and admitted for Stroke workup on hospital day 0. MRIBrain:Positive for a small 11 mm Acute Infarct in the posterior right corona radiata tracking toward the insula   Hx of LMCA in 2014 L knee TKA 2015   PAIN:  Are you having pain? No 0/10  PRECAUTIONS: Fall  RED FLAGS: None   WEIGHT BEARING RESTRICTIONS: No  FALLS: Has patient fallen in last 6 months? No  LIVING ENVIRONMENT: Lives with: lives with their son Lives in: House/apartment Stairs: Yes: External: 2 steps; has a ramp that she uses  Has following equipment at home: Single point cane, Walker - 4 wheeled, and Wheelchair (manual)  PLOF: Independent  PATIENT GOALS: just be normal, patient's son states that she gets tired after putting on clothes so it would be a good goal to work on her endurance  OBJECTIVE:  Note: Objective measures were completed at Evaluation unless otherwise noted.  DIAGNOSTIC FINDINGS: CT head code stroke without contrast IMPRESSION: 1. No evidence of an acute intracranial abnormality. 2. Parenchymal atrophy, chronic small vessel ischemic disease and chronic infarcts, as described. 3. Mild generalized cerebral atrophy. 4. Paranasal sinus disease as outlined. Correlate for signs/symptoms of acute sinusitis. 5. Nasal septal defect.   CT Angio Head and neck with and without contrast IMPRESSION: CTA neck:   1. The common carotid and internal  carotid arteries are patent within the neck without stenosis or significant atherosclerotic disease. 2. Venous reflux of contrast partially obscures the left vertebral artery V1 segment. Within this limitation, the vertebral arteries are patent within the neck without stenosis or significant atherosclerotic disease. 3. Aortic  Atherosclerosis (ICD10-I70.0).   CTA head:   1. No proximal intracranial large vessel occlusion identified. 2. Intracranial atherosclerotic disease with multifocal stenoses, most notably as follows. 3. Severe stenosis within the right PCA P2 segment. 4. Severe stenosis within the right PCA P3 segment. 5. Moderate stenosis within the left PCA at the P1/P2 junction. 6. 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     MR Brain IMPRESSION: 1. Positive for a small 11 mm Acute Infarct in the posterior right corona radiata tracking toward the insula. No associated hemorrhage or mass effect. 2. Progressed chronic ischemic disease since a 2020 MRI, which is otherwise most pronounced in the posterior left MCA territory.  COGNITION: Overall cognitive status: Within functional limits for tasks assessed   SENSATION: WFL   POSTURE: rounded shoulders, forward head, and increased thoracic kyphosis  LOWER EXTREMITY ROM:   all WNL   LOWER EXTREMITY MMT:  grossly 4/5 BLE    TRANSFERS: Assistive device utilized: Environmental consultant - 4 wheeled  Sit to stand: Modified independence Stand to sit: Modified independence  GAIT: Gait pattern: step to pattern, decreased stride length, ataxic, narrow BOS, poor foot clearance- Right, and poor foot clearance- Left Distance walked: in clinic distances Assistive device utilized: Environmental consultant - 4 wheeled Level of assistance: Complete Independence   FUNCTIONAL TESTS:  5 times sit to stand: 22.38s Timed up and go (TUG): 26.97s with rollator, 28.32s without AD  Berg Balance Scale: 43/56                                                                                                                              TREATMENT DATE:  07/28/23 NuStep L5x89mins  Walking on beam needs bars Marching on airex min-modA  Walking without device 2 laps with 1 rest break  STS on airex 2x10 Calf raises 2x12 Side steps  Red TB horizontal abd for posture 2x10   07/21/23 NuStep L5x51mins  Obstacles course around cones, over half foam roll, and on airex without walker min-modA Side steps on beam  Step ups 6" 1 handrail use  Cone taps on airex needs UE help  STS with chest press holding red ball  Standing on airex rows and ext red band 2x10 On airex reaching for different targets   07/19/23  Nustep L5x8 minutes all four extremities for w/u and neural priming STS with 3# x10 no UEs  Weighted rollator pushes for endurance/strength: 290ft x2 with 22#, then 212ft with 36# Forward step ups 4 inch step x12 B  Gait no device close S x219ft  Multiple rounds of gait without walker between chair and // bars   Tandem stance blue foam pad 2x30 seconds B  Tandem gait in // bars solid surface forward and backwards x4 rounds   07/13/23 NuStep L5x5mins Marching with rollator 2# 2x10 Hip abd with rollator 2# 2x10  LAQ 2# 2x10 HS curls red 2x10 STS 2x10 elevated mat table  Box taps 4"  Step ups 4" Side steps in bars  Side steps over obstacles in bars Horizontal abduction 2x10 for postural strengthening    07/12/23 EVAL    PATIENT EDUCATION: Education details: POC, HEP Person educated: Patient and Child(ren) Education method: Medical illustrator Education comprehension: verbalized understanding and returned demonstration  HOME EXERCISE PROGRAM: Access Code: [email or web address] URL: [email or web address] Date: 07/12/2023 Prepared by: Cassie Freer  Exercises - Standing Hip Abduction with Counter Support  - 1 x daily - 7 x weekly - 2 sets - 10 reps - Mini Squat with Counter Support  - 1 x daily -  7 x weekly - 2 sets - 10 reps - Standing March with Counter Support  - 1 x daily - 7 x weekly - 2 sets - 10 reps - Standing Tandem Balance with Counter Support  - 1 x daily - 7 x weekly - 3 reps - 30 hold - Standing Single Leg Stance with Counter Support  - 1 x daily - 7 x weekly - 5 reps - 10 hold  GOALS: Goals reviewed with patient? Yes  SHORT TERM GOALS: Target date: 08/23/23  Patient will be independent with initial HEP. Baseline: 07/12/23- given Goal status: MET 07/28/23  2.  Patient will demonstrate decreased fall risk by scoring < 15 sec on TUG with AD. Baseline: 26.97s Goal status: IN PROGRESS 19s 07/28/23   LONG TERM GOALS: Target date: 10/04/23  Patient will be independent with advanced/ongoing HEP to improve outcomes and carryover.  Baseline:  Goal status: INITIAL  2.  Patient will be able to ambulate 300' without AD and good safety to access community.  Baseline: using rollator at eval Goal status: INITIAL  3.  Patient will demonstrate improved functional LE strength as demonstrated by 5xSTS w/o UE push off <15s. Baseline: 22.38s Goal status: INITIAL  4.  Patient will score 51 on Berg Balance test to demonstrate lower risk of falls. (MCID= 8 points) Baseline: 43 Goal status: INITIAL  5.  Patient will demonstrate tandem stance >30s and SLS > 10s independently  Baseline:  Goal status: INITIAL  6.  Patient will demonstrate decreased fall risk by scoring < 15 sec on TUG without AD. Baseline: 28.32s Goal status: INITIAL  ASSESSMENT:  CLINICAL IMPRESSION: Continued working on a good mix of functional activity tolerance, strength, and balance training today as per POC. She loses her balance backwards with marching on airex, requires assistance to prevent falling. We did some walking without the walker, she is a bit unsteady to start and a little bit wobbly with turns. Still advised to use the walker outside the home for safety. A few LOB with STS on airex.   Will  continue to challenge her with more dynamic balance.     Patient is a 83 y.o. female who was seen today for physical therapy evaluation and treatment for CVA. She is currently ambulating with a rollator but was fully independently prior to the stroke on 07/04/23. With assessments completed today, she does present as a fall risk. She was able to ambulate a few feet without the rollator very carefully and slowly. She would like to be able to return to do her daily 30 min walks. Patient will benefit from skilled PT to address her balance, strength, and gait abnormalities to improve her confidence, increase her functional independence and allow her to return to PLOF.   OBJECTIVE IMPAIRMENTS: Abnormal gait, decreased activity tolerance, decreased balance, decreased endurance, difficulty walking, decreased strength, improper body mechanics,  postural dysfunction, and pain.   ACTIVITY LIMITATIONS: squatting, stairs, transfers, and locomotion level  PARTICIPATION LIMITATIONS: cleaning, laundry, and yard work  [facility] POTENTIAL: Good  CLINICAL DECISION MAKING: Stable/uncomplicated  EVALUATION COMPLEXITY: Low  PLAN:  PT FREQUENCY: 2x/week  PT DURATION: 12 weeks  PLANNED INTERVENTIONS: 97110-Therapeutic exercises, 97530- Therapeutic activity, [id number]- Neuromuscular re-education, 97535- Self Care, [id number]- Manual therapy, [phone]- Gait training, Patient/Family education, Balance training, Stair training, Cryotherapy, and Moist heat  PLAN FOR NEXT SESSION: functional strengthening and balance training, try to do some walking without rollator, challenge endurance   Donavon Fudge, PT, DPT 07/28/23 12:31 PM

## 2023-08-01 ENCOUNTER — Ambulatory Visit

## 2023-08-01 ENCOUNTER — Ambulatory Visit: Admitting: Occupational Therapy

## 2023-08-01 ENCOUNTER — Other Ambulatory Visit: Payer: Self-pay

## 2023-08-01 DIAGNOSIS — R2681 Unsteadiness on feet: Secondary | ICD-10-CM

## 2023-08-01 DIAGNOSIS — R41840 Attention and concentration deficit: Secondary | ICD-10-CM

## 2023-08-01 DIAGNOSIS — R278 Other lack of coordination: Secondary | ICD-10-CM

## 2023-08-01 DIAGNOSIS — I639 Cerebral infarction, unspecified: Secondary | ICD-10-CM | POA: Diagnosis not present

## 2023-08-01 DIAGNOSIS — M6281 Muscle weakness (generalized): Secondary | ICD-10-CM

## 2023-08-01 DIAGNOSIS — R2689 Other abnormalities of gait and mobility: Secondary | ICD-10-CM

## 2023-08-01 NOTE — Therapy (Signed)
 OUTPATIENT PHYSICAL THERAPY NEURO TREATMENT   Patient Name: Aimee Hood MRN: [id number] DOB:[birth year], 83 y.o., female Today's Date: 08/01/2023   PCP: Myrtha Ates REFERRING PROVIDER: Lasandra Points   END OF SESSION:  PT End of Session - 08/01/23 1236     Visit Number 6    Date for PT Re-Evaluation 10/04/23    Progress Note Due on Visit 10    PT Start Time 0930 (P)     PT Stop Time 1010 (P)     PT Time Calculation (min) 40 min (P)     Activity Tolerance Patient tolerated treatment well (P)     Behavior During Therapy WFL for tasks assessed/performed (P)                   Past Medical History:  Diagnosis Date   Allergy    Arthritis    "all over" (07/10/2013)   CAD (coronary artery disease)    a. normal cors by cath in 2016  b. 08/2016: admitted with an NSTEMI. Cath showed mild nonobstructive CAD with only 20% mid-LAD stenosis. EF was reduced to 35% and wall motion abnormalities were suggestive of a stress-induced cardiomyopathy.   Carpal tunnel syndrome of left wrist    Cerebral aneurysm, nonruptured    3-4 mm in size, stable since 2004 (most recent MRI 2014)   Colon polyp 2007   Diverticulitis    Family history of anesthesia complication    "brother had PONV"   Gastroesophageal reflux disease    High cholesterol    Hypertension    Insomnia    Macular degeneration 08/31/2016   PONV (postoperative nausea and vomiting)    Restless leg syndrome    Stroke [facility]) 02/2013   denies residual on 07/10/2013)   Past Surgical History:  Procedure Laterality Date   BREAST SURGERY     CHOLECYSTECTOMY     JOINT REPLACEMENT     KNEE ARTHROSCOPY Bilateral    LEFT HEART CATH AND CORONARY ANGIOGRAPHY N/A 09/01/2016   Procedure: Left Heart Cath and Coronary Angiography;  Surgeon: Wenona Hamilton, MD;  Location: [facility] INVASIVE CV LAB;  Service: Cardiovascular;  Laterality: N/A;   LEFT HEART CATHETERIZATION WITH CORONARY ANGIOGRAM N/A 06/20/2014   Procedure: LEFT HEART  CATHETERIZATION WITH CORONARY ANGIOGRAM;  Surgeon: Hugh Madura, MD;  Location: [facility] CATH LAB;  Service: Cardiovascular;  Laterality: N/A;   TEE WITHOUT CARDIOVERSION N/A 03/14/2013   Procedure: TRANSESOPHAGEAL ECHOCARDIOGRAM (TEE);  Surgeon: Dorothye Gathers, MD;  Location: [facility] ENDOSCOPY;  Service: Cardiovascular;  Laterality: N/A;   TOTAL KNEE ARTHROPLASTY Left 07/10/2013   TOTAL KNEE ARTHROPLASTY Left 07/10/2013   Procedure: TOTAL KNEE ARTHROPLASTY;  Surgeon: Shirlee Dotter, MD;  Location: [facility] OR;  Service: Orthopedics;  Laterality: Left;   VAGINAL HYSTERECTOMY  2000's   Patient Active Problem List   Diagnosis Date Noted   CVA (cerebral vascular accident) (HCC) 07/05/2023   Left-sided weakness 07/04/2023   HLD (hyperlipidemia) 07/04/2023   GAD (generalized anxiety disorder) 07/04/2023   Impingement syndrome of right shoulder 10/28/2021   Carpal tunnel syndrome, bilateral 12/29/2018   Pain in right knee 12/05/2018   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   NSTEMI (non-ST elevated myocardial infarction) (HCC)    Chest pain with moderate risk of acute coronary syndrome 08/31/2016   Normal coronary arteries-2016 08/31/2016   Chest pain on exertion 06/11/2014   Osteoarthritis of left knee 07/12/2013   S/P total knee replacement using cement 07/10/2013   Aneurysm, cerebral, nonruptured 04/23/2013   History  of LMCA CVA-TPA 2014 03/09/2013   Osteoarthritis 11/14/2012   Restless legs syndrome 11/14/2012   GERD (gastroesophageal reflux disease) 11/13/2012   Essential hypertension 11/05/2012   Other and unspecified hyperlipidemia 11/05/2012    ONSET DATE: 07/04/23  REFERRING DIAG:  I63.9 (ICD-10-CM) - Cerebrovascular accident (CVA), unspecified mechanism (HCC)      THERAPY DIAG:  Other lack of coordination  Muscle weakness (generalized)  Unsteadiness on feet  Rationale for Evaluation and Treatment: Rehabilitation  SUBJECTIVE:                                                                                                                                                                                              SUBJECTIVE STATEMENT: I'm using the rollator when I go out but not in house.  I want to drive I think I can do it safely   Pt accompanied by:  son  PERTINENT HISTORY: 07/04/23--Aimee Hood is a 83 y.o. person living with a history of hypertension, hyperlipidemia, GAD, GERD, obesity, vitamin B12 deficiency, vitamin D  deficiency, recurrent epistaxis, remote left MCA distribution cortical stroke in 2014 who presented with new left sided weakness and admitted for Stroke workup on hospital day 0. MRIBrain:Positive for a small 11 mm Acute Infarct in the posterior right corona radiata tracking toward the insula   Hx of LMCA in 2014 L knee TKA 2015   PAIN:  Are you having pain? No 0/10  PRECAUTIONS: Fall  RED FLAGS: None   WEIGHT BEARING RESTRICTIONS: No  FALLS: Has patient fallen in last 6 months? No  LIVING ENVIRONMENT: Lives with: lives with their son Lives in: House/apartment Stairs: Yes: External: 2 steps; has a ramp that she uses  Has following equipment at home: Single point cane, Walker - 4 wheeled, and Wheelchair (manual)  PLOF: Independent  PATIENT GOALS: just be normal, patient's son states that she gets tired after putting on clothes so it would be a good goal to work on her endurance  OBJECTIVE:  Note: Objective measures were completed at Evaluation unless otherwise noted.  DIAGNOSTIC FINDINGS: CT head code stroke without contrast IMPRESSION: 1. No evidence of an acute intracranial abnormality. 2. Parenchymal atrophy, chronic small vessel ischemic disease and chronic infarcts, as described. 3. Mild generalized cerebral atrophy. 4. Paranasal sinus disease as outlined. Correlate for signs/symptoms of acute sinusitis. 5. Nasal septal defect.   CT Angio Head and neck with and without contrast IMPRESSION: CTA neck:   1. The common carotid and  internal carotid arteries are patent within the neck without stenosis or significant atherosclerotic disease. 2. Venous reflux of contrast partially obscures the left  vertebral artery V1 segment. Within this limitation, the vertebral arteries are patent within the neck without stenosis or significant atherosclerotic disease. 3. Aortic Atherosclerosis (ICD10-I70.0).   CTA head:   1. No proximal intracranial large vessel occlusion identified. 2. Intracranial atherosclerotic disease with multifocal stenoses, most notably as follows. 3. Severe stenosis within the right PCA P2 segment. 4. Severe stenosis within the right PCA P3 segment. 5. Moderate stenosis within the left PCA at the P1/P2 junction. 6. 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     MR Brain IMPRESSION: 1. Positive for a small 11 mm Acute Infarct in the posterior right corona radiata tracking toward the insula. No associated hemorrhage or mass effect. 2. Progressed chronic ischemic disease since a 2020 MRI, which is otherwise most pronounced in the posterior left MCA territory.  COGNITION: Overall cognitive status: Within functional limits for tasks assessed   SENSATION: WFL   POSTURE: rounded shoulders, forward head, and increased thoracic kyphosis  LOWER EXTREMITY ROM:   all WNL   LOWER EXTREMITY MMT:  grossly 4/5 BLE    TRANSFERS: Assistive device utilized: Environmental consultant - 4 wheeled  Sit to stand: Modified independence Stand to sit: Modified independence  GAIT: Gait pattern: step to pattern, decreased stride length, ataxic, narrow BOS, poor foot clearance- Right, and poor foot clearance- Left Distance walked: in clinic distances Assistive device utilized: Environmental consultant - 4 wheeled Level of assistance: Complete Independence   FUNCTIONAL TESTS:  5 times sit to stand: 22.38s Timed up and go (TUG): 26.97s with rollator, 28.32s without AD  Berg Balance Scale: 43/56                                                                                                                              TREATMENT DATE:  08/01/23:  Neuromuscular re education:  Gait 500' without device, 2 x weaving to L noted, with answering questions, head turn TUG, 13.58 sec  5 x sit to stand, no UE support  Balance activities to address stability with L LE: Standing R foot on airex, L foot on floor, static and with head turns 15 sec each Tandem standing with R UE support, able to manage without UE support 15 sec Side stepping along length of mat 6 x Walking 20' intervals with horizontal head turns 4 x Standing with L foot on 4" step with horizontal head turns 30 sec       07/28/23 NuStep L5x41mins  Walking on beam needs bars Marching on airex min-modA  Walking without device 2 laps with 1 rest break  STS on airex 2x10 Calf raises 2x12 Side steps  Red TB horizontal abd for posture 2x10   07/21/23 NuStep L5x65mins  Obstacles course around cones, over half foam roll, and on airex without walker min-modA Side steps on beam  Step ups 6" 1 handrail use  Cone taps on airex needs UE help  STS with chest press holding  red ball  Standing on airex rows and ext red band 2x10 On airex reaching for different targets   07/19/23  Nustep L5x8 minutes all four extremities for w/u and neural priming STS with 3# x10 no UEs  Weighted rollator pushes for endurance/strength: 214ft x2 with 22#, then 242ft with 36# Forward step ups 4 inch step x12 B  Gait no device close S x213ft  Multiple rounds of gait without walker between chair and // bars   Tandem stance blue foam pad 2x30 seconds B  Tandem gait in // bars solid surface forward and backwards x4 rounds   07/13/23 NuStep L5x8mins Marching with rollator 2# 2x10 Hip abd with rollator 2# 2x10  LAQ 2# 2x10 HS curls red 2x10 STS 2x10 elevated mat table  Box taps 4"  Step ups 4" Side steps in bars  Side steps over obstacles in bars Horizontal  abduction 2x10 for postural strengthening    07/12/23 EVAL    PATIENT EDUCATION: Education details: POC, HEP Person educated: Patient and Child(ren) Education method: Medical illustrator Education comprehension: verbalized understanding and returned demonstration  HOME EXERCISE PROGRAM: Access Code: [email or web address] URL: [email or web address] Date: 07/12/2023 Prepared by: Donavon Fudge  Exercises - Standing Hip Abduction with Counter Support  - 1 x daily - 7 x weekly - 2 sets - 10 reps - Mini Squat with Counter Support  - 1 x daily - 7 x weekly - 2 sets - 10 reps - Standing March with Counter Support  - 1 x daily - 7 x weekly - 2 sets - 10 reps - Standing Tandem Balance with Counter Support  - 1 x daily - 7 x weekly - 3 reps - 30 hold - Standing Single Leg Stance with Counter Support  - 1 x daily - 7 x weekly - 5 reps - 10 hold  GOALS: Goals reviewed with patient? Yes  SHORT TERM GOALS: Target date: 08/23/23  Patient will be independent with initial HEP. Baseline: 07/12/23- given Goal status: MET 07/28/23  2.  Patient will demonstrate decreased fall risk by scoring < 15 sec on TUG with AD. Baseline: 26.97s Goal status: IN PROGRESS 19s 07/28/23   LONG TERM GOALS: Target date: 10/04/23  Patient will be independent with advanced/ongoing HEP to improve outcomes and carryover.  Baseline:  Goal status: INITIAL  2.  Patient will be able to ambulate 300' without AD and good safety to access community.  Baseline: using rollator at eval Goal status: INITIAL  3.  Patient will demonstrate improved functional LE strength as demonstrated by 5xSTS w/o UE push off <15s. Baseline: 22.38s Goal status:08/01/23:  MET so far  12.74 sec   4.  Patient will score 51 on Berg Balance test to demonstrate lower risk of falls. (MCID= 8 points) Baseline: 43 Goal status: INITIAL  5.  Patient will demonstrate tandem stance >30s and SLS > 10s independently  Baseline:  Goal status:  INITIAL  6.  Patient will demonstrate decreased fall risk by scoring < 15 sec on TUG without AD. Baseline: 28.32s Goal status: 08/01/23: MET so far 13.58 sec  ASSESSMENT:  CLINICAL IMPRESSION: 08/01/23:  the patient has been discharged from occupational therapy.  She is progressing well with PT as well, evidenced by improved TUG and 5 x sit to stand scores.  She has some instability L LE with balance challenges, turns, without UE support.  Did fatigue by the end of today's session.  She will see her neurologist tomorrow will discuss return to driving with  him.    Patient is a 83 y.o. female who was seen today for physical therapy evaluation and treatment for CVA. She is currently ambulating with a rollator but was fully independently prior to the stroke on 07/04/23. With assessments completed today, she does present as a fall risk. She was able to ambulate a few feet without the rollator very carefully and slowly. She would like to be able to return to do her daily 30 min walks. Patient will benefit from skilled PT to address her balance, strength, and gait abnormalities to improve her confidence, increase her functional independence and allow her to return to PLOF.   OBJECTIVE IMPAIRMENTS: Abnormal gait, decreased activity tolerance, decreased balance, decreased endurance, difficulty walking, decreased strength, improper body mechanics, postural dysfunction, and pain.   ACTIVITY LIMITATIONS: squatting, stairs, transfers, and locomotion level  PARTICIPATION LIMITATIONS: cleaning, laundry, and yard work  [facility] POTENTIAL: Good  CLINICAL DECISION MAKING: Stable/uncomplicated  EVALUATION COMPLEXITY: Low  PLAN:  PT FREQUENCY: 2x/week  PT DURATION: 12 weeks  PLANNED INTERVENTIONS: 97110-Therapeutic exercises, 97530- Therapeutic activity, [id number]- Neuromuscular re-education, 97535- Self Care, [id number]- Manual therapy, [phone]- Gait training, Patient/Family education, Balance training, Stair training,  Cryotherapy, and Moist heat  PLAN FOR NEXT SESSION: functional strengthening and balance training, try to do some walking without rollator, challenge endurance   Prophet Renwick PT, DPT, OCS 08/01/23 12:47 PM

## 2023-08-01 NOTE — Patient Instructions (Addendum)
 Local Driver Evaluation Programs:  Comprehensive Evaluation: includes clinical and in vehicle behind the wheel testing by OCCUPATIONAL THERAPIST. Programs have varying levels of adaptive controls available for trial.   [employer], [place] [address] [place], [place]  [address] [phone] or [phone] [email or web address] Evaluator:  Cyndee Crompton, OT/CDRS/CDI/SCDCM/Low Vision Certification  [facility] [address] [place], [place] [address] [phone] [email or web address] Evaluators:  Roxine Cordia, OT and Claudell Cruz, OT  [facility]) [facility] (ONLY SERVES VETERANS!!) [facility] [address] [place], [place]  [address] [phone] [email or web address] Evaluators:  Kai Orange, KT; Cornelio Dike, KT;  Marie Shone, KT (KT=kiniesotherapist)   Clinical evaluations only:  Includes clinical testing, refers to other programs or local certified driving instructor for behind the wheel testing.  [facility] (outpatient Rehab) [facility] [address] [place], [place] [address] [phone] for scheduling [email or web address] Evaluators:  Clarnce Crow, OT; Davonna Estes, OT  Other area clinical evaluators available upon request including [facility], [facility] and [facility]       Resource List What is a Industrial/product designer: Your Road Ahead - A Guide to [employer] Evaluations http://www.thehartford.com/resources/mature-market-excellence/publications-on-aging  Association for Academic librarian - Disability and Driving Fact Sheets http://www.aded.net/?page=510  Driving after a Brain  Injury: Brain Injury Association of America VCShow.co.za?A=SearchResult&SearchID=9495675&ObjectID=2758842&ObjectType=35  Driving with Adaptive Equipment: [employer] Association https://aguirre-arnold.org/                                                    Flexion Lift With Soup Can    With a can in each hand, raise arms out to front and over head in a pain free range. Hold __3__ seconds. Repeat ___10_ times. Do _1__ sessions per day.  Copyright  VHI. All rights reserved.     SHOULDER: Abduction Bilateral    Raise arms out and up at same speed with palms up. Keep elbows straight. hold 1 lbs can stop at shoulder height Do not shrug shoulders. __10_ reps per set, __1_ sets per day, _7__ days per week   Copyright  VHI. All rights reserved.

## 2023-08-01 NOTE — Therapy (Signed)
 OUTPATIENT OCCUPATIONAL THERAPY NEURO Treatment  Patient Name: Aimee Hood MRN: [id number] DOB:January 08, 1941, 83 y.o., female Today's Date: 08/01/2023  PCP: Dr. Elayne Greaser PROVIDER:Guiloud MD  OCCUPATIONAL THERAPY DISCHARGE SUMMARY   Current functional level related to goals / functional outcomes: Pt met all goals, she agrees with plans for d/c.   Remaining deficits: decreased strength, decreased balance   Education / Equipment: Pt was educated regarding HEP. she dmeonstrates understanding. Therapist recommends pt requests clearance from MD prior to return to driving. Therapist recommends that if pt is cleared for driving she should start out driving with her son initally. Pt was also provided wirh driver evaluation information.    Patient agrees to discharge. Patient goals were met. Patient is being discharged due to meeting the stated rehab goals..    END OF SESSION:  OT End of Session - 08/01/23 1004     Visit Number 6    Number of Visits 25    Date for OT Re-Evaluation 09/29/23    Authorization Type HTA    Authorization - Visit Number 6    Progress Note Due on Visit 10    OT Start Time 0847    OT Stop Time [phone]    OT Time Calculation (min) 36 min                Past Medical History:  Diagnosis Date   Allergy    Arthritis    "all over" (07/10/2013)   CAD (coronary artery disease)    a. normal cors by cath in 2016  b. 08/2016: admitted with an NSTEMI. Cath showed mild nonobstructive CAD with only 20% mid-LAD stenosis. EF was reduced to 35% and wall motion abnormalities were suggestive of a stress-induced cardiomyopathy.   Carpal tunnel syndrome of left wrist    Cerebral aneurysm, nonruptured    3-4 mm in size, stable since 2004 (most recent MRI 2014)   Colon polyp 2007   Diverticulitis    Family history of anesthesia complication    "brother had PONV"   Gastroesophageal reflux disease    High cholesterol    Hypertension    Insomnia    Macular  degeneration 08/31/2016   PONV (postoperative nausea and vomiting)    Restless leg syndrome    Stroke [facility]) 02/2013   denies residual on 07/10/2013)   Past Surgical History:  Procedure Laterality Date   BREAST SURGERY     CHOLECYSTECTOMY     JOINT REPLACEMENT     KNEE ARTHROSCOPY Bilateral    LEFT HEART CATH AND CORONARY ANGIOGRAPHY N/A 09/01/2016   Procedure: Left Heart Cath and Coronary Angiography;  Surgeon: Wenona Hamilton, MD;  Location: [facility] INVASIVE CV LAB;  Service: Cardiovascular;  Laterality: N/A;   LEFT HEART CATHETERIZATION WITH CORONARY ANGIOGRAM N/A 06/20/2014   Procedure: LEFT HEART CATHETERIZATION WITH CORONARY ANGIOGRAM;  Surgeon: Hugh Madura, MD;  Location: [facility] CATH LAB;  Service: Cardiovascular;  Laterality: N/A;   TEE WITHOUT CARDIOVERSION N/A 03/14/2013   Procedure: TRANSESOPHAGEAL ECHOCARDIOGRAM (TEE);  Surgeon: Dorothye Gathers, MD;  Location: [facility] ENDOSCOPY;  Service: Cardiovascular;  Laterality: N/A;   TOTAL KNEE ARTHROPLASTY Left 07/10/2013   TOTAL KNEE ARTHROPLASTY Left 07/10/2013   Procedure: TOTAL KNEE ARTHROPLASTY;  Surgeon: Shirlee Dotter, MD;  Location: [facility] OR;  Service: Orthopedics;  Laterality: Left;   VAGINAL HYSTERECTOMY  2000's   Patient Active Problem List   Diagnosis Date Noted   CVA (cerebral vascular accident) (HCC) 07/05/2023   Left-sided weakness 07/04/2023   HLD (  hyperlipidemia) 07/04/2023   GAD (generalized anxiety disorder) 07/04/2023   Impingement syndrome of right shoulder 10/28/2021   Carpal tunnel syndrome, bilateral 12/29/2018   Pain in right knee 12/05/2018   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   NSTEMI (non-ST elevated myocardial infarction) (HCC)    Chest pain with moderate risk of acute coronary syndrome 08/31/2016   Normal coronary arteries-2016 08/31/2016   Chest pain on exertion 06/11/2014   Osteoarthritis of left knee 07/12/2013   S/P total knee replacement using cement 07/10/2013   Aneurysm, cerebral, nonruptured 04/23/2013    History of LMCA CVA-TPA 2014 03/09/2013   Osteoarthritis 11/14/2012   Restless legs syndrome 11/14/2012   GERD (gastroesophageal reflux disease) 11/13/2012   Essential hypertension 11/05/2012   Other and unspecified hyperlipidemia 11/05/2012    ONSET DATE: 07/04/23  REFERRING DIAG: I63.9 (ICD-10-CM) - Cerebrovascular accident (CVA), unspecified mechanism (HCC)  THERAPY DIAG:  Other lack of coordination  Muscle weakness (generalized)  Unsteadiness on feet  Other abnormalities of gait and mobility  Attention and concentration deficit  Rationale for Evaluation and Treatment: Rehabilitation  SUBJECTIVE:   SUBJECTIVE STATEMENT: Pt reports she feels ready for d/c Pt accompanied by: family member  PERTINENT HISTORY: 83 y.o. right-handed female hospitalized 3/24 with hx of remote left MCA distribution cortical stroke presenting with aphasia s/p tPA in 2014 without residual deficit, essential hypertension, hyperlipidemia, NSTEMI in 2018, diverticulitis, GERD, OA, and RLS hospitalized 3/24 for evaluation of left-sided weakness.  MR Brain:Positive for a small 11 mm Acute Infarct in the posterior right  corona radiata tracking toward the insula  PRECAUTIONS: fall WEIGHT BEARING RESTRICTIONS: No  PAIN:  Are you having pain? No  FALLS: Has patient fallen in last 6 months? No  LIVING ENVIRONMENT: Lives with: lives with their family Lives in: House/apartment Stairs:  2 steps  Has following equipment at home: Single point cane, shower chair, and BSC  PLOF: Independent  PATIENT GOALS: to be more independent  OBJECTIVE:  Note: Objective measures were completed at Evaluation unless otherwise noted.  HAND DOMINANCE: Right  ADLs: Overall ADLs: Supervision for basic ADLS Transfers/ambulation related to ADLs:  Tub Shower transfers: supervision, shower seat Equipment: Shower seat with back  IADLs:  Light housekeeping: family performs Meal Prep: has not attempted , cooked  previously [employer] mobility: supervision with rollator Medication management: son is Brewing technologist: son is Database administrator: 100% legible  MOBILITY STATUS:  supervision with rollator      UPPER EXTREMITY ROM:  RUE is grossly WFLS  Active ROM Right eval Left eval  Shoulder flexion  100  Shoulder abduction  80  Shoulder adduction    Shoulder extension    Shoulder internal rotation    Shoulder external rotation    Elbow flexion  WFLs  Elbow extension  -10  Wrist flexion    Wrist extension    Wrist ulnar deviation    Wrist radial deviation    Wrist pronation    Wrist supination    (Blank rows = not tested)  UPPER EXTREMITY MMT:     MMT Right eval Left eval  Shoulder flexion 4-/5 3+/5  Shoulder abduction    Shoulder adduction    Shoulder extension    Shoulder internal rotation    Shoulder external rotation    Middle trapezius    Lower trapezius    Elbow flexion 4/5 3+/5  Elbow extension 4/5 3+/5  Wrist flexion    Wrist extension    Wrist ulnar deviation    Wrist radial deviation  Wrist pronation    Wrist supination    (Blank rows = not tested)  HAND FUNCTION: Grip strength: Right: 28 lbs; Left: 26 lbs  COORDINATION: 9 hole peg test R 28.32, L 32.80 with 1 drop and decreased control   SENSATION: WFL, tingling at times      COGNITION: Overall cognitive status:  hx of aphasia with word finding from inital CVA, increased time for following commands, cognition to addressed within a functional context   VISION ASSESSMENT:Pt denies changes to vision     OBSERVATIONS: Pleasant female motivated to improve accompanied by her son                                                                                                                             TREATMENT DATE: 08/01/23-UBE x 6 mins level 2 for conditioning Reveiwed yellow theraband exercises, 15-20 reps each min v.c initally for positioning, then pt returned  demonstration Therapist added shoulder flexion and abduction with 1 lbs weight to HEP, pt was instructed to use cans at home. Therapist checked progress towards goals in anticipation of d/c.    07/28/23- UBE x 6 mins level 1 for conditioning Weighted cane for shoulder flexion to shoulder height, min v.c shoulder flexion, abduction biceps curls and chest press 10-15 reps each min v.c Gripper set at level 2 resistance to pick up 1 inch blocks with left and right hands, mod drops with LUE, better performance with right, min difficulty drops Dynamic standing balance activity to retrieve graded clothespins 1-8# form lower surface with left then right hand to place on targets in prep for home management, no LOB. Velcro roller x 4 reps each UE for strength in prep for home managment Reveiwed red putty exercises for sustained grip and pinch, min v.c bilateral UE's 07/20/23-UBE x 6 mins level 1 for conditioning Reviewed yellow theraband HEP 2 sets of 10 reps each, min v.c Standing for dynamic reaching using LUE to place graded clothepins (1-8#) for sustained pinch and functional reach, with supervision, no LOB Standing to copy small peg design on vertical surface with LUE for fine motor coordiantion and standing tolerance, min drops, no LOB.  07/18/23-Placing metal pegs 3 different sizes with in hand manipulation then removing for increased fine motor coordiantion, min difficulty, v.c Pt was instructed in beginning yellow theraband HEP, and red putty HEP, see pt instructions.  07/13/23- fine motor coordination activities: flipping and dealing cards with LUE , stacking pennies then manipulating to place in a container, and rotating ball, min difficulty and v.c Cane exercise HEP issued, 10-15 reps min v.c-See pt education Pt performed yellow theraband exercises for low range shoulder horizontal abduction, biceps curls and triceps extension, 10-15 reps each min v.c and demonstration. Therapist did not issue for  home. Pt can benefit from review. copying small peg design for LUE fine motor coordination, min difficulty, v.c to avoid compensation.  07/07/23- eval         PATIENT  EDUCATION: Education details:red theraband exercises, discussed with pt and son recommendation for graduated driving of pt is cleared by neurology, pt provided with driving eval information should she wish to pursue Person educated: Patient Education method: Explanation, demonstration, handout, min v.c Education comprehension: verbalized understanding, returned demonstration  HOME EXERCISE PROGRAM: coordination, cane, beginning yellow theraband   GOALS: Goals reviewed with patient? Yes  SHORT TERM GOALS: Target date: 08/07/23  I with HEP  Goal status: met  2.  Pt will increase bilateral grip strength by 4 lbs for increased functional use.  met, R 40, L 45  3.  Pt will perfrom basic home management with supervision without LOB  Goal status: met  4.  Pt will perfrom basic cooking with min A   Goal status:  met, pt has performed microwave and stovetop cooking    LONG TERM GOALS: Target date: 09/29/23  I with updated HEP  Goal status: met,   2.  Pt will resume home management activities mod I    Goal status: met makes bed, cleans countertop, does laundry   3.  Pt will resume stove top cooking mod I  Goal status: met, per pt son report  4.  Pt will retrieve a 3 lbs object from eye level shelf with LUE demonstrating good control.  Goal status: met, 08/01/23    ASSESSMENT:  CLINICAL IMPRESSION: Pt demonstrates good overall progress. Per pt and sons rpeort pt is perfroming simple cooking at home. She has met her goals and she feels ready for OT d/c with plans to continue working on balance with PT.  PERFORMANCE DEFICITS: in functional skills including ADLs, IADLs, coordination, dexterity, ROM, strength, flexibility, Fine motor control, Gross motor control, mobility, balance, decreased knowledge of  precautions, decreased knowledge of use of DME, and UE functional use, cognitive skills including attention, safety awareness, thought, and understand, and psychosocial skills including coping strategies, environmental adaptation, habits, interpersonal interactions, and routines and behaviors.   IMPAIRMENTS: are limiting patient from ADLs, IADLs, play, leisure, and social participation.   CO-MORBIDITIES: may have co-morbidities  that affects occupational performance. Patient will benefit from skilled OT to address above impairments and improve overall function.  MODIFICATION OR ASSISTANCE TO COMPLETE EVALUATION: No modification of tasks or assist necessary to complete an evaluation.  OT OCCUPATIONAL PROFILE AND HISTORY: Problem focused assessment: Including review of records relating to presenting problem.  CLINICAL DECISION MAKING: LOW - limited treatment options, no task modification necessary  REHAB POTENTIAL: Good  EVALUATION COMPLEXITY: Low    PLAN:  OT FREQUENCY: 2x/week  OT DURATION: 12 weeks POC writtent for 12 weeks to account for scheduling, may d/c sooner based upon progress.  PLANNED INTERVENTIONS: 97168 OT Re-evaluation, 97535 self care/ADL training, 16109 therapeutic exercise, 97530 therapeutic activity, 97112 neuromuscular re-education, 97140 manual therapy, 97113 aquatic therapy, 97035 ultrasound, 97018 paraffin, 60454 moist heat, 97010 cryotherapy, 97014 electrical stimulation unattended, 97129 Cognitive training (first 15 min), 09811 Cognitive training(each additional 15 min), passive range of motion, balance training, functional mobility training, psychosocial skills training, energy conservation, coping strategies training, patient/family education, and DME and/or AE instructions  RECOMMENDED OTHER SERVICES: PT  CONSULTED AND AGREED WITH PLAN OF CARE: Patient and family member/caregiver  PLAN FOR NEXT SESSION: d/c OT   Lilu Mcglown, OT 08/01/2023, 10:04  AM

## 2023-08-03 NOTE — Therapy (Signed)
 OUTPATIENT PHYSICAL THERAPY NEURO TREATMENT   Patient Name: Aimee Hood MRN: [id number] DOB:1940/09/04, 83 y.o., female Today's Date: 08/04/2023   PCP: Myrtha Ates REFERRING PROVIDER: Lasandra Points   END OF SESSION:  PT End of Session - 08/04/23 1226     Visit Number 7    Date for PT Re-Evaluation 10/04/23    Progress Note Due on Visit 10    PT Start Time 1230    PT Stop Time 1315    PT Time Calculation (min) 45 min    Activity Tolerance Patient tolerated treatment well    Behavior During Therapy [facility] for tasks assessed/performed                   Past Medical History:  Diagnosis Date   Allergy    Arthritis    "all over" (07/10/2013)   CAD (coronary artery disease)    a. normal cors by cath in 2016  b. 08/2016: admitted with an NSTEMI. Cath showed mild nonobstructive CAD with only 20% mid-LAD stenosis. EF was reduced to 35% and wall motion abnormalities were suggestive of a stress-induced cardiomyopathy.   Carpal tunnel syndrome of left wrist    Cerebral aneurysm, nonruptured    3-4 mm in size, stable since 2004 (most recent MRI 2014)   Colon polyp 2007   Diverticulitis    Family history of anesthesia complication    "brother had PONV"   Gastroesophageal reflux disease    High cholesterol    Hypertension    Insomnia    Macular degeneration 08/31/2016   PONV (postoperative nausea and vomiting)    Restless leg syndrome    Stroke [facility]) 02/2013   denies residual on 07/10/2013)   Past Surgical History:  Procedure Laterality Date   BREAST SURGERY     CHOLECYSTECTOMY     JOINT REPLACEMENT     KNEE ARTHROSCOPY Bilateral    LEFT HEART CATH AND CORONARY ANGIOGRAPHY N/A 09/01/2016   Procedure: Left Heart Cath and Coronary Angiography;  Surgeon: Wenona Hamilton, MD;  Location: [facility] INVASIVE CV LAB;  Service: Cardiovascular;  Laterality: N/A;   LEFT HEART CATHETERIZATION WITH CORONARY ANGIOGRAM N/A 06/20/2014   Procedure: LEFT HEART CATHETERIZATION WITH CORONARY  ANGIOGRAM;  Surgeon: Hugh Madura, MD;  Location: [facility] CATH LAB;  Service: Cardiovascular;  Laterality: N/A;   TEE WITHOUT CARDIOVERSION N/A 03/14/2013   Procedure: TRANSESOPHAGEAL ECHOCARDIOGRAM (TEE);  Surgeon: Dorothye Gathers, MD;  Location: [facility] ENDOSCOPY;  Service: Cardiovascular;  Laterality: N/A;   TOTAL KNEE ARTHROPLASTY Left 07/10/2013   TOTAL KNEE ARTHROPLASTY Left 07/10/2013   Procedure: TOTAL KNEE ARTHROPLASTY;  Surgeon: Shirlee Dotter, MD;  Location: [facility] OR;  Service: Orthopedics;  Laterality: Left;   VAGINAL HYSTERECTOMY  2000's   Patient Active Problem List   Diagnosis Date Noted   CVA (cerebral vascular accident) (HCC) 07/05/2023   Left-sided weakness 07/04/2023   HLD (hyperlipidemia) 07/04/2023   GAD (generalized anxiety disorder) 07/04/2023   Impingement syndrome of right shoulder 10/28/2021   Carpal tunnel syndrome, bilateral 12/29/2018   Pain in right knee 12/05/2018   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   NSTEMI (non-ST elevated myocardial infarction) (HCC)    Chest pain with moderate risk of acute coronary syndrome 08/31/2016   Normal coronary arteries-2016 08/31/2016   Chest pain on exertion 06/11/2014   Osteoarthritis of left knee 07/12/2013   S/P total knee replacement using cement 07/10/2013   Aneurysm, cerebral, nonruptured 04/23/2013   History of LMCA CVA-TPA 2014 03/09/2013   Osteoarthritis 11/14/2012  Restless legs syndrome 11/14/2012   GERD (gastroesophageal reflux disease) 11/13/2012   Essential hypertension 11/05/2012   Other and unspecified hyperlipidemia 11/05/2012    ONSET DATE: 07/04/23  REFERRING DIAG:  I63.9 (ICD-10-CM) - Cerebrovascular accident (CVA), unspecified mechanism (HCC)      THERAPY DIAG:  Other lack of coordination  Muscle weakness (generalized)  Unsteadiness on feet  Other abnormalities of gait and mobility  Rationale for Evaluation and Treatment: Rehabilitation  SUBJECTIVE:                                                                                                                                                                                              SUBJECTIVE STATEMENT: I graduated, I can drive now. That is a relief.    Pt accompanied by:  son  PERTINENT HISTORY: 07/04/23--[name] is a 83 y.o. person living with a history of hypertension, hyperlipidemia, GAD, GERD, obesity, vitamin B12 deficiency, vitamin D  deficiency, recurrent epistaxis, remote left MCA distribution cortical stroke in 2014 who presented with new left sided weakness and admitted for Stroke workup on hospital day 0. MRIBrain:Positive for a small 11 mm Acute Infarct in the posterior right corona radiata tracking toward the insula   Hx of LMCA in 2014 L knee TKA 2015   PAIN:  Are you having pain? No 0/10  PRECAUTIONS: Fall  RED FLAGS: None   WEIGHT BEARING RESTRICTIONS: No  FALLS: Has patient fallen in last 6 months? No  LIVING ENVIRONMENT: Lives with: lives with their son Lives in: House/apartment Stairs: Yes: External: 2 steps; has a ramp that she uses  Has following equipment at home: Single point cane, Walker - 4 wheeled, and Wheelchair (manual)  PLOF: Independent  PATIENT GOALS: just be normal, patient's son states that she gets tired after putting on clothes so it would be a good goal to work on her endurance  OBJECTIVE:  Note: Objective measures were completed at Evaluation unless otherwise noted.  DIAGNOSTIC FINDINGS: CT head code stroke without contrast IMPRESSION: 1. No evidence of an acute intracranial abnormality. 2. Parenchymal atrophy, chronic small vessel ischemic disease and chronic infarcts, as described. 3. Mild generalized cerebral atrophy. 4. Paranasal sinus disease as outlined. Correlate for signs/symptoms of acute sinusitis. 5. Nasal septal defect.   CT Angio Head and neck with and without contrast IMPRESSION: CTA neck:   1. The common carotid and internal carotid arteries are patent  within the neck without stenosis or significant atherosclerotic disease. 2. Venous reflux of contrast partially obscures the left vertebral artery V1 segment. Within this limitation, the vertebral arteries are patent within the neck without stenosis  or significant atherosclerotic disease. 3. Aortic Atherosclerosis (ICD10-I70.0).   CTA head:   1. No proximal intracranial large vessel occlusion identified. 2. Intracranial atherosclerotic disease with multifocal stenoses, most notably as follows. 3. Severe stenosis within the right PCA P2 segment. 4. Severe stenosis within the right PCA P3 segment. 5. Moderate stenosis within the left PCA at the P1/P2 junction. 6. 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     MR Brain IMPRESSION: 1. Positive for a small 11 mm Acute Infarct in the posterior right corona radiata tracking toward the insula. No associated hemorrhage or mass effect. 2. Progressed chronic ischemic disease since a 2020 MRI, which is otherwise most pronounced in the posterior left MCA territory.  COGNITION: Overall cognitive status: Within functional limits for tasks assessed   SENSATION: WFL   POSTURE: rounded shoulders, forward head, and increased thoracic kyphosis  LOWER EXTREMITY ROM:   all WNL   LOWER EXTREMITY MMT:  grossly 4/5 BLE    TRANSFERS: Assistive device utilized: Environmental consultant - 4 wheeled  Sit to stand: Modified independence Stand to sit: Modified independence  GAIT: Gait pattern: step to pattern, decreased stride length, ataxic, narrow BOS, poor foot clearance- Right, and poor foot clearance- Left Distance walked: in clinic distances Assistive device utilized: Environmental consultant - 4 wheeled Level of assistance: Complete Independence   FUNCTIONAL TESTS:  5 times sit to stand: 22.38s Timed up and go (TUG): 26.97s with rollator, 28.32s without AD  Berg Balance Scale: 43/56                                                                                                                              TREATMENT DATE:  08/04/23 NuStep L5x58mins  Walking without AD 2 big lap with weight, 2 laps without  Tandem stance in bars SLS in bars  Cone taps on airex  Step ups 6" 1HRA On airex ball toss- slight LOB onto toes, 1 full LOB STS on airex with chest press red ball 2x8  08/01/23:  Neuromuscular re education:  Gait 500' without device, 2 x weaving to L noted, with answering questions, head turn TUG, 13.58 sec  5 x sit to stand, no UE support  Balance activities to address stability with L LE: Standing R foot on airex, L foot on floor, static and with head turns 15 sec each Tandem standing with R UE support, able to manage without UE support 15 sec Side stepping along length of mat 6 x Walking 20' intervals with horizontal head turns 4 x Standing with L foot on 4" step with horizontal head turns 30 sec     07/28/23 NuStep L5x65mins  Walking on beam needs bars Marching on airex min-modA  Walking without device 2 laps with 1 rest break  STS on airex 2x10 Calf raises 2x12 Side steps  Red TB horizontal abd for posture 2x10   07/21/23 NuStep L5x57mins  Obstacles course around cones,  over half foam roll, and on airex without walker min-modA Side steps on beam  Step ups 6" 1 handrail use  Cone taps on airex needs UE help  STS with chest press holding red ball  Standing on airex rows and ext red band 2x10 On airex reaching for different targets   07/19/23  Nustep L5x8 minutes all four extremities for w/u and neural priming STS with 3# x10 no UEs  Weighted rollator pushes for endurance/strength: 250ft x2 with 22#, then 279ft with 36# Forward step ups 4 inch step x12 B  Gait no device close S x247ft  Multiple rounds of gait without walker between chair and // bars   Tandem stance blue foam pad 2x30 seconds B  Tandem gait in // bars solid surface forward and backwards x4 rounds   07/13/23 NuStep  L5x72mins Marching with rollator 2# 2x10 Hip abd with rollator 2# 2x10  LAQ 2# 2x10 HS curls red 2x10 STS 2x10 elevated mat table  Box taps 4"  Step ups 4" Side steps in bars  Side steps over obstacles in bars Horizontal abduction 2x10 for postural strengthening    07/12/23 EVAL    PATIENT EDUCATION: Education details: POC, HEP Person educated: Patient and Child(ren) Education method: Medical illustrator Education comprehension: verbalized understanding and returned demonstration  HOME EXERCISE PROGRAM: Access Code: [email or web address] URL: [email or web address] Date: 07/12/2023 Prepared by: Donavon Fudge  Exercises - Standing Hip Abduction with Counter Support  - 1 x daily - 7 x weekly - 2 sets - 10 reps - Mini Squat with Counter Support  - 1 x daily - 7 x weekly - 2 sets - 10 reps - Standing March with Counter Support  - 1 x daily - 7 x weekly - 2 sets - 10 reps - Standing Tandem Balance with Counter Support  - 1 x daily - 7 x weekly - 3 reps - 30 hold - Standing Single Leg Stance with Counter Support  - 1 x daily - 7 x weekly - 5 reps - 10 hold  GOALS: Goals reviewed with patient? Yes  SHORT TERM GOALS: Target date: 08/23/23  Patient will be independent with initial HEP. Baseline: 07/12/23- given Goal status: MET 07/28/23  2.  Patient will demonstrate decreased fall risk by scoring < 15 sec on TUG with AD. Baseline: 26.97s Goal status: IN PROGRESS 19s 07/28/23, MET    LONG TERM GOALS: Target date: 10/04/23  Patient will be independent with advanced/ongoing HEP to improve outcomes and carryover.  Baseline:  Goal status: INITIAL  2.  Patient will be able to ambulate 300' without AD and good safety to access community.  Baseline: using rollator at eval Goal status: MET 08/04/23   3.  Patient will demonstrate improved functional LE strength as demonstrated by 5xSTS w/o UE push off <15s. Baseline: 22.38s Goal status:08/01/23:  MET so far  12.74 sec   4.   Patient will score 51 on Berg Balance test to demonstrate lower risk of falls. (MCID= 8 points) Baseline: 43 Goal status: INITIAL  5.  Patient will demonstrate tandem stance >30s and SLS > 10s independently  Baseline:  Goal status: IN PROGRESS 08/04/23  6.  Patient will demonstrate decreased fall risk by scoring < 15 sec on TUG without AD. Baseline: 28.32s Goal status: 08/01/23: MET so far 13.58 sec  ASSESSMENT:  CLINICAL IMPRESSION: She is progressing well with PT as well, evidenced by goal progression and being able to walk without her walker.  Her doctor has cleared  her to drive. She has some instability L LE with balance challenges, turns, without UE support. She does has slight veering to the left when walking without AD. Still needs practice with SLS and tandem stance. minA needed with cone taps on airex, she tends to lose her balance backwards. Fatigue noted at end of session with STS.     Patient is a 83 y.o. female who was seen today for physical therapy evaluation and treatment for CVA. She is currently ambulating with a rollator but was fully independently prior to the stroke on 07/04/23. With assessments completed today, she does present as a fall risk. She was able to ambulate a few feet without the rollator very carefully and slowly. She would like to be able to return to do her daily 30 min walks. Patient will benefit from skilled PT to address her balance, strength, and gait abnormalities to improve her confidence, increase her functional independence and allow her to return to PLOF.   OBJECTIVE IMPAIRMENTS: Abnormal gait, decreased activity tolerance, decreased balance, decreased endurance, difficulty walking, decreased strength, improper body mechanics, postural dysfunction, and pain.   ACTIVITY LIMITATIONS: squatting, stairs, transfers, and locomotion level  PARTICIPATION LIMITATIONS: cleaning, laundry, and yard work  [facility] POTENTIAL: Good  CLINICAL DECISION MAKING:  Stable/uncomplicated  EVALUATION COMPLEXITY: Low  PLAN:  PT FREQUENCY: 2x/week  PT DURATION: 12 weeks  PLANNED INTERVENTIONS: [id number]-Therapeutic exercises, 97530- Therapeutic activity, [id number]- Neuromuscular re-education, 97535- Self Care, [id number]- Manual therapy, [phone]- Gait training, Patient/Family education, Balance training, Stair training, Cryotherapy, and Moist heat  PLAN FOR NEXT SESSION: functional strengthening and balance training, try to do some walking without rollator, challenge endurance   Donavon Fudge PT, DPT 08/04/23 1:11 PM

## 2023-08-04 ENCOUNTER — Ambulatory Visit

## 2023-08-04 ENCOUNTER — Ambulatory Visit: Admitting: Occupational Therapy

## 2023-08-04 DIAGNOSIS — R2689 Other abnormalities of gait and mobility: Secondary | ICD-10-CM

## 2023-08-04 DIAGNOSIS — M6281 Muscle weakness (generalized): Secondary | ICD-10-CM

## 2023-08-04 DIAGNOSIS — R2681 Unsteadiness on feet: Secondary | ICD-10-CM

## 2023-08-04 DIAGNOSIS — I639 Cerebral infarction, unspecified: Secondary | ICD-10-CM | POA: Diagnosis not present

## 2023-08-04 DIAGNOSIS — R278 Other lack of coordination: Secondary | ICD-10-CM

## 2023-08-08 ENCOUNTER — Ambulatory Visit: Admitting: Occupational Therapy

## 2023-08-08 ENCOUNTER — Ambulatory Visit

## 2023-08-08 DIAGNOSIS — R278 Other lack of coordination: Secondary | ICD-10-CM

## 2023-08-08 DIAGNOSIS — M6281 Muscle weakness (generalized): Secondary | ICD-10-CM

## 2023-08-08 DIAGNOSIS — I639 Cerebral infarction, unspecified: Secondary | ICD-10-CM | POA: Diagnosis not present

## 2023-08-08 DIAGNOSIS — R2689 Other abnormalities of gait and mobility: Secondary | ICD-10-CM

## 2023-08-08 DIAGNOSIS — R2681 Unsteadiness on feet: Secondary | ICD-10-CM

## 2023-08-08 NOTE — Therapy (Signed)
 OUTPATIENT PHYSICAL THERAPY NEURO TREATMENT   Patient Name: Aimee Hood MRN: [id number] DOB:[birth year], 83 y.o., female Today's Date: 08/08/2023   PCP: Myrtha Ates REFERRING PROVIDER: Lasandra Points   END OF SESSION:  PT End of Session - 08/08/23 1309     Visit Number 8    Date for PT Re-Evaluation 10/04/23    Progress Note Due on Visit 10    PT Start Time 1315    PT Stop Time 1400    PT Time Calculation (min) 45 min    Activity Tolerance Patient tolerated treatment well    Behavior During Therapy [facility] for tasks assessed/performed                    Past Medical History:  Diagnosis Date   Allergy    Arthritis    "all over" (07/10/2013)   CAD (coronary artery disease)    a. normal cors by cath in 2016  b. 08/2016: admitted with an NSTEMI. Cath showed mild nonobstructive CAD with only 20% mid-LAD stenosis. EF was reduced to 35% and wall motion abnormalities were suggestive of a stress-induced cardiomyopathy.   Carpal tunnel syndrome of left wrist    Cerebral aneurysm, nonruptured    3-4 mm in size, stable since 2004 (most recent MRI 2014)   Colon polyp 2007   Diverticulitis    Family history of anesthesia complication    "brother had PONV"   Gastroesophageal reflux disease    High cholesterol    Hypertension    Insomnia    Macular degeneration 08/31/2016   PONV (postoperative nausea and vomiting)    Restless leg syndrome    Stroke [facility]) 02/2013   denies residual on 07/10/2013)   Past Surgical History:  Procedure Laterality Date   BREAST SURGERY     CHOLECYSTECTOMY     JOINT REPLACEMENT     KNEE ARTHROSCOPY Bilateral    LEFT HEART CATH AND CORONARY ANGIOGRAPHY N/A 09/01/2016   Procedure: Left Heart Cath and Coronary Angiography;  Surgeon: Wenona Hamilton, MD;  Location: [facility] INVASIVE CV LAB;  Service: Cardiovascular;  Laterality: N/A;   LEFT HEART CATHETERIZATION WITH CORONARY ANGIOGRAM N/A 06/20/2014   Procedure: LEFT HEART CATHETERIZATION WITH  CORONARY ANGIOGRAM;  Surgeon: Hugh Madura, MD;  Location: [facility] CATH LAB;  Service: Cardiovascular;  Laterality: N/A;   TEE WITHOUT CARDIOVERSION N/A 03/14/2013   Procedure: TRANSESOPHAGEAL ECHOCARDIOGRAM (TEE);  Surgeon: Dorothye Gathers, MD;  Location: [facility] ENDOSCOPY;  Service: Cardiovascular;  Laterality: N/A;   TOTAL KNEE ARTHROPLASTY Left 07/10/2013   TOTAL KNEE ARTHROPLASTY Left 07/10/2013   Procedure: TOTAL KNEE ARTHROPLASTY;  Surgeon: Shirlee Dotter, MD;  Location: [facility] OR;  Service: Orthopedics;  Laterality: Left;   VAGINAL HYSTERECTOMY  2000's   Patient Active Problem List   Diagnosis Date Noted   CVA (cerebral vascular accident) (HCC) 07/05/2023   Left-sided weakness 07/04/2023   HLD (hyperlipidemia) 07/04/2023   GAD (generalized anxiety disorder) 07/04/2023   Impingement syndrome of right shoulder 10/28/2021   Carpal tunnel syndrome, bilateral 12/29/2018   Pain in right knee 12/05/2018   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   NSTEMI (non-ST elevated myocardial infarction) (HCC)    Chest pain with moderate risk of acute coronary syndrome 08/31/2016   Normal coronary arteries-2016 08/31/2016   Chest pain on exertion 06/11/2014   Osteoarthritis of left knee 07/12/2013   S/P total knee replacement using cement 07/10/2013   Aneurysm, cerebral, nonruptured 04/23/2013   History of LMCA CVA-TPA 2014 03/09/2013   Osteoarthritis  11/14/2012   Restless legs syndrome 11/14/2012   GERD (gastroesophageal reflux disease) 11/13/2012   Essential hypertension 11/05/2012   Other and unspecified hyperlipidemia 11/05/2012    ONSET DATE: 07/04/23  REFERRING DIAG:  I63.9 (ICD-10-CM) - Cerebrovascular accident (CVA), unspecified mechanism (HCC)      THERAPY DIAG:  Other lack of coordination  Muscle weakness (generalized)  Unsteadiness on feet  Other abnormalities of gait and mobility  Rationale for Evaluation and Treatment: Rehabilitation  SUBJECTIVE:                                                                                                                                                                                              SUBJECTIVE STATEMENT: Doing good. Taking something with me when I go out, usually the cane. I take the walker to church sometimes. But I try not to use anything, because I don't like to.    Pt accompanied by:  son  PERTINENT HISTORY: 07/04/23--Aimee Hood is a 83 y.o. person living with a history of hypertension, hyperlipidemia, GAD, GERD, obesity, vitamin B12 deficiency, vitamin D  deficiency, recurrent epistaxis, remote left MCA distribution cortical stroke in 2014 who presented with new left sided weakness and admitted for Stroke workup on hospital day 0. MRIBrain:Positive for a small 11 mm Acute Infarct in the posterior right corona radiata tracking toward the insula   Hx of LMCA in 2014 L knee TKA 2015   PAIN:  Are you having pain? No 0/10  PRECAUTIONS: Fall  RED FLAGS: None   WEIGHT BEARING RESTRICTIONS: No  FALLS: Has patient fallen in last 6 months? No  LIVING ENVIRONMENT: Lives with: lives with their son Lives in: House/apartment Stairs: Yes: External: 2 steps; has a ramp that she uses  Has following equipment at home: Single point cane, Walker - 4 wheeled, and Wheelchair (manual)  PLOF: Independent  PATIENT GOALS: just be normal, patient's son states that she gets tired after putting on clothes so it would be a good goal to work on her endurance  OBJECTIVE:  Note: Objective measures were completed at Evaluation unless otherwise noted.  DIAGNOSTIC FINDINGS: CT head code stroke without contrast IMPRESSION: 1. No evidence of an acute intracranial abnormality. 2. Parenchymal atrophy, chronic small vessel ischemic disease and chronic infarcts, as described. 3. Mild generalized cerebral atrophy. 4. Paranasal sinus disease as outlined. Correlate for signs/symptoms of acute sinusitis. 5. Nasal septal defect.   CT Angio Head  and neck with and without contrast IMPRESSION: CTA neck:   1. The common carotid and internal carotid arteries are patent within the neck without stenosis or significant atherosclerotic disease. 2.  Venous reflux of contrast partially obscures the left vertebral artery V1 segment. Within this limitation, the vertebral arteries are patent within the neck without stenosis or significant atherosclerotic disease. 3. Aortic Atherosclerosis (ICD10-I70.0).   CTA head:   1. No proximal intracranial large vessel occlusion identified. 2. Intracranial atherosclerotic disease with multifocal stenoses, most notably as follows. 3. Severe stenosis within the right PCA P2 segment. 4. Severe stenosis within the right PCA P3 segment. 5. Moderate stenosis within the left PCA at the P1/P2 junction. 6. 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     MR Brain IMPRESSION: 1. Positive for a small 11 mm Acute Infarct in the posterior right corona radiata tracking toward the insula. No associated hemorrhage or mass effect. 2. Progressed chronic ischemic disease since a 2020 MRI, which is otherwise most pronounced in the posterior left MCA territory.  COGNITION: Overall cognitive status: Within functional limits for tasks assessed   SENSATION: WFL   POSTURE: rounded shoulders, forward head, and increased thoracic kyphosis  LOWER EXTREMITY ROM:   all WNL   LOWER EXTREMITY MMT:  grossly 4/5 BLE    TRANSFERS: Assistive device utilized: Environmental consultant - 4 wheeled  Sit to stand: Modified independence Stand to sit: Modified independence  GAIT: Gait pattern: step to pattern, decreased stride length, ataxic, narrow BOS, poor foot clearance- Right, and poor foot clearance- Left Distance walked: in clinic distances Assistive device utilized: Environmental consultant - 4 wheeled Level of assistance: Complete Independence   FUNCTIONAL TESTS:  5 times sit to stand: 22.38s Timed up and go  (TUG): 26.97s with rollator, 28.32s without AD  Berg Balance Scale: 43/56                                                                                                                             TREATMENT DATE:  08/08/23 NuStep L5x53mins Walking outdoors [employer], up and down curbs w/o SPC  Leg ext 10# 3x10  HS curls 25# 3x10 3 way cone taps standing on airex- minA and using some occasional UE touches in bars Rockerboard BERG 49/56   08/04/23 NuStep L5x5mins  Walking without AD 2 big lap with weight, 2 laps without  Tandem stance in bars SLS in bars  Cone taps on airex  Step ups 6" 1HRA On airex ball toss- slight LOB onto toes, 1 full LOB STS on airex with chest press red ball 2x8  08/01/23:  Neuromuscular re education:  Gait 500' without device, 2 x weaving to L noted, with answering questions, head turn TUG, 13.58 sec  5 x sit to stand, no UE support  Balance activities to address stability with L LE: Standing R foot on airex, L foot on floor, static and with head turns 15 sec each Tandem standing with R UE support, able to manage without UE support 15 sec Side stepping along length of mat 6 x Walking 20' intervals with horizontal head turns 4  x Standing with L foot on 4" step with horizontal head turns 30 sec     07/28/23 NuStep L5x55mins  Walking on beam needs bars Marching on airex min-modA  Walking without device 2 laps with 1 rest break  STS on airex 2x10 Calf raises 2x12 Side steps  Red TB horizontal abd for posture 2x10   07/21/23 NuStep L5x75mins  Obstacles course around cones, over half foam roll, and on airex without walker min-modA Side steps on beam  Step ups 6" 1 handrail use  Cone taps on airex needs UE help  STS with chest press holding red ball  Standing on airex rows and ext red band 2x10 On airex reaching for different targets   07/19/23  Nustep L5x8 minutes all four extremities for w/u and neural priming STS with 3# x10 no UEs   Weighted rollator pushes for endurance/strength: 244ft x2 with 22#, then 274ft with 36# Forward step ups 4 inch step x12 B  Gait no device close S x262ft  Multiple rounds of gait without walker between chair and // bars   Tandem stance blue foam pad 2x30 seconds B  Tandem gait in // bars solid surface forward and backwards x4 rounds   07/13/23 NuStep L5x34mins Marching with rollator 2# 2x10 Hip abd with rollator 2# 2x10  LAQ 2# 2x10 HS curls red 2x10 STS 2x10 elevated mat table  Box taps 4"  Step ups 4" Side steps in bars  Side steps over obstacles in bars Horizontal abduction 2x10 for postural strengthening    07/12/23 EVAL    PATIENT EDUCATION: Education details: POC, HEP Person educated: Patient and Child(ren) Education method: Medical illustrator Education comprehension: verbalized understanding and returned demonstration  HOME EXERCISE PROGRAM: Access Code: [email or web address] URL: [email or web address] Date: 07/12/2023 Prepared by: Donavon Fudge  Exercises - Standing Hip Abduction with Counter Support  - 1 x daily - 7 x weekly - 2 sets - 10 reps - Mini Squat with Counter Support  - 1 x daily - 7 x weekly - 2 sets - 10 reps - Standing March with Counter Support  - 1 x daily - 7 x weekly - 2 sets - 10 reps - Standing Tandem Balance with Counter Support  - 1 x daily - 7 x weekly - 3 reps - 30 hold - Standing Single Leg Stance with Counter Support  - 1 x daily - 7 x weekly - 5 reps - 10 hold  GOALS: Goals reviewed with patient? Yes  SHORT TERM GOALS: Target date: 08/23/23  Patient will be independent with initial HEP. Baseline: 07/12/23- given Goal status: MET 07/28/23  2.  Patient will demonstrate decreased fall risk by scoring < 15 sec on TUG with AD. Baseline: 26.97s Goal status: IN PROGRESS 19s 07/28/23, MET    LONG TERM GOALS: Target date: 10/04/23  Patient will be independent with advanced/ongoing HEP to improve outcomes and carryover.   Baseline:  Goal status: INITIAL  2.  Patient will be able to ambulate 300' without AD and good safety to access community.  Baseline: using rollator at eval Goal status: MET 08/04/23   3.  Patient will demonstrate improved functional LE strength as demonstrated by 5xSTS w/o UE push off <15s. Baseline: 22.38s Goal status:08/01/23:  MET so far  12.74 sec   4.  Patient will score 51 on Berg Balance test to demonstrate lower risk of falls. (MCID= 8 points) Baseline: 43 Goal status: IN PROGRESS 49- 08/08/23  5.  Patient will demonstrate tandem stance >  30s and SLS > 10s independently  Baseline:  Goal status: IN PROGRESS 08/04/23  6.  Patient will demonstrate decreased fall risk by scoring < 15 sec on TUG without AD. Baseline: 28.32s Goal status: 08/01/23: MET so far 13.58 sec  ASSESSMENT:  CLINICAL IMPRESSION: We walked outdoors today without the cane, she held it in her hand just in case but did not use. She still veers to the left when she walks and has some instances of instability especially on uneven surfaces. Advised her to continue to use an AD outside the home for safety purpose. Balance is still the most challenging from interventions completed today.    EVAL: Patient is a 82 y.o. female who was seen today for physical therapy evaluation and treatment for CVA. She is currently ambulating with a rollator but was fully independently prior to the stroke on 07/04/23. With assessments completed today, she does present as a fall risk. She was able to ambulate a few feet without the rollator very carefully and slowly. She would like to be able to return to do her daily 30 min walks. Patient will benefit from skilled PT to address her balance, strength, and gait abnormalities to improve her confidence, increase her functional independence and allow her to return to PLOF.   OBJECTIVE IMPAIRMENTS: Abnormal gait, decreased activity tolerance, decreased balance, decreased endurance, difficulty  walking, decreased strength, improper body mechanics, postural dysfunction, and pain.   ACTIVITY LIMITATIONS: squatting, stairs, transfers, and locomotion level  PARTICIPATION LIMITATIONS: cleaning, laundry, and yard work  [facility] POTENTIAL: Good  CLINICAL DECISION MAKING: Stable/uncomplicated  EVALUATION COMPLEXITY: Low  PLAN:  PT FREQUENCY: 2x/week  PT DURATION: 12 weeks  PLANNED INTERVENTIONS: 97110-Therapeutic exercises, 97530- Therapeutic activity, [id number]- Neuromuscular re-education, 97535- Self Care, [id number]- Manual therapy, [phone]- Gait training, Patient/Family education, Balance training, Stair training, Cryotherapy, and Moist heat  PLAN FOR NEXT SESSION: functional strengthening and balance training, try to do some walking without rollator, challenge endurance   Donavon Fudge PT, DPT 08/08/23 1:56 PM

## 2023-08-09 NOTE — Therapy (Signed)
 OUTPATIENT PHYSICAL THERAPY NEURO TREATMENT   Patient Name: Aimee Hood MRN: [id number] DOB:02-18-1941, 82 y.o., female Today's Date: 08/10/2023   PCP: Myrtha Ates REFERRING PROVIDER: Lasandra Points   END OF SESSION:  PT End of Session - 08/10/23 1312     Visit Number 9    Date for PT Re-Evaluation 10/04/23    Progress Note Due on Visit 10    PT Start Time 1315    PT Stop Time 1400    PT Time Calculation (min) 45 min    Activity Tolerance Patient tolerated treatment well    Behavior During Therapy [facility] for tasks assessed/performed                     Past Medical History:  Diagnosis Date   Allergy    Arthritis    "all over" (07/10/2013)   CAD (coronary artery disease)    a. normal cors by cath in 2016  b. 08/2016: admitted with an NSTEMI. Cath showed mild nonobstructive CAD with only 20% mid-LAD stenosis. EF was reduced to 35% and wall motion abnormalities were suggestive of a stress-induced cardiomyopathy.   Carpal tunnel syndrome of left wrist    Cerebral aneurysm, nonruptured    3-4 mm in size, stable since 2004 (most recent MRI 2014)   Colon polyp 2007   Diverticulitis    Family history of anesthesia complication    "brother had PONV"   Gastroesophageal reflux disease    Hood cholesterol    Hypertension    Insomnia    Macular degeneration 08/31/2016   PONV (postoperative nausea and vomiting)    Restless leg syndrome    Stroke [facility]) 02/2013   denies residual on 07/10/2013)   Past Surgical History:  Procedure Laterality Date   BREAST SURGERY     CHOLECYSTECTOMY     JOINT REPLACEMENT     KNEE ARTHROSCOPY Bilateral    LEFT HEART CATH AND CORONARY ANGIOGRAPHY N/A 09/01/2016   Procedure: Left Heart Cath and Coronary Angiography;  Surgeon: Wenona Hamilton, MD;  Location: [facility] INVASIVE CV LAB;  Service: Cardiovascular;  Laterality: N/A;   LEFT HEART CATHETERIZATION WITH CORONARY ANGIOGRAM N/A 06/20/2014   Procedure: LEFT HEART CATHETERIZATION WITH  CORONARY ANGIOGRAM;  Surgeon: Hugh Madura, MD;  Location: [facility] CATH LAB;  Service: Cardiovascular;  Laterality: N/A;   TEE WITHOUT CARDIOVERSION N/A 03/14/2013   Procedure: TRANSESOPHAGEAL ECHOCARDIOGRAM (TEE);  Surgeon: Dorothye Gathers, MD;  Location: [facility] ENDOSCOPY;  Service: Cardiovascular;  Laterality: N/A;   TOTAL KNEE ARTHROPLASTY Left 07/10/2013   TOTAL KNEE ARTHROPLASTY Left 07/10/2013   Procedure: TOTAL KNEE ARTHROPLASTY;  Surgeon: Shirlee Dotter, MD;  Location: [facility] OR;  Service: Orthopedics;  Laterality: Left;   VAGINAL HYSTERECTOMY  2000's   Patient Active Problem List   Diagnosis Date Noted   CVA (cerebral vascular accident) (HCC) 07/05/2023   Left-sided weakness 07/04/2023   HLD (hyperlipidemia) 07/04/2023   GAD (generalized anxiety disorder) 07/04/2023   Impingement syndrome of right shoulder 10/28/2021   Carpal tunnel syndrome, bilateral 12/29/2018   Pain in right knee 12/05/2018   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   NSTEMI (non-ST elevated myocardial infarction) (HCC)    Chest pain with moderate risk of acute coronary syndrome 08/31/2016   Normal coronary arteries-2016 08/31/2016   Chest pain on exertion 06/11/2014   Osteoarthritis of left knee 07/12/2013   S/P total knee replacement using cement 07/10/2013   Aneurysm, cerebral, nonruptured 04/23/2013   History of LMCA CVA-TPA 2014 03/09/2013  Osteoarthritis 11/14/2012   Restless legs syndrome 11/14/2012   GERD (gastroesophageal reflux disease) 11/13/2012   Essential hypertension 11/05/2012   Other and unspecified hyperlipidemia 11/05/2012    ONSET DATE: 07/04/23  REFERRING DIAG:  I63.9 (ICD-10-CM) - Cerebrovascular accident (CVA), unspecified mechanism (HCC)      THERAPY DIAG:  Other lack of coordination  Muscle weakness (generalized)  Unsteadiness on feet  Other abnormalities of gait and mobility  Cerebrovascular accident (CVA), unspecified mechanism (HCC)  Rationale for Evaluation and Treatment:  Rehabilitation  SUBJECTIVE:                                                                                                                                                                                             SUBJECTIVE STATEMENT: Doing good, no problems.   Pt accompanied by:  son  PERTINENT HISTORY: 07/04/23--Aimee Hood is a 83 y.o. person living with a history of hypertension, hyperlipidemia, GAD, GERD, obesity, vitamin B12 deficiency, vitamin D  deficiency, recurrent epistaxis, remote left MCA distribution cortical stroke in 2014 who presented with new left sided weakness and admitted for Stroke workup on hospital day 0. MRIBrain:Positive for a small 11 mm Acute Infarct in the posterior right corona radiata tracking toward the insula   Hx of LMCA in 2014 L knee TKA 2015   PAIN:  Are you having pain? No 0/10  PRECAUTIONS: Fall  RED FLAGS: None   WEIGHT BEARING RESTRICTIONS: No  FALLS: Has patient fallen in last 6 months? No  LIVING ENVIRONMENT: Lives with: lives with their son Lives in: House/apartment Stairs: Yes: External: 2 steps; has a ramp that she uses  Has following equipment at home: Single point cane, Walker - 4 wheeled, and Wheelchair (manual)  PLOF: Independent  PATIENT GOALS: just be normal, patient's son states that she gets tired after putting on clothes so it would be a good goal to work on her endurance  OBJECTIVE:  Note: Objective measures were completed at Evaluation unless otherwise noted.  DIAGNOSTIC FINDINGS: CT head code stroke without contrast IMPRESSION: 1. No evidence of an acute intracranial abnormality. 2. Parenchymal atrophy, chronic small vessel ischemic disease and chronic infarcts, as described. 3. Mild generalized cerebral atrophy. 4. Paranasal sinus disease as outlined. Correlate for signs/symptoms of acute sinusitis. 5. Nasal septal defect.   CT Angio Head and neck with and without contrast IMPRESSION: CTA neck:   1. The  common carotid and internal carotid arteries are patent within the neck without stenosis or significant atherosclerotic disease. 2. Venous reflux of contrast partially obscures the left vertebral artery V1 segment. Within this limitation, the vertebral arteries are patent within  the neck without stenosis or significant atherosclerotic disease. 3. Aortic Atherosclerosis (ICD10-I70.0).   CTA head:   1. No proximal intracranial large vessel occlusion identified. 2. Intracranial atherosclerotic disease with multifocal stenoses, most notably as follows. 3. Severe stenosis within the right PCA P2 segment. 4. Severe stenosis within the right PCA P3 segment. 5. Moderate stenosis within the left PCA at the P1/P2 junction. 6. 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     MR Brain IMPRESSION: 1. Positive for a small 11 mm Acute Infarct in the posterior right corona radiata tracking toward the insula. No associated hemorrhage or mass effect. 2. Progressed chronic ischemic disease since a 2020 MRI, which is otherwise most pronounced in the posterior left MCA territory.  COGNITION: Overall cognitive status: Within functional limits for tasks assessed   SENSATION: WFL   POSTURE: rounded shoulders, forward head, and increased thoracic kyphosis  LOWER EXTREMITY ROM:   all WNL   LOWER EXTREMITY MMT:  grossly 4/5 BLE    TRANSFERS: Assistive device utilized: Environmental consultant - 4 wheeled  Sit to stand: Modified independence Stand to sit: Modified independence  GAIT: Gait pattern: step to pattern, decreased stride length, ataxic, narrow BOS, poor foot clearance- Right, and poor foot clearance- Left Distance walked: in clinic distances Assistive device utilized: Environmental consultant - 4 wheeled Level of assistance: Complete Independence   FUNCTIONAL TESTS:  5 times sit to stand: 22.38s Timed up and go (TUG): 26.97s with rollator, 28.32s without AD  Berg Balance Scale:  43/56                                                                                                                             TREATMENT DATE:  08/10/23 NuStep L5x59mins  Step up on airex CGA-minA Side steps on airex CGA-minA  On airex stair taps  Resisted gait 10# 3 way x5 forwards and side ways CGA Calf raises 2x10  STS with chest press x10, then OHP x10  Horizontal abd red band 3x10    08/08/23 NuStep L5x40mins Walking outdoors [address], up and down curbs w/o SPC  Leg ext 10# 3x10  HS curls 25# 3x10 3 way cone taps standing on airex- minA and using some occasional UE touches in bars Rockerboard BERG 49/56   08/04/23 NuStep L5x86mins  Walking without AD 2 big lap with weight, 2 laps without  Tandem stance in bars SLS in bars  Cone taps on airex  Step ups 6" 1HRA On airex ball toss- slight LOB onto toes, 1 full LOB STS on airex with chest press red ball 2x8  08/01/23:  Neuromuscular re education:  Gait 500' without device, 2 x weaving to L noted, with answering questions, head turn TUG, 13.58 sec  5 x sit to stand, no UE support  Balance activities to address stability with L LE: Standing R foot on airex, L foot on floor, static and with head turns 15 sec  each Tandem standing with R UE support, able to manage without UE support 15 sec Side stepping along length of mat 6 x Walking 20' intervals with horizontal head turns 4 x Standing with L foot on 4" step with horizontal head turns 30 sec     07/28/23 NuStep L5x78mins  Walking on beam needs bars Marching on airex min-modA  Walking without device 2 laps with 1 rest break  STS on airex 2x10 Calf raises 2x12 Side steps  Red TB horizontal abd for posture 2x10   07/21/23 NuStep L5x14mins  Obstacles course around cones, over half foam roll, and on airex without walker min-modA Side steps on beam  Step ups 6" 1 handrail use  Cone taps on airex needs UE help  STS with chest press holding red ball  Standing on  airex rows and ext red band 2x10 On airex reaching for different targets   07/19/23  Nustep L5x8 minutes all four extremities for w/u and neural priming STS with 3# x10 no UEs  Weighted rollator pushes for endurance/strength: 250ft x2 with 22#, then 251ft with 36# Forward step ups 4 inch step x12 B  Gait no device close S x217ft  Multiple rounds of gait without walker between chair and // bars   Tandem stance blue foam pad 2x30 seconds B  Tandem gait in // bars solid surface forward and backwards x4 rounds   07/13/23 NuStep L5x79mins Marching with rollator 2# 2x10 Hip abd with rollator 2# 2x10  LAQ 2# 2x10 HS curls red 2x10 STS 2x10 elevated mat table  Box taps 4"  Step ups 4" Side steps in bars  Side steps over obstacles in bars Horizontal abduction 2x10 for postural strengthening    07/12/23 EVAL    PATIENT EDUCATION: Education details: POC, HEP Person educated: Patient and Child(ren) Education method: Medical illustrator Education comprehension: verbalized understanding and returned demonstration  HOME EXERCISE PROGRAM: Access Code: [email or web address] URL: [email or web address] Date: 07/12/2023 Prepared by: Donavon Fudge  Exercises - Standing Hip Abduction with Counter Support  - 1 x daily - 7 x weekly - 2 sets - 10 reps - Mini Squat with Counter Support  - 1 x daily - 7 x weekly - 2 sets - 10 reps - Standing March with Counter Support  - 1 x daily - 7 x weekly - 2 sets - 10 reps - Standing Tandem Balance with Counter Support  - 1 x daily - 7 x weekly - 3 reps - 30 hold - Standing Single Leg Stance with Counter Support  - 1 x daily - 7 x weekly - 5 reps - 10 hold  GOALS: Goals reviewed with patient? Yes  SHORT TERM GOALS: Target date: 08/23/23  Patient will be independent with initial HEP. Baseline: 07/12/23- given Goal status: MET 07/28/23  2.  Patient will demonstrate decreased fall risk by scoring < 15 sec on TUG with AD. Baseline: 26.97s Goal  status: IN PROGRESS 19s 07/28/23, MET    LONG TERM GOALS: Target date: 10/04/23  Patient will be independent with advanced/ongoing HEP to improve outcomes and carryover.  Baseline:  Goal status: INITIAL  2.  Patient will be able to ambulate 300' without AD and good safety to access community.  Baseline: using rollator at eval Goal status: MET 08/04/23   3.  Patient will demonstrate improved functional LE strength as demonstrated by 5xSTS w/o UE push off <15s. Baseline: 22.38s Goal status:08/01/23:  MET so far  12.74 sec   4.  Patient will  score 51 on Berg Balance test to demonstrate lower risk of falls. (MCID= 8 points) Baseline: 43 Goal status: IN PROGRESS 49- 08/08/23  5.  Patient will demonstrate tandem stance >30s and SLS > 10s independently  Baseline:  Goal status: IN PROGRESS 08/04/23  6.  Patient will demonstrate decreased fall risk by scoring < 15 sec on TUG without AD. Baseline: 28.32s Goal status: 08/01/23: MET so far 13.58 sec  ASSESSMENT:  CLINICAL IMPRESSION: Patient continues to show good progress, she is walking with a SPC but not really using it, more so just holds it in her hand. Balance tasks on airex are still challenging. She wants to be independent and do many things on her own, but she does have some notable staggering which concerns me that she may still need to work on her balance and gait to avoid any risk for falls.    EVAL: Patient is a 83 y.o. female who was seen today for physical therapy evaluation and treatment for CVA. She is currently ambulating with a rollator but was fully independently prior to the stroke on 07/04/23. With assessments completed today, she does present as a fall risk. She was able to ambulate a few feet without the rollator very carefully and slowly. She would like to be able to return to do her daily 30 min walks. Patient will benefit from skilled PT to address her balance, strength, and gait abnormalities to improve her confidence,  increase her functional independence and allow her to return to PLOF.   OBJECTIVE IMPAIRMENTS: Abnormal gait, decreased activity tolerance, decreased balance, decreased endurance, difficulty walking, decreased strength, improper body mechanics, postural dysfunction, and pain.   ACTIVITY LIMITATIONS: squatting, stairs, transfers, and locomotion level  PARTICIPATION LIMITATIONS: cleaning, laundry, and yard work  [facility] POTENTIAL: Good  CLINICAL DECISION MAKING: Stable/uncomplicated  EVALUATION COMPLEXITY: Low  PLAN:  PT FREQUENCY: 2x/week  PT DURATION: 12 weeks  PLANNED INTERVENTIONS: 97110-Therapeutic exercises, 97530- Therapeutic activity, [id number]- Neuromuscular re-education, 97535- Self Care, [id number]- Manual therapy, [phone]- Gait training, Patient/Family education, Balance training, Stair training, Cryotherapy, and Moist heat  PLAN FOR NEXT SESSION: functional strengthening and balance training, try to do some walking without rollator, challenge endurance   Donavon Fudge PT, DPT 08/10/23 1:55 PM

## 2023-08-10 ENCOUNTER — Ambulatory Visit: Attending: Cardiology

## 2023-08-10 ENCOUNTER — Ambulatory Visit

## 2023-08-10 ENCOUNTER — Ambulatory Visit: Admitting: Occupational Therapy

## 2023-08-10 DIAGNOSIS — M6281 Muscle weakness (generalized): Secondary | ICD-10-CM

## 2023-08-10 DIAGNOSIS — I639 Cerebral infarction, unspecified: Secondary | ICD-10-CM | POA: Diagnosis not present

## 2023-08-10 DIAGNOSIS — R2689 Other abnormalities of gait and mobility: Secondary | ICD-10-CM

## 2023-08-10 DIAGNOSIS — R2681 Unsteadiness on feet: Secondary | ICD-10-CM

## 2023-08-10 DIAGNOSIS — R278 Other lack of coordination: Secondary | ICD-10-CM

## 2023-08-11 NOTE — Therapy (Signed)
 OUTPATIENT PHYSICAL THERAPY NEURO TREATMENT Progress Note Reporting Period 07/12/23 to 08/15/23  See note below for Objective Data and Assessment of Progress/Goals.      Patient Name: Aimee Hood MRN: [id number] DOB:1941/02/04, 83 y.o., female Today's Date: 08/15/2023   PCP: Myrtha Ates REFERRING PROVIDER: Lasandra Points   END OF SESSION:  PT End of Session - 08/15/23 0757     Visit Number 10    Date for PT Re-Evaluation 10/04/23    Progress Note Due on Visit 10    PT Start Time 0800    PT Stop Time 0845    PT Time Calculation (min) 45 min    Activity Tolerance Patient tolerated treatment well    Behavior During Therapy [facility] for tasks assessed/performed                      Past Medical History:  Diagnosis Date   Allergy    Arthritis    "all over" (07/10/2013)   CAD (coronary artery disease)    a. normal cors by cath in 2016  b. 08/2016: admitted with an NSTEMI. Cath showed mild nonobstructive CAD with only 20% mid-LAD stenosis. EF was reduced to 35% and wall motion abnormalities were suggestive of a stress-induced cardiomyopathy.   Carpal tunnel syndrome of left wrist    Cerebral aneurysm, nonruptured    3-4 mm in size, stable since 2004 (most recent MRI 2014)   Colon polyp 2007   Diverticulitis    Family history of anesthesia complication    "brother had PONV"   Gastroesophageal reflux disease    High cholesterol    Hypertension    Insomnia    Macular degeneration 08/31/2016   PONV (postoperative nausea and vomiting)    Restless leg syndrome    Stroke [facility]) 02/2013   denies residual on 07/10/2013)   Past Surgical History:  Procedure Laterality Date   BREAST SURGERY     CHOLECYSTECTOMY     JOINT REPLACEMENT     KNEE ARTHROSCOPY Bilateral    LEFT HEART CATH AND CORONARY ANGIOGRAPHY N/A 09/01/2016   Procedure: Left Heart Cath and Coronary Angiography;  Surgeon: Wenona Hamilton, MD;  Location: [facility] INVASIVE CV LAB;  Service: Cardiovascular;   Laterality: N/A;   LEFT HEART CATHETERIZATION WITH CORONARY ANGIOGRAM N/A 06/20/2014   Procedure: LEFT HEART CATHETERIZATION WITH CORONARY ANGIOGRAM;  Surgeon: Hugh Madura, MD;  Location: [facility] CATH LAB;  Service: Cardiovascular;  Laterality: N/A;   TEE WITHOUT CARDIOVERSION N/A 03/14/2013   Procedure: TRANSESOPHAGEAL ECHOCARDIOGRAM (TEE);  Surgeon: Dorothye Gathers, MD;  Location: [facility] ENDOSCOPY;  Service: Cardiovascular;  Laterality: N/A;   TOTAL KNEE ARTHROPLASTY Left 07/10/2013   TOTAL KNEE ARTHROPLASTY Left 07/10/2013   Procedure: TOTAL KNEE ARTHROPLASTY;  Surgeon: Shirlee Dotter, MD;  Location: [facility] OR;  Service: Orthopedics;  Laterality: Left;   VAGINAL HYSTERECTOMY  2000's   Patient Active Problem List   Diagnosis Date Noted   CVA (cerebral vascular accident) (HCC) 07/05/2023   Left-sided weakness 07/04/2023   HLD (hyperlipidemia) 07/04/2023   GAD (generalized anxiety disorder) 07/04/2023   Impingement syndrome of right shoulder 10/28/2021   Carpal tunnel syndrome, bilateral 12/29/2018   Pain in right knee 12/05/2018   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   NSTEMI (non-ST elevated myocardial infarction) (HCC)    Chest pain with moderate risk of acute coronary syndrome 08/31/2016   Normal coronary arteries-2016 08/31/2016   Chest pain on exertion 06/11/2014   Osteoarthritis of left knee 07/12/2013   S/P  total knee replacement using cement 07/10/2013   Aneurysm, cerebral, nonruptured 04/23/2013   History of LMCA CVA-TPA 2014 03/09/2013   Osteoarthritis 11/14/2012   Restless legs syndrome 11/14/2012   GERD (gastroesophageal reflux disease) 11/13/2012   Essential hypertension 11/05/2012   Other and unspecified hyperlipidemia 11/05/2012    ONSET DATE: 07/04/23  REFERRING DIAG:  I63.9 (ICD-10-CM) - Cerebrovascular accident (CVA), unspecified mechanism (HCC)      THERAPY DIAG:  Other lack of coordination  Muscle weakness (generalized)  Unsteadiness on feet  Cerebrovascular  accident (CVA), unspecified mechanism (HCC)  Other abnormalities of gait and mobility  Rationale for Evaluation and Treatment: Rehabilitation  SUBJECTIVE:                                                                                                                                                                                             SUBJECTIVE STATEMENT: Doing good.   Pt accompanied by:  son  PERTINENT HISTORY: 07/04/23--Aimee Hood is a 83 y.o. person living with a history of hypertension, hyperlipidemia, GAD, GERD, obesity, vitamin B12 deficiency, vitamin D  deficiency, recurrent epistaxis, remote left MCA distribution cortical stroke in 2014 who presented with new left sided weakness and admitted for Stroke workup on hospital day 0. MRIBrain:Positive for a small 11 mm Acute Infarct in the posterior right corona radiata tracking toward the insula   Hx of LMCA in 2014 L knee TKA 2015   PAIN:  Are you having pain? No 0/10  PRECAUTIONS: Fall  RED FLAGS: None   WEIGHT BEARING RESTRICTIONS: No  FALLS: Has patient fallen in last 6 months? No  LIVING ENVIRONMENT: Lives with: lives with their son Lives in: House/apartment Stairs: Yes: External: 2 steps; has a ramp that she uses  Has following equipment at home: Single point cane, Walker - 4 wheeled, and Wheelchair (manual)  PLOF: Independent  PATIENT GOALS: just be normal, patient's son states that she gets tired after putting on clothes so it would be a good goal to work on her endurance  OBJECTIVE:  Note: Objective measures were completed at Evaluation unless otherwise noted.  DIAGNOSTIC FINDINGS: CT head code stroke without contrast IMPRESSION: 1. No evidence of an acute intracranial abnormality. 2. Parenchymal atrophy, chronic small vessel ischemic disease and chronic infarcts, as described. 3. Mild generalized cerebral atrophy. 4. Paranasal sinus disease as outlined. Correlate for signs/symptoms of acute  sinusitis. 5. Nasal septal defect.   CT Angio Head and neck with and without contrast IMPRESSION: CTA neck:   1. The common carotid and internal carotid arteries are patent within the neck without stenosis or significant atherosclerotic disease. 2. Venous  reflux of contrast partially obscures the left vertebral artery V1 segment. Within this limitation, the vertebral arteries are patent within the neck without stenosis or significant atherosclerotic disease. 3. Aortic Atherosclerosis (ICD10-I70.0).   CTA head:   1. No proximal intracranial large vessel occlusion identified. 2. Intracranial atherosclerotic disease with multifocal stenoses, most notably as follows. 3. Severe stenosis within the right PCA P2 segment. 4. Severe stenosis within the right PCA P3 segment. 5. Moderate stenosis within the left PCA at the P1/P2 junction. 6. 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     MR Brain IMPRESSION: 1. Positive for a small 11 mm Acute Infarct in the posterior right corona radiata tracking toward the insula. No associated hemorrhage or mass effect. 2. Progressed chronic ischemic disease since a 2020 MRI, which is otherwise most pronounced in the posterior left MCA territory.  COGNITION: Overall cognitive status: Within functional limits for tasks assessed   SENSATION: WFL   POSTURE: rounded shoulders, forward head, and increased thoracic kyphosis  LOWER EXTREMITY ROM:   all WNL   LOWER EXTREMITY MMT:  grossly 4/5 BLE    TRANSFERS: Assistive device utilized: Environmental consultant - 4 wheeled  Sit to stand: Modified independence Stand to sit: Modified independence  GAIT: Gait pattern: step to pattern, decreased stride length, ataxic, narrow BOS, poor foot clearance- Right, and poor foot clearance- Left Distance walked: in clinic distances Assistive device utilized: Environmental consultant - 4 wheeled Level of assistance: Complete Independence   FUNCTIONAL  TESTS:  5 times sit to stand: 22.38s Timed up and go (TUG): 26.97s with rollator, 28.32s without AD  Berg Balance Scale: 43/56                                                                                                                             TREATMENT DATE:  08/15/23 NuStep L5 x28mins  Recheck goals  Tandem standing in bars SLS in bars Lateral taps on to BOSU 2x10  Leg ext 10# 2x10 HS curls 25# 2x10 STS on airex 2x10 Reaching while standing on airex- CGA loses balance backwards 2x  Calf raises 2x10 Toe raises 2x10  08/10/23 NuStep L5x41mins  Step up on airex CGA-minA Side steps on airex CGA-minA  On airex stair taps  Resisted gait 10# 3 way x5 forwards and side ways CGA Calf raises 2x10  STS with chest press x10, then OHP x10  Horizontal abd red band 3x10    08/08/23 NuStep L5x53mins Walking outdoors [address], up and down curbs w/o SPC  Leg ext 10# 3x10  HS curls 25# 3x10 3 way cone taps standing on airex- minA and using some occasional UE touches in bars Rockerboard BERG 49/56   08/04/23 NuStep L5x98mins  Walking without AD 2 big lap with weight, 2 laps without  Tandem stance in bars SLS in bars  Cone taps on airex  Step ups 6" 1HRA On airex ball toss- slight LOB onto  toes, 1 full LOB STS on airex with chest press red ball 2x8  08/01/23:  Neuromuscular re education:  Gait 500' without device, 2 x weaving to L noted, with answering questions, head turn TUG, 13.58 sec  5 x sit to stand, no UE support  Balance activities to address stability with L LE: Standing R foot on airex, L foot on floor, static and with head turns 15 sec each Tandem standing with R UE support, able to manage without UE support 15 sec Side stepping along length of mat 6 x Walking 20' intervals with horizontal head turns 4 x Standing with L foot on 4" step with horizontal head turns 30 sec     07/28/23 NuStep L5x70mins  Walking on beam needs bars Marching on airex min-modA   Walking without device 2 laps with 1 rest break  STS on airex 2x10 Calf raises 2x12 Side steps  Red TB horizontal abd for posture 2x10   07/21/23 NuStep L5x85mins  Obstacles course around cones, over half foam roll, and on airex without walker min-modA Side steps on beam  Step ups 6" 1 handrail use  Cone taps on airex needs UE help  STS with chest press holding red ball  Standing on airex rows and ext red band 2x10 On airex reaching for different targets   07/19/23  Nustep L5x8 minutes all four extremities for w/u and neural priming STS with 3# x10 no UEs  Weighted rollator pushes for endurance/strength: 228ft x2 with 22#, then 210ft with 36# Forward step ups 4 inch step x12 B  Gait no device close S x265ft  Multiple rounds of gait without walker between chair and // bars   Tandem stance blue foam pad 2x30 seconds B  Tandem gait in // bars solid surface forward and backwards x4 rounds   07/13/23 NuStep L5x29mins Marching with rollator 2# 2x10 Hip abd with rollator 2# 2x10  LAQ 2# 2x10 HS curls red 2x10 STS 2x10 elevated mat table  Box taps 4"  Step ups 4" Side steps in bars  Side steps over obstacles in bars Horizontal abduction 2x10 for postural strengthening    07/12/23 EVAL    PATIENT EDUCATION: Education details: POC, HEP Person educated: Patient and Child(ren) Education method: Medical illustrator Education comprehension: verbalized understanding and returned demonstration  HOME EXERCISE PROGRAM: Access Code: [email or web address] URL: [email or web address] Date: 07/12/2023 Prepared by: Donavon Fudge  Exercises - Standing Hip Abduction with Counter Support  - 1 x daily - 7 x weekly - 2 sets - 10 reps - Mini Squat with Counter Support  - 1 x daily - 7 x weekly - 2 sets - 10 reps - Standing March with Counter Support  - 1 x daily - 7 x weekly - 2 sets - 10 reps - Standing Tandem Balance with Counter Support  - 1 x daily - 7 x weekly - 3 reps - 30  hold - Standing Single Leg Stance with Counter Support  - 1 x daily - 7 x weekly - 5 reps - 10 hold  GOALS: Goals reviewed with patient? Yes  SHORT TERM GOALS: Target date: 08/23/23  Patient will be independent with initial HEP. Baseline: 07/12/23- given Goal status: MET 07/28/23  2.  Patient will demonstrate decreased fall risk by scoring < 15 sec on TUG with AD. Baseline: 26.97s Goal status: MET 19s 07/28/23, MET    LONG TERM GOALS: Target date: 10/04/23  Patient will be independent with advanced/ongoing HEP to improve outcomes and carryover.  Baseline:  Goal status: INITIAL  2.  Patient will be able to ambulate 300' without AD and good safety to access community.  Baseline: using rollator at eval Goal status: MET 08/04/23   3.  Patient will demonstrate improved functional LE strength as demonstrated by 5xSTS w/o UE push off <15s. Baseline: 22.38s Goal status:08/01/23:  MET so far  12.74 sec   4.  Patient will score 51 on Berg Balance test to demonstrate lower risk of falls. (MCID= 8 points) Baseline: 43 Goal status: IN PROGRESS 49- 08/08/23, 50- 08/15/23  5.  Patient will demonstrate tandem stance >30s and SLS > 10s independently  Baseline:  Goal status: IN PROGRESS 08/04/23, 08/15/23 still needs UE help   6.  Patient will demonstrate decreased fall risk by scoring < 15 sec on TUG without AD. Baseline: 28.32s Goal status: 08/01/23: MET so far 13.58 sec  ASSESSMENT:  CLINICAL IMPRESSION: Patient continues to show good progress, she is walking with a SPC but not really using it, more so just holds it in her hand. She as met almost all of her goals except for tandem and SLS. At times she still veers to the L. Balance is still the main concern, will continue challenge her in this area. Advised to keep using the cane for outdoor ambulation and in unfamiliar environments.   EVAL: Patient is a 83 y.o. female who was seen today for physical therapy evaluation and treatment for CVA. She  is currently ambulating with a rollator but was fully independently prior to the stroke on 07/04/23. With assessments completed today, she does present as a fall risk. She was able to ambulate a few feet without the rollator very carefully and slowly. She would like to be able to return to do her daily 30 min walks. Patient will benefit from skilled PT to address her balance, strength, and gait abnormalities to improve her confidence, increase her functional independence and allow her to return to PLOF.   OBJECTIVE IMPAIRMENTS: Abnormal gait, decreased activity tolerance, decreased balance, decreased endurance, difficulty walking, decreased strength, improper body mechanics, postural dysfunction, and pain.   ACTIVITY LIMITATIONS: squatting, stairs, transfers, and locomotion level  PARTICIPATION LIMITATIONS: cleaning, laundry, and yard work  [facility] POTENTIAL: Good  CLINICAL DECISION MAKING: Stable/uncomplicated  EVALUATION COMPLEXITY: Low  PLAN:  PT FREQUENCY: 2x/week  PT DURATION: 12 weeks  PLANNED INTERVENTIONS: 97110-Therapeutic exercises, 97530- Therapeutic activity, [id number]- Neuromuscular re-education, 97535- Self Care, [id number]- Manual therapy, [phone]- Gait training, Patient/Family education, Balance training, Stair training, Cryotherapy, and Moist heat  PLAN FOR NEXT SESSION: functional strengthening and balance training, try to do some walking without rollator, challenge endurance   Donavon Fudge PT, DPT 08/15/23 8:40 AM

## 2023-08-15 ENCOUNTER — Ambulatory Visit: Attending: Internal Medicine

## 2023-08-15 ENCOUNTER — Ambulatory Visit: Admitting: Occupational Therapy

## 2023-08-15 DIAGNOSIS — R2681 Unsteadiness on feet: Secondary | ICD-10-CM | POA: Insufficient documentation

## 2023-08-15 DIAGNOSIS — I639 Cerebral infarction, unspecified: Secondary | ICD-10-CM | POA: Diagnosis present

## 2023-08-15 DIAGNOSIS — M6281 Muscle weakness (generalized): Secondary | ICD-10-CM | POA: Diagnosis present

## 2023-08-15 DIAGNOSIS — R2689 Other abnormalities of gait and mobility: Secondary | ICD-10-CM | POA: Diagnosis present

## 2023-08-15 DIAGNOSIS — R278 Other lack of coordination: Secondary | ICD-10-CM | POA: Diagnosis present

## 2023-08-16 NOTE — Therapy (Signed)
 OUTPATIENT PHYSICAL THERAPY NEURO TREATMENT     Patient Name: Aimee [facility] MRN: [id number] DOB:[birth year], 83 y.o., female Today's Date: 08/17/2023   PCP: [facility] REFERRING PROVIDER: Lasandra [facility]   END OF SESSION:  PT End of Session - 08/17/23 0754     Visit Number 11    Date for PT Re-Evaluation 10/04/23    Progress Note Due on Visit 10    PT Start Time 0758    PT Stop Time 0845    PT Time Calculation (min) 47 min    Activity Tolerance Patient tolerated treatment well    Behavior During Therapy [facility] for tasks assessed/performed                       Past Medical History:  Diagnosis Date   Allergy    Arthritis    "all over" (07/10/2013)   CAD (coronary artery disease)    a. normal cors by cath in 2016  b. 08/2016: admitted with an NSTEMI. Cath showed mild nonobstructive CAD with only 20% mid-LAD stenosis. EF was reduced to 35% and wall motion abnormalities were suggestive of a stress-induced cardiomyopathy.   Carpal tunnel syndrome of left wrist    Cerebral aneurysm, nonruptured    3-4 mm in size, stable since 2004 (most recent MRI 2014)   Colon polyp 2007   Diverticulitis    Family history of anesthesia complication    "brother had PONV"   Gastroesophageal reflux disease    High cholesterol    Hypertension    Insomnia    Macular degeneration 08/31/2016   PONV (postoperative nausea and vomiting)    Restless leg syndrome    Stroke [facility]) 02/2013   denies residual on 07/10/2013)   Past Surgical History:  Procedure Laterality Date   BREAST SURGERY     CHOLECYSTECTOMY     JOINT REPLACEMENT     KNEE ARTHROSCOPY Bilateral    LEFT HEART CATH AND CORONARY ANGIOGRAPHY N/A 09/01/2016   Procedure: Left Heart Cath and Coronary Angiography;  Surgeon: Aimee Hamilton, MD;  Location: [facility];  Service: Cardiovascular;  Laterality: N/A;   LEFT HEART CATHETERIZATION WITH CORONARY ANGIOGRAM N/A 06/20/2014   Procedure: LEFT HEART CATHETERIZATION  WITH CORONARY ANGIOGRAM;  Surgeon: Aimee Madura, MD;  [facility];  Service: Cardiovascular;  Laterality: N/A;   TEE WITHOUT CARDIOVERSION N/A 03/14/2013   Procedure: TRANSESOPHAGEAL ECHOCARDIOGRAM (TEE);  Surgeon: Aimee Gathers, MD;  Location: [facility];  Service: Cardiovascular;  Laterality: N/A;   TOTAL KNEE ARTHROPLASTY Left 07/10/2013   TOTAL KNEE ARTHROPLASTY Left 07/10/2013   Procedure: TOTAL KNEE ARTHROPLASTY;  Surgeon: Aimee Dotter, MD;  Location: [facility];  Service: Orthopedics;  Laterality: Left;   VAGINAL HYSTERECTOMY  2000's   Patient Active Problem List   Diagnosis Date Noted   CVA (cerebral vascular accident) (HCC) 07/05/2023   Left-sided weakness 07/04/2023   HLD (hyperlipidemia) 07/04/2023   GAD (generalized anxiety disorder) 07/04/2023   Impingement syndrome of right shoulder 10/28/2021   Carpal tunnel syndrome, bilateral 12/29/2018   Pain in right knee 12/05/2018   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   NSTEMI (non-ST elevated myocardial infarction) (HCC)    Chest pain with moderate risk of acute coronary syndrome 08/31/2016   Normal coronary arteries-2016 08/31/2016   Chest pain on exertion 06/11/2014   Osteoarthritis of left knee 07/12/2013   S/P total knee replacement using cement 07/10/2013   Aneurysm, cerebral, nonruptured 04/23/2013   History of LMCA CVA-TPA  2014 03/09/2013   Osteoarthritis 11/14/2012   Restless legs syndrome 11/14/2012   GERD (gastroesophageal reflux disease) 11/13/2012   Essential hypertension 11/05/2012   Other and unspecified hyperlipidemia 11/05/2012    ONSET DATE: 07/04/23  REFERRING DIAG:  I63.9 (ICD-10-CM) - Cerebrovascular accident (CVA), unspecified mechanism (HCC)      THERAPY DIAG:  Other lack of coordination  Muscle weakness (generalized)  Unsteadiness on feet  Other abnormalities of gait and mobility  Rationale for Evaluation and Treatment: Rehabilitation  SUBJECTIVE:                                                                                                                                                                                              SUBJECTIVE STATEMENT: pretty good.   Pt accompanied by:  son  PERTINENT HISTORY: 07/04/23--Aimee Hood is a 83 y.o. person living with a history of hypertension, hyperlipidemia, GAD, GERD, obesity, vitamin B12 deficiency, vitamin D  deficiency, recurrent epistaxis, remote left MCA distribution cortical stroke in 2014 who presented with new left sided weakness and admitted for Stroke workup on [facility] day 0. MRIBrain:Positive for a small 11 mm Acute Infarct in the posterior right corona radiata tracking toward the insula   Hx of LMCA in 2014 L knee TKA 2015   PAIN:  Are you having pain? No 0/10  PRECAUTIONS: Fall  RED FLAGS: None   WEIGHT BEARING RESTRICTIONS: No  FALLS: Has patient fallen in last 6 months? No  LIVING ENVIRONMENT: Lives with: lives with their son Lives in: House/apartment Stairs: Yes: External: 2 steps; has a ramp that she uses  Has following equipment at home: Single point cane, Walker - 4 wheeled, and Wheelchair (manual)  PLOF: Independent  PATIENT GOALS: just be normal, patient's son states that she gets tired after putting on clothes so it would be a good goal to work on her endurance  OBJECTIVE:  Note: Objective measures were completed at Evaluation unless otherwise noted.  DIAGNOSTIC FINDINGS: CT head code stroke without contrast IMPRESSION: 1. No evidence of an acute intracranial abnormality. 2. Parenchymal atrophy, chronic small vessel ischemic disease and chronic infarcts, as described. 3. Mild generalized cerebral atrophy. 4. Paranasal sinus disease as outlined. Correlate for signs/symptoms of acute sinusitis. 5. Nasal septal defect.   CT Angio Head and neck with and without contrast IMPRESSION: CTA neck:   1. The common carotid and internal carotid arteries are patent within the neck  without stenosis or significant atherosclerotic disease. 2. Venous reflux of contrast partially obscures the left vertebral artery V1 segment. Within this limitation, the vertebral arteries are patent within the neck without stenosis or  significant atherosclerotic disease. 3. Aortic Atherosclerosis (ICD10-I70.0).   CTA head:   1. No proximal intracranial large vessel occlusion identified. 2. Intracranial atherosclerotic disease with multifocal stenoses, most notably as follows. 3. Severe stenosis within the right PCA P2 segment. 4. Severe stenosis within the right PCA P3 segment. 5. Moderate stenosis within the left PCA at the P1/P2 junction. 6. 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     MR Brain IMPRESSION: 1. Positive for a small 11 mm Acute Infarct in the posterior right corona radiata tracking toward the insula. No associated hemorrhage or mass effect. 2. Progressed chronic ischemic disease since a 2020 MRI, which is otherwise most pronounced in the posterior left MCA territory.  COGNITION: Overall cognitive status: Within functional limits for tasks assessed   SENSATION: WFL   POSTURE: rounded shoulders, forward head, and increased thoracic kyphosis  LOWER EXTREMITY ROM:   all WNL   LOWER EXTREMITY MMT:  grossly 4/5 BLE    TRANSFERS: Assistive device utilized: Environmental consultant - 4 wheeled  Sit to stand: Modified independence Stand to sit: Modified independence  GAIT: Gait pattern: step to pattern, decreased stride length, ataxic, narrow BOS, poor foot clearance- Right, and poor foot clearance- Left Distance walked: in clinic distances Assistive device utilized: Environmental consultant - 4 wheeled Level of assistance: Complete Independence   FUNCTIONAL TESTS:  5 times sit to stand: 22.38s Timed up and go (TUG): 26.97s with rollator, 28.32s without AD  Berg Balance Scale: 43/56                                                                                                                              TREATMENT DATE:  08/17/23 NuStep L5x10mins  Step ups 6"  Side steps on airex  On airex tapping spike balls  Walking on beam  STS with chest press red ball 2x10 Leg ext 10# 2x10 HS curls 25# 2x10 Shoulder ext 5# 2x10 Calf stretch 15s x2   08/15/23 NuStep L5 x77mins  Recheck goals  Tandem standing in bars SLS in bars Lateral taps on to BOSU 2x10  Leg ext 10# 2x10 HS curls 25# 2x10 STS on airex 2x10 Reaching while standing on airex- CGA loses balance backwards 2x  Calf raises 2x10 Toe raises 2x10  08/10/23 NuStep L5x24mins  Step up on airex CGA-minA Side steps on airex CGA-minA  On airex stair taps  Resisted gait 10# 3 way x5 forwards and side ways CGA Calf raises 2x10  STS with chest press x10, then OHP x10  Horizontal abd red band 3x10    08/08/23 NuStep L5x76mins Walking outdoors [address], up and down curbs w/o SPC  Leg ext 10# 3x10  HS curls 25# 3x10 3 way cone taps standing on airex- minA and using some occasional UE touches in bars Rockerboard BERG 49/56   08/04/23 NuStep L5x32mins  Walking without AD 2 big lap with weight, 2 laps without  Tandem  stance in bars SLS in bars  Cone taps on airex  Step ups 6" 1HRA On airex ball toss- slight LOB onto toes, 1 full LOB STS on airex with chest press red ball 2x8  08/01/23:  Neuromuscular re education:  Gait 500' without device, 2 x weaving to L noted, with answering questions, head turn TUG, 13.58 sec  5 x sit to stand, no UE support  Balance activities to address stability with L LE: Standing R foot on airex, L foot on floor, static and with head turns 15 sec each Tandem standing with R UE support, able to manage without UE support 15 sec Side stepping along length of mat 6 x Walking 20' intervals with horizontal head turns 4 x Standing with L foot on 4" step with horizontal head turns 30 sec     07/28/23 NuStep L5x45mins  Walking on beam needs  bars Marching on airex min-modA  Walking without device 2 laps with 1 rest break  STS on airex 2x10 Calf raises 2x12 Side steps  Red TB horizontal abd for posture 2x10   07/21/23 NuStep L5x55mins  Obstacles course around cones, over half foam roll, and on airex without walker min-modA Side steps on beam  Step ups 6" 1 handrail use  Cone taps on airex needs UE help  STS with chest press holding red ball  Standing on airex rows and ext red band 2x10 On airex reaching for different targets   07/19/23  Nustep L5x8 minutes all four extremities for w/u and neural priming STS with 3# x10 no UEs  Weighted rollator pushes for endurance/strength: 261ft x2 with 22#, then 240ft with 36# Forward step ups 4 inch step x12 B  Gait no device close S x219ft  Multiple rounds of gait without walker between chair and // bars   Tandem stance blue foam pad 2x30 seconds B  Tandem gait in // bars solid surface forward and backwards x4 rounds   07/13/23 NuStep L5x28mins Marching with rollator 2# 2x10 Hip abd with rollator 2# 2x10  LAQ 2# 2x10 HS curls red 2x10 STS 2x10 elevated mat table  Box taps 4"  Step ups 4" Side steps in bars  Side steps over obstacles in bars Horizontal abduction 2x10 for postural strengthening    07/12/23 EVAL    PATIENT EDUCATION: Education details: POC, HEP Person educated: Patient and Child(ren) Education method: Medical illustrator Education comprehension: verbalized understanding and returned demonstration  HOME EXERCISE PROGRAM: Access Code: [email or web address] URL: [email or web address] Date: 07/12/2023 Prepared by: Donavon Fudge  Exercises - Standing Hip Abduction with Counter Support  - 1 x daily - 7 x weekly - 2 sets - 10 reps - Mini Squat with Counter Support  - 1 x daily - 7 x weekly - 2 sets - 10 reps - Standing March with Counter Support  - 1 x daily - 7 x weekly - 2 sets - 10 reps - Standing Tandem Balance with Counter Support  - 1 x  daily - 7 x weekly - 3 reps - 30 hold - Standing Single Leg Stance with Counter Support  - 1 x daily - 7 x weekly - 5 reps - 10 hold  GOALS: Goals reviewed with patient? Yes  SHORT TERM GOALS: Target date: 08/23/23  Patient will be independent with initial HEP. Baseline: 07/12/23- given Goal status: MET 07/28/23  2.  Patient will demonstrate decreased fall risk by scoring < 15 sec on TUG with AD. Baseline: 26.97s Goal status: MET 19s 07/28/23,  MET    LONG TERM GOALS: Target date: 10/04/23  Patient will be independent with [facility]/ongoing HEP to improve outcomes and carryover.  Baseline:  Goal status: INITIAL  2.  Patient will be able to ambulate 300' without AD and good safety to access community.  Baseline: using rollator at eval Goal status: MET 08/04/23   3.  Patient will demonstrate improved functional LE strength as demonstrated by 5xSTS w/o UE push off <15s. Baseline: 22.38s Goal status:08/01/23:  MET so far  12.74 sec   4.  Patient will score 51 on Berg Balance test to demonstrate lower risk of falls. (MCID= 8 [facility]) Baseline: 43 Goal status: IN PROGRESS 49- 08/08/23, 50- 08/15/23  5.  Patient will demonstrate tandem stance >30s and SLS > 10s independently  Baseline:  Goal status: IN PROGRESS 08/04/23, 08/15/23 still needs UE help   6.  Patient will demonstrate decreased fall risk by scoring < 15 sec on TUG without AD. Baseline: 28.32s Goal status: 08/01/23: MET so far 13.58 sec  ASSESSMENT:  CLINICAL IMPRESSION: Patient continues to show good progress, she is walking with a SPC but not really using it.. She as met almost all of her goals except for tandem and SLS. At times she still veers to the L, but feels that she is ready for discharge and at her PLOF. With balance tasks, she needs some UE help.   EVAL: Patient is a 83 y.o. female who was seen today for physical therapy evaluation and treatment for CVA. She is currently ambulating with a rollator but was fully  independently prior to the stroke on 07/04/23. With assessments completed today, she does present as a fall risk. She was able to ambulate a few feet without the rollator very carefully and slowly. She would like to be able to return to do her daily 30 min walks. Patient will benefit from skilled PT to address her balance, strength, and gait abnormalities to improve her confidence, increase her functional independence and allow her to return to PLOF.   OBJECTIVE IMPAIRMENTS: Abnormal gait, decreased activity tolerance, decreased balance, decreased endurance, difficulty walking, decreased strength, improper body mechanics, postural dysfunction, and pain.   ACTIVITY LIMITATIONS: squatting, stairs, transfers, and locomotion level  PARTICIPATION LIMITATIONS: cleaning, laundry, and yard work  [facility] POTENTIAL: Good  CLINICAL DECISION MAKING: Stable/uncomplicated  EVALUATION COMPLEXITY: Low  PLAN:  PT FREQUENCY: 2x/week  PT DURATION: 12 weeks  PLANNED INTERVENTIONS: 97110-Therapeutic exercises, 97530- Therapeutic activity, [id number]- Neuromuscular re-education, 97535- Self Care, [id number]- Manual therapy, [phone]- Gait training, Patient/Family education, Balance training, Stair training, Cryotherapy, and Moist heat  PLAN FOR NEXT SESSION: functional strengthening and balance training, try to do some walking without rollator, challenge endurance   Donavon Fudge PT, DPT 08/17/23 8:43 AM

## 2023-08-17 ENCOUNTER — Ambulatory Visit

## 2023-08-17 ENCOUNTER — Ambulatory Visit: Admitting: Occupational Therapy

## 2023-08-17 DIAGNOSIS — I639 Cerebral infarction, unspecified: Secondary | ICD-10-CM

## 2023-08-17 DIAGNOSIS — R278 Other lack of coordination: Secondary | ICD-10-CM | POA: Diagnosis not present

## 2023-08-17 DIAGNOSIS — M6281 Muscle weakness (generalized): Secondary | ICD-10-CM

## 2023-08-17 DIAGNOSIS — R2681 Unsteadiness on feet: Secondary | ICD-10-CM

## 2023-08-17 DIAGNOSIS — R2689 Other abnormalities of gait and mobility: Secondary | ICD-10-CM

## 2023-08-22 ENCOUNTER — Encounter: Payer: Self-pay | Admitting: Physical Therapy

## 2023-08-22 ENCOUNTER — Ambulatory Visit: Admitting: Occupational Therapy

## 2023-08-22 ENCOUNTER — Ambulatory Visit: Admitting: Physical Therapy

## 2023-08-22 DIAGNOSIS — R2681 Unsteadiness on feet: Secondary | ICD-10-CM

## 2023-08-22 DIAGNOSIS — R278 Other lack of coordination: Secondary | ICD-10-CM | POA: Diagnosis not present

## 2023-08-22 DIAGNOSIS — M6281 Muscle weakness (generalized): Secondary | ICD-10-CM

## 2023-08-22 DIAGNOSIS — I639 Cerebral infarction, unspecified: Secondary | ICD-10-CM

## 2023-08-22 DIAGNOSIS — R2689 Other abnormalities of gait and mobility: Secondary | ICD-10-CM

## 2023-08-22 NOTE — Therapy (Signed)
 OUTPATIENT PHYSICAL THERAPY NEURO TREATMENT     Patient Name: Aimee Hood MRN: [id number] DOB:December 01, 1940, 83 y.o., female Today's Date: 08/22/2023   PCP: Myrtha Ates REFERRING PROVIDER: Lasandra Points   END OF SESSION:  PT End of Session - 08/22/23 0754     Visit Number 12    Date for PT Re-Evaluation 10/04/23    Authorization Type HTA    PT Start Time 0755    PT Stop Time 0840    PT Time Calculation (min) 45 min    Activity Tolerance Patient tolerated treatment well    Behavior During Therapy [facility] for tasks assessed/performed                       Past Medical History:  Diagnosis Date   Allergy    Arthritis    "all over" (07/10/2013)   CAD (coronary artery disease)    a. normal cors by cath in 2016  b. 08/2016: admitted with an NSTEMI. Cath showed mild nonobstructive CAD with only 20% mid-LAD stenosis. EF was reduced to 35% and wall motion abnormalities were suggestive of a stress-induced cardiomyopathy.   Carpal tunnel syndrome of left wrist    Cerebral aneurysm, nonruptured    3-4 mm in size, stable since 2004 (most recent MRI 2014)   Colon polyp 2007   Diverticulitis    Family history of anesthesia complication    "brother had PONV"   Gastroesophageal reflux disease    High cholesterol    Hypertension    Insomnia    Macular degeneration 08/31/2016   PONV (postoperative nausea and vomiting)    Restless leg syndrome    Stroke [facility]) 02/2013   denies residual on 07/10/2013)   Past Surgical History:  Procedure Laterality Date   BREAST SURGERY     CHOLECYSTECTOMY     JOINT REPLACEMENT     KNEE ARTHROSCOPY Bilateral    LEFT HEART CATH AND CORONARY ANGIOGRAPHY N/A 09/01/2016   Procedure: Left Heart Cath and Coronary Angiography;  Surgeon: Wenona Hamilton, MD;  Location: [facility] INVASIVE CV LAB;  Service: Cardiovascular;  Laterality: N/A;   LEFT HEART CATHETERIZATION WITH CORONARY ANGIOGRAM N/A 06/20/2014   Procedure: LEFT HEART CATHETERIZATION WITH  CORONARY ANGIOGRAM;  Surgeon: Hugh Madura, MD;  Location: [facility] CATH LAB;  Service: Cardiovascular;  Laterality: N/A;   TEE WITHOUT CARDIOVERSION N/A 03/14/2013   Procedure: TRANSESOPHAGEAL ECHOCARDIOGRAM (TEE);  Surgeon: Dorothye Gathers, MD;  Location[facility] ENDOSCOPY;  Service: Cardiovascular;  Laterality: N/A;   TOTAL KNEE ARTHROPLASTY Left 07/10/2013   TOTAL KNEE ARTHROPLASTY Left 07/10/2013   Procedure: TOTAL KNEE ARTHROPLASTY;  Surgeon: Shirlee Dotter, MD;  Location: [facility] OR;  Service: Orthopedics;  Laterality: Left;   VAGINAL HYSTERECTOMY  2000's   Patient Active Problem List   Diagnosis Date Noted   CVA (cerebral vascular accident) (HCC) 07/05/2023   Left-sided weakness 07/04/2023   HLD (hyperlipidemia) 07/04/2023   GAD (generalized anxiety disorder) 07/04/2023   Impingement syndrome of right shoulder 10/28/2021   Carpal tunnel syndrome, bilateral 12/29/2018   Pain in right knee 12/05/2018   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   NSTEMI (non-ST elevated myocardial infarction) (HCC)    Chest pain with moderate risk of acute coronary syndrome 08/31/2016   Normal coronary arteries-2016 08/31/2016   Chest pain on exertion 06/11/2014   Osteoarthritis of left knee 07/12/2013   S/P total knee replacement using cement 07/10/2013   Aneurysm, cerebral, nonruptured 04/23/2013   History of LMCA CVA-TPA 2014 03/09/2013  Osteoarthritis 11/14/2012   Restless legs syndrome 11/14/2012   GERD (gastroesophageal reflux disease) 11/13/2012   Essential hypertension 11/05/2012   Other and unspecified hyperlipidemia 11/05/2012    ONSET DATE: 07/04/23  REFERRING DIAG:  I63.9 (ICD-10-CM) - Cerebrovascular accident (CVA), unspecified mechanism (HCC)      THERAPY DIAG:  Other lack of coordination  Muscle weakness (generalized)  Unsteadiness on feet  Other abnormalities of gait and mobility  Cerebrovascular accident (CVA), unspecified mechanism (HCC)  Rationale for Evaluation and Treatment:  Rehabilitation  SUBJECTIVE:                                                                                                                                                                                             SUBJECTIVE STATEMENT: Doing okay, using the SPC when I am out, nothing in the house   Pt accompanied by: son  PERTINENT HISTORY: 07/04/23--Aimee Hood is a 83 y.o. person living with a history of hypertension, hyperlipidemia, GAD, GERD, obesity, vitamin B12 deficiency, vitamin D  deficiency, recurrent epistaxis, remote left MCA distribution cortical stroke in 2014 who presented with new left sided weakness and admitted for Stroke workup on hospital day 0. MRIBrain:Positive for a small 11 mm Acute Infarct in the posterior right corona radiata tracking toward the insula   Hx of LMCA in 2014 L knee TKA 2015   PAIN:  Are you having pain? No 0/10  PRECAUTIONS: Fall  RED FLAGS: None   WEIGHT BEARING RESTRICTIONS: No  FALLS: Has patient fallen in last 6 months? No  LIVING ENVIRONMENT: Lives with: lives with their son Lives in: House/apartment Stairs: Yes: External: 2 steps; has a ramp that she uses  Has following equipment at home: Single point cane, Walker - 4 wheeled, and Wheelchair (manual)  PLOF: Independent  PATIENT GOALS: just be normal, patient's son states that she gets tired after putting on clothes so it would be a good goal to work on her endurance  OBJECTIVE:  Note: Objective measures were completed at Evaluation unless otherwise noted.  DIAGNOSTIC FINDINGS: CT head code stroke without contrast IMPRESSION: 1. No evidence of an acute intracranial abnormality. 2. Parenchymal atrophy, chronic small vessel ischemic disease and chronic infarcts, as described. 3. Mild generalized cerebral atrophy. 4. Paranasal sinus disease as outlined. Correlate for signs/symptoms of acute sinusitis. 5. Nasal septal defect.   CT Angio Head and neck with and without  contrast IMPRESSION: CTA neck:   1. The common carotid and internal carotid arteries are patent within the neck without stenosis or significant atherosclerotic disease. 2. Venous reflux of contrast partially obscures the left vertebral artery V1 segment. Within  this limitation, the vertebral arteries are patent within the neck without stenosis or significant atherosclerotic disease. 3. Aortic Atherosclerosis (ICD10-I70.0).   CTA head:   1. No proximal intracranial large vessel occlusion identified. 2. Intracranial atherosclerotic disease with multifocal stenoses, most notably as follows. 3. Severe stenosis within the right PCA P2 segment. 4. Severe stenosis within the right PCA P3 segment. 5. Moderate stenosis within the left PCA at the P1/P2 junction. 6. 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     MR Brain IMPRESSION: 1. Positive for a small 11 mm Acute Infarct in the posterior right corona radiata tracking toward the insula. No associated hemorrhage or mass effect. 2. Progressed chronic ischemic disease since a 2020 MRI, which is otherwise most pronounced in the posterior left MCA territory.  COGNITION: Overall cognitive status: Within functional limits for tasks assessed   SENSATION: WFL   POSTURE: rounded shoulders, forward head, and increased thoracic kyphosis  LOWER EXTREMITY ROM:   all WNL   LOWER EXTREMITY MMT:  grossly 4/5 BLE    TRANSFERS: Assistive device utilized: Environmental consultant - 4 wheeled  Sit to stand: Modified independence Stand to sit: Modified independence  GAIT: Gait pattern: step to pattern, decreased stride length, ataxic, narrow BOS, poor foot clearance- Right, and poor foot clearance- Left Distance walked: in clinic distances Assistive device utilized: Environmental consultant - 4 wheeled Level of assistance: Complete Independence   FUNCTIONAL TESTS:  5 times sit to stand: 22.38s Timed up and go (TUG): 26.97s with  rollator, 28.32s without AD  Berg Balance Scale: 43/56                                                                                                                             TREATMENT DATE:  08/22/23 Nustep level 5 x 6 minutes HS curls 25# 2x10 Leg extension 10# 2x10 5# straight arm pulls on the airex Walking ball toss On airex ball toss On airex cone toe touch with CGA and then with SPC and SBA On airex reaching for numbers 3# marches, abduction and extension Side stepping Backward walking Direction changes Talked about safety and the need to continue to exercise  08/17/23 NuStep L5x33mins  Step ups 6"  Side steps on airex  On airex tapping spike balls  Walking on beam  STS with chest press red ball 2x10 Leg ext 10# 2x10 HS curls 25# 2x10 Shoulder ext 5# 2x10 Calf stretch 15s x2   08/15/23 NuStep L5 x98mins  Recheck goals  Tandem standing in bars SLS in bars Lateral taps on to BOSU 2x10  Leg ext 10# 2x10 HS curls 25# 2x10 STS on airex 2x10 Reaching while standing on airex- CGA loses balance backwards 2x  Calf raises 2x10 Toe raises 2x10  08/10/23 NuStep L5x74mins  Step up on airex CGA-minA Side steps on airex CGA-minA  On airex stair taps  Resisted gait 10# 3 way x5 forwards and side ways CGA  Calf raises 2x10  STS with chest press x10, then OHP x10  Horizontal abd red band 3x10    08/08/23 NuStep L5x25mins Walking outdoors [address], up and down curbs w/o SPC  Leg ext 10# 3x10  HS curls 25# 3x10 3 way cone taps standing on airex- minA and using some occasional UE touches in bars Rockerboard BERG 49/56   08/04/23 NuStep L5x30mins  Walking without AD 2 big lap with weight, 2 laps without  Tandem stance in bars SLS in bars  Cone taps on airex  Step ups 6" 1HRA On airex ball toss- slight LOB onto toes, 1 full LOB STS on airex with chest press red ball 2x8  08/01/23:  Neuromuscular re education:  Gait 500' without device, 2 x weaving to L noted, with  answering questions, head turn TUG, 13.58 sec  5 x sit to stand, no UE support  Balance activities to address stability with L LE: Standing R foot on airex, L foot on floor, static and with head turns 15 sec each Tandem standing with R UE support, able to manage without UE support 15 sec Side stepping along length of mat 6 x Walking 20' intervals with horizontal head turns 4 x Standing with L foot on 4" step with horizontal head turns 30 sec     07/28/23 NuStep L5x69mins  Walking on beam needs bars Marching on airex min-modA  Walking without device 2 laps with 1 rest break  STS on airex 2x10 Calf raises 2x12 Side steps  Red TB horizontal abd for posture 2x10   07/21/23 NuStep L5x3mins  Obstacles course around cones, over half foam roll, and on airex without walker min-modA Side steps on beam  Step ups 6" 1 handrail use  Cone taps on airex needs UE help  STS with chest press holding red ball  Standing on airex rows and ext red band 2x10 On airex reaching for different targets   07/19/23  Nustep L5x8 minutes all four extremities for w/u and neural priming STS with 3# x10 no UEs  Weighted rollator pushes for endurance/strength: 210ft x2 with 22#, then 254ft with 36# Forward step ups 4 inch step x12 B  Gait no device close S x291ft  Multiple rounds of gait without walker between chair and // bars   Tandem stance blue foam pad 2x30 seconds B  Tandem gait in // bars solid surface forward and backwards x4 rounds   07/13/23 NuStep L5x56mins Marching with rollator 2# 2x10 Hip abd with rollator 2# 2x10  LAQ 2# 2x10 HS curls red 2x10 STS 2x10 elevated mat table  Box taps 4"  Step ups 4" Side steps in bars  Side steps over obstacles in bars Horizontal abduction 2x10 for postural strengthening    07/12/23 EVAL    PATIENT EDUCATION: Education details: POC, HEP Person educated: Patient and Child(ren) Education method: Medical illustrator Education comprehension:  verbalized understanding and returned demonstration  HOME EXERCISE PROGRAM: Access Code: [email or web address] URL: [email or web address] Date: 07/12/2023 Prepared by: Donavon Fudge  Exercises - Standing Hip Abduction with Counter Support  - 1 x daily - 7 x weekly - 2 sets - 10 reps - Mini Squat with Counter Support  - 1 x daily - 7 x weekly - 2 sets - 10 reps - Standing March with Counter Support  - 1 x daily - 7 x weekly - 2 sets - 10 reps - Standing Tandem Balance with Counter Support  - 1 x daily - 7 x weekly -  3 reps - 30 hold - Standing Single Leg Stance with Counter Support  - 1 x daily - 7 x weekly - 5 reps - 10 hold  GOALS: Goals reviewed with patient? Yes  SHORT TERM GOALS: Target date: 08/23/23  Patient will be independent with initial HEP. Baseline: 07/12/23- given Goal status: MET 07/28/23  2.  Patient will demonstrate decreased fall risk by scoring < 15 sec on TUG with AD. Baseline: 26.97s Goal status: MET 19s 07/28/23, MET    LONG TERM GOALS: Target date: 10/04/23  Patient will be independent with advanced/ongoing HEP to improve outcomes and carryover.  Baseline:  Goal status: progressing 08/22/23  2.  Patient will be able to ambulate 300' without AD and good safety to access community.  Baseline: using rollator at eval Goal status: MET 08/04/23   3.  Patient will demonstrate improved functional LE strength as demonstrated by 5xSTS w/o UE push off <15s. Baseline: 22.38s Goal status:08/01/23:  MET so far  12.74 sec   4.  Patient will score 51 on Berg Balance test to demonstrate lower risk of falls. (MCID= 8 points) Baseline: 43 Goal status: IN PROGRESS 49- 08/08/23, 50- 08/15/23  5.  Patient will demonstrate tandem stance >30s and SLS > 10s independently  Baseline:  Goal status: IN PROGRESS 08/04/23, 08/15/23 still needs UE help   6.  Patient will demonstrate decreased fall risk by scoring < 15 sec on TUG without AD. Baseline: 28.32s Goal status: 08/01/23: MET so far  13.58 sec  ASSESSMENT:  CLINICAL IMPRESSION: Patient continues to show good progress, she is walking with a SPC but not really using it.. She has the biggest issue with tandem stance and with balance on dynamic surfaces, did well with direction changes and walking ball toss.  Talked with her about the need to stay active and walking the dog and getting mail  EVAL: Patient is a 83 y.o. female who was seen today for physical therapy evaluation and treatment for CVA. She is currently ambulating with a rollator but was fully independently prior to the stroke on 07/04/23. With assessments completed today, she does present as a fall risk. She was able to ambulate a few feet without the rollator very carefully and slowly. She would like to be able to return to do her daily 30 min walks. Patient will benefit from skilled PT to address her balance, strength, and gait abnormalities to improve her confidence, increase her functional independence and allow her to return to PLOF.   OBJECTIVE IMPAIRMENTS: Abnormal gait, decreased activity tolerance, decreased balance, decreased endurance, difficulty walking, decreased strength, improper body mechanics, postural dysfunction, and pain.   ACTIVITY LIMITATIONS: squatting, stairs, transfers, and locomotion level  PARTICIPATION LIMITATIONS: cleaning, laundry, and yard work  [facility] POTENTIAL: Good  CLINICAL DECISION MAKING: Stable/uncomplicated  EVALUATION COMPLEXITY: Low  PLAN:  PT FREQUENCY: 2x/week  PT DURATION: 12 weeks  PLANNED INTERVENTIONS: 97110-Therapeutic exercises, 97530- Therapeutic activity, [id number]- Neuromuscular re-education, 97535- Self Care, [id number]- Manual therapy, [phone]- Gait training, Patient/Family education, Balance training, Stair training, Cryotherapy, and Moist heat  PLAN FOR NEXT SESSION: possible D/C, review HEP and walking program and safety  Cherylene Corrente, PT 08/22/23 7:55 AM

## 2023-08-24 ENCOUNTER — Ambulatory Visit

## 2023-08-24 ENCOUNTER — Ambulatory Visit: Admitting: Occupational Therapy

## 2023-08-24 DIAGNOSIS — R278 Other lack of coordination: Secondary | ICD-10-CM

## 2023-08-24 DIAGNOSIS — M6281 Muscle weakness (generalized): Secondary | ICD-10-CM

## 2023-08-24 NOTE — Therapy (Signed)
 OUTPATIENT PHYSICAL THERAPY NEURO TREATMENT     Patient Name: CATHELEEN SPRINKLES MRN: [id number] DOB:10-20-1940, 83 y.o., female Today's Date: 08/24/2023   PCP: Myrtha Ates REFERRING PROVIDER: Lasandra Points   END OF SESSION:  PT End of Session - 08/24/23 0759     Visit Number 13    Date for PT Re-Evaluation 10/04/23    Authorization Type HTA    PT Start Time 0800    PT Stop Time 0845    PT Time Calculation (min) 45 min    Activity Tolerance Patient tolerated treatment well    Behavior During Therapy [facility] for tasks assessed/performed                        Past Medical History:  Diagnosis Date   Allergy    Arthritis    "all over" (07/10/2013)   CAD (coronary artery disease)    a. normal cors by cath in 2016  b. 08/2016: admitted with an NSTEMI. Cath showed mild nonobstructive CAD with only 20% mid-LAD stenosis. EF was reduced to 35% and wall motion abnormalities were suggestive of a stress-induced cardiomyopathy.   Carpal tunnel syndrome of left wrist    Cerebral aneurysm, nonruptured    3-4 mm in size, stable since 2004 (most recent MRI 2014)   Colon polyp 2007   Diverticulitis    Family history of anesthesia complication    "brother had PONV"   Gastroesophageal reflux disease    High cholesterol    Hypertension    Insomnia    Macular degeneration 08/31/2016   PONV (postoperative nausea and vomiting)    Restless leg syndrome    Stroke [facility]) 02/2013   denies residual on 07/10/2013)   Past Surgical History:  Procedure Laterality Date   BREAST SURGERY     CHOLECYSTECTOMY     JOINT REPLACEMENT     KNEE ARTHROSCOPY Bilateral    LEFT HEART CATH AND CORONARY ANGIOGRAPHY N/A 09/01/2016   Procedure: Left Heart Cath and Coronary Angiography;  Surgeon: Wenona Hamilton, MD;  Location: [facility] INVASIVE CV LAB;  Service: Cardiovascular;  Laterality: N/A;   LEFT HEART CATHETERIZATION WITH CORONARY ANGIOGRAM N/A 06/20/2014   Procedure: LEFT HEART CATHETERIZATION WITH  CORONARY ANGIOGRAM;  Surgeon: Hugh Madura, MD;  Location: [facility] CATH LAB;  Service: Cardiovascular;  Laterality: N/A;   TEE WITHOUT CARDIOVERSION N/A 03/14/2013   Procedure: TRANSESOPHAGEAL ECHOCARDIOGRAM (TEE);  Surgeon: Dorothye Gathers, MD;  Location: [facility] ENDOSCOPY;  Service: Cardiovascular;  Laterality: N/A;   TOTAL KNEE ARTHROPLASTY Left 07/10/2013   TOTAL KNEE ARTHROPLASTY Left 07/10/2013   Procedure: TOTAL KNEE ARTHROPLASTY;  Surgeon: Shirlee Dotter, MD;  Location: [facility] OR;  Service: Orthopedics;  Laterality: Left;   VAGINAL HYSTERECTOMY  2000's   Patient Active Problem List   Diagnosis Date Noted   CVA (cerebral vascular accident) (HCC) 07/05/2023   Left-sided weakness 07/04/2023   HLD (hyperlipidemia) 07/04/2023   GAD (generalized anxiety disorder) 07/04/2023   Impingement syndrome of right shoulder 10/28/2021   Carpal tunnel syndrome, bilateral 12/29/2018   Pain in right knee 12/05/2018   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   NSTEMI (non-ST elevated myocardial infarction) (HCC)    Chest pain with moderate risk of acute coronary syndrome 08/31/2016   Normal coronary arteries-2016 08/31/2016   Chest pain on exertion 06/11/2014   Osteoarthritis of left knee 07/12/2013   S/P total knee replacement using cement 07/10/2013   Aneurysm, cerebral, nonruptured 04/23/2013   History of LMCA CVA-TPA 2014 03/09/2013  Osteoarthritis 11/14/2012   Restless legs syndrome 11/14/2012   GERD (gastroesophageal reflux disease) 11/13/2012   Essential hypertension 11/05/2012   Other and unspecified hyperlipidemia 11/05/2012    ONSET DATE: 07/04/23  REFERRING DIAG:  I63.9 (ICD-10-CM) - Cerebrovascular accident (CVA), unspecified mechanism (HCC)      THERAPY DIAG:  Other lack of coordination  Muscle weakness (generalized)  Rationale for Evaluation and Treatment: Rehabilitation  SUBJECTIVE:                                                                                                                                                                                              SUBJECTIVE STATEMENT: I feel good.   Pt accompanied by: son  PERTINENT HISTORY: 07/04/23--Franziska MACKINZIE COLDWELL is a 83 y.o. person living with a history of hypertension, hyperlipidemia, GAD, GERD, obesity, vitamin B12 deficiency, vitamin D  deficiency, recurrent epistaxis, remote left MCA distribution cortical stroke in 2014 who presented with new left sided weakness and admitted for Stroke workup on hospital day 0. MRIBrain:Positive for a small 11 mm Acute Infarct in the posterior right corona radiata tracking toward the insula   Hx of LMCA in 2014 L knee TKA 2015   PAIN:  Are you having pain? No 0/10  PRECAUTIONS: Fall  RED FLAGS: None   WEIGHT BEARING RESTRICTIONS: No  FALLS: Has patient fallen in last 6 months? No  LIVING ENVIRONMENT: Lives with: lives with their son Lives in: House/apartment Stairs: Yes: External: 2 steps; has a ramp that she uses  Has following equipment at home: Single point cane, Walker - 4 wheeled, and Wheelchair (manual)  PLOF: Independent  PATIENT GOALS: just be normal, patient's son states that she gets tired after putting on clothes so it would be a good goal to work on her endurance  OBJECTIVE:  Note: Objective measures were completed at Evaluation unless otherwise noted.  DIAGNOSTIC FINDINGS: CT head code stroke without contrast IMPRESSION: 1. No evidence of an acute intracranial abnormality. 2. Parenchymal atrophy, chronic small vessel ischemic disease and chronic infarcts, as described. 3. Mild generalized cerebral atrophy. 4. Paranasal sinus disease as outlined. Correlate for signs/symptoms of acute sinusitis. 5. Nasal septal defect.   CT Angio Head and neck with and without contrast IMPRESSION: CTA neck:   1. The common carotid and internal carotid arteries are patent within the neck without stenosis or significant atherosclerotic disease. 2. Venous reflux of  contrast partially obscures the left vertebral artery V1 segment. Within this limitation, the vertebral arteries are patent within the neck without stenosis or significant atherosclerotic disease. 3. Aortic Atherosclerosis (ICD10-I70.0).   CTA head:   1. No  proximal intracranial large vessel occlusion identified. 2. Intracranial atherosclerotic disease with multifocal stenoses, most notably as follows. 3. Severe stenosis within the right PCA P2 segment. 4. Severe stenosis within the right PCA P3 segment. 5. Moderate stenosis within the left PCA at the P1/P2 junction. 6. 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     MR Brain IMPRESSION: 1. Positive for a small 11 mm Acute Infarct in the posterior right corona radiata tracking toward the insula. No associated hemorrhage or mass effect. 2. Progressed chronic ischemic disease since a 2020 MRI, which is otherwise most pronounced in the posterior left MCA territory.  COGNITION: Overall cognitive status: Within functional limits for tasks assessed   SENSATION: WFL   POSTURE: rounded shoulders, forward head, and increased thoracic kyphosis  LOWER EXTREMITY ROM:   all WNL   LOWER EXTREMITY MMT:  grossly 4/5 BLE    TRANSFERS: Assistive device utilized: Environmental consultant - 4 wheeled  Sit to stand: Modified independence Stand to sit: Modified independence  GAIT: Gait pattern: step to pattern, decreased stride length, ataxic, narrow BOS, poor foot clearance- Right, and poor foot clearance- Left Distance walked: in clinic distances Assistive device utilized: Environmental consultant - 4 wheeled Level of assistance: Complete Independence   FUNCTIONAL TESTS:  5 times sit to stand: 22.38s Timed up and go (TUG): 26.97s with rollator, 28.32s without AD  Berg Balance Scale: 43/56                                                                                                                             TREATMENT DATE:   08/24/23 Walk outdoors NuStep L5x74mins Walking on beam Cone taps standing on airex Tandem stance SLS Leg ext 10# 2x12 HS curls 25# 2x12  08/22/23 Nustep level 5 x 6 minutes HS curls 25# 2x10 Leg extension 10# 2x10 5# straight arm pulls on the airex Walking ball toss On airex ball toss On airex cone toe touch with CGA and then with SPC and SBA On airex reaching for numbers 3# marches, abduction and extension Side stepping Backward walking Direction changes Talked about safety and the need to continue to exercise  08/17/23 NuStep L5x34mins  Step ups 6"  Side steps on airex  On airex tapping spike balls  Walking on beam  STS with chest press red ball 2x10 Leg ext 10# 2x10 HS curls 25# 2x10 Shoulder ext 5# 2x10 Calf stretch 15s x2   08/15/23 NuStep L5 x93mins  Recheck goals  Tandem standing in bars SLS in bars Lateral taps on to BOSU 2x10  Leg ext 10# 2x10 HS curls 25# 2x10 STS on airex 2x10 Reaching while standing on airex- CGA loses balance backwards 2x  Calf raises 2x10 Toe raises 2x10  08/10/23 NuStep L5x65mins  Step up on airex CGA-minA Side steps on airex CGA-minA  On airex stair taps  Resisted gait 10# 3 way x5 forwards and side ways CGA Calf raises 2x10  STS with chest press x10, then OHP x10  Horizontal abd red band 3x10    08/08/23 NuStep L5x41mins Walking outdoors [address], up and down curbs w/o SPC  Leg ext 10# 3x10  HS curls 25# 3x10 3 way cone taps standing on airex- minA and using some occasional UE touches in bars Rockerboard BERG 49/56   08/04/23 NuStep L5x51mins  Walking without AD 2 big lap with weight, 2 laps without  Tandem stance in bars SLS in bars  Cone taps on airex  Step ups 6" 1HRA On airex ball toss- slight LOB onto toes, 1 full LOB STS on airex with chest press red ball 2x8  08/01/23:  Neuromuscular re education:  Gait 500' without device, 2 x weaving to L noted, with answering questions, head turn TUG, 13.58 sec  5 x  sit to stand, no UE support  Balance activities to address stability with L LE: Standing R foot on airex, L foot on floor, static and with head turns 15 sec each Tandem standing with R UE support, able to manage without UE support 15 sec Side stepping along length of mat 6 x Walking 20' intervals with horizontal head turns 4 x Standing with L foot on 4" step with horizontal head turns 30 sec     07/28/23 NuStep L5x60mins  Walking on beam needs bars Marching on airex min-modA  Walking without device 2 laps with 1 rest break  STS on airex 2x10 Calf raises 2x12 Side steps  Red TB horizontal abd for posture 2x10   07/21/23 NuStep L5x12mins  Obstacles course around cones, over half foam roll, and on airex without walker min-modA Side steps on beam  Step ups 6" 1 handrail use  Cone taps on airex needs UE help  STS with chest press holding red ball  Standing on airex rows and ext red band 2x10 On airex reaching for different targets   07/19/23  Nustep L5x8 minutes all four extremities for w/u and neural priming STS with 3# x10 no UEs  Weighted rollator pushes for endurance/strength: 244ft x2 with 22#, then 244ft with 36# Forward step ups 4 inch step x12 B  Gait no device close S x259ft  Multiple rounds of gait without walker between chair and // bars   Tandem stance blue foam pad 2x30 seconds B  Tandem gait in // bars solid surface forward and backwards x4 rounds   07/13/23 NuStep L5x36mins Marching with rollator 2# 2x10 Hip abd with rollator 2# 2x10  LAQ 2# 2x10 HS curls red 2x10 STS 2x10 elevated mat table  Box taps 4"  Step ups 4" Side steps in bars  Side steps over obstacles in bars Horizontal abduction 2x10 for postural strengthening    07/12/23 EVAL    PATIENT EDUCATION: Education details: POC, HEP Person educated: Patient and Child(ren) Education method: Medical illustrator Education comprehension: verbalized understanding and returned  demonstration  HOME EXERCISE PROGRAM: Access Code: [email or web address] URL: [email or web address] Date: 07/12/2023 Prepared by: Donavon Fudge  Exercises - Standing Hip Abduction with Counter Support  - 1 x daily - 7 x weekly - 2 sets - 10 reps - Mini Squat with Counter Support  - 1 x daily - 7 x weekly - 2 sets - 10 reps - Standing March with Counter Support  - 1 x daily - 7 x weekly - 2 sets - 10 reps - Standing Tandem Balance with Counter Support  - 1 x daily - 7 x weekly - 3 reps - 30  hold - Standing Single Leg Stance with Counter Support  - 1 x daily - 7 x weekly - 5 reps - 10 hold  GOALS: Goals reviewed with patient? Yes  SHORT TERM GOALS: Target date: 08/23/23  Patient will be independent with initial HEP. Baseline: 07/12/23- given Goal status: MET 07/28/23  2.  Patient will demonstrate decreased fall risk by scoring < 15 sec on TUG with AD. Baseline: 26.97s Goal status: MET 19s 07/28/23, MET    LONG TERM GOALS: Target date: 10/04/23  Patient will be independent with advanced/ongoing HEP to improve outcomes and carryover.  Baseline:  Goal status: progressing 08/22/23  2.  Patient will be able to ambulate 300' without AD and good safety to access community.  Baseline: using rollator at eval Goal status: MET 08/04/23   3.  Patient will demonstrate improved functional LE strength as demonstrated by 5xSTS w/o UE push off <15s. Baseline: 22.38s Goal status:08/01/23:  MET so far  12.74 sec   4.  Patient will score 51 on Berg Balance test to demonstrate lower risk of falls. (MCID= 8 points) Baseline: 43 Goal status: MET 49- 08/08/23, 50- 08/15/23, 52- 08/24/23  5.  Patient will demonstrate tandem stance >30s and SLS > 10s independently  Baseline:  Goal status: IN PROGRESS 08/04/23, 08/15/23 still needs UE help, 08/24/23  6.  Patient will demonstrate decreased fall risk by scoring < 15 sec on TUG without AD. Baseline: 28.32s Goal status: 08/01/23: MET so far 13.58  sec  ASSESSMENT:  CLINICAL IMPRESSION: Patient continues to show good progress, she is walking with a SPC but not really using it. She has the biggest issue with tandem stance and with balance on dynamic surfaces. She reports she feels good about her progress and wants to get back to being independent. For the most part she is safe, but at times with outdoor ambulation she veers to the left. Was advised to keep the cane with her when out the house for safety. Talked with her about the need to stay active, and getting out and walking mostly on paved surfaces. Pt has improved confidence and advised to call us  if she has any changes, concerns, or questions.    EVAL: Patient is a 83 y.o. female who was seen today for physical therapy evaluation and treatment for CVA. She is currently ambulating with a rollator but was fully independently prior to the stroke on 07/04/23. With assessments completed today, she does present as a fall risk. She was able to ambulate a few feet without the rollator very carefully and slowly. She would like to be able to return to do her daily 30 min walks. Patient will benefit from skilled PT to address her balance, strength, and gait abnormalities to improve her confidence, increase her functional independence and allow her to return to PLOF.   OBJECTIVE IMPAIRMENTS: Abnormal gait, decreased activity tolerance, decreased balance, decreased endurance, difficulty walking, decreased strength, improper body mechanics, postural dysfunction, and pain.   ACTIVITY LIMITATIONS: squatting, stairs, transfers, and locomotion level  PARTICIPATION LIMITATIONS: cleaning, laundry, and yard work  [facility] POTENTIAL: Good  CLINICAL DECISION MAKING: Stable/uncomplicated  EVALUATION COMPLEXITY: Low  PLAN:  PT FREQUENCY: 2x/week  PT DURATION: 12 weeks  PLANNED INTERVENTIONS: 97110-Therapeutic exercises, 97530- Therapeutic activity, [id number]- Neuromuscular re-education, 97535- Self Care,  97140- Manual therapy, [phone]- Gait training, Patient/Family education, Balance training, Stair training, Cryotherapy, and Moist heat  PLAN FOR NEXT SESSION: D/C  PHYSICAL THERAPY DISCHARGE SUMMARY  Visits from Start of Care: 13  Patient agrees to discharge. Patient goals were partially met. Patient is being discharged due to being pleased with the current functional level.  Donavon Fudge, PT, DPT 08/24/23 8:37 AM

## 2023-09-21 NOTE — Progress Notes (Signed)
 [facility]  - INTERNAL MEDICINE [facility] Medicare Wellness Visit Type:: Subsequent Annual Wellness Visit  Name: Aimee Hood Date of Birth: [birth year] Age: 83 y.o. 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 Does the patient have a support system (family, friend, church, Conservation officer, nature, etc)?: Yes   Do you have any dental concerns?: No In the past month, have you experienced a change in your bladder control?: No   Do you have any difficulty obtaining your medications?: No   Do you have trouble consistently taking or remembering to take all of your medications as prescribed?: No Patient rates overall stress level as: Some Does stress affect daily life?: No Typical amount of pain: some Does pain affect daily life?: No Are you currently prescribed opioids?: No                Depression Screening  Behavioral Health Screening  Patient Health Questionnaire-2 Score: 0 (09/21/2023  7:41 AM)      Patient's Depression screening/score = Negative        Social History (Tobacco/Drugs/Sexual Activity) Aimee Hood reports that she has never smoked. She has never used smokeless tobacco. Tobacco Use?: No How many times in the past year have you used a recreational drug or used a prescription medication for nonmedical reasons?: None Risk factors for sexually transmitted infections (i.e., multiple sexual partners): No Are you bothered by sexual problems?: No  Alcohol Screening How often do you have a drink containing alcohol?: Never How many standard drinks containing alcohol do you have on a typical day?: Never, 1 or 2 drinks How often do you have six or more drinks on one occasion?: Never Audit-C Score: 0  Physical Activity Regular exercise?: Yes Exercise frequency  (times per week): 7 Exercise intensity: light (like slow walking)  Diet        Home and Transportation Safety All rugs have non-skid backing?: N/A, no rugs All stairs or steps have railings?: N/A, no stairs Grab bars in the bathtub or shower?: Yes Have non-skid surface in bathtub or shower?: Yes Good home lighting?: Yes Regular seat belt use?: Yes      Activities of Daily Living Feed self?: Yes Bathe self?: Yes Dress self?: Yes Use toilet without assistance?: Yes Walk without assistance?: (!) No, needs assistance from Walking assistance provided by: cane, walker  Instrumental Activities of Daily Living Manage finances?: Yes Shop for themselves?: Yes Prepare meals?: Yes Use the telephone?: Yes Manage medications?: Yes   Performs basic housework/laundry?: Yes Drives?: Yes Primary transportation is: driving  Hearing Concerns about hearing?: (!) Yes Uses hearing aids?: No Hear whispered voice? (Observed): Yes  Vision Concerns about vision?: No Vision exam performed?: Yes  Fall Risk Is the patient ambulatory?: Yes One or more falls in the last year:: No Feels unsteady when walking:: No  Cognitive Assessment Has a diagnosis of dementia or cognitive impairment?: No Are there any memory concerns by the patient, others, or providers?: No              Advance Directives Living will?: Yes Advance directive information provided to patient: N/A Healthcare POA?: Yes Name of Health Care Agent: Aimee Hood Relationship to patient: Adult child Health Care Agent's phone number: [phone]         Other History I reviewed and updated the following risk factors and conditions as appropriate: Reviewed/Updated: Problem List, Medical  History, Surgical History, Family History, Medications, Allergies Reviewed/Updated: Vital Signs (height, weight, and BP), Immunizations, Health Maintenance Patient Care Team Updated: Done  Vital Signs BP 122/88 (BP Location: Left  arm, Patient Position: Sitting)   Pulse 71   Ht 1.651 m (5' 5)   Wt 95.4 kg (210 lb 6.4 oz)   SpO2 97%   BMI 35.01 kg/m   Screening and Immunizations Health Maintenance Status       Date Due Completion Dates   COVID-19 Vaccine (11 - 2024-25 season) 07/04/2023 01/04/2023, 01/04/2022   Diabetes Screening 03/30/2024 03/31/2023, 03/21/2023   DTaP/Tdap/Td Vaccines (2 - Td or Tdap) 06/30/2024 07/01/2014   Depression Screening 08/01/2024 08/02/2023   Comprehensive Annual Visit 09/20/2024 09/21/2023, 07/21/2022   Medicare Annual Wellness (AWV) Subsequent Visits 09/20/2024 09/21/2023, 07/21/2022       Immunization History  Administered Date(s) Administered  . Influenza Vaccine, Quadrivalent, Adjuvanted 01/21/2021, 01/04/2022  . Influenza, High-dose Seasonal, Quadrivalent, Preservative Free 01/16/2020  . Influenza, Unspecified 03/10/2013, 04/23/2014  . Influenza, high-dose, trivalent, PF 01/20/2016, 02/01/2017, 02/23/2018, 01/28/2019  . Influenza, split virus, trivalent, preservative 03/10/2013, 04/23/2014  . Moderna Covid-19, mRNA,LNP-S,PF 12+ Yrs 01/04/2022  . Moderna SARS-CoV-2 Primary Series 12+ yrs 01/17/2021  . Pfizer SARS-CoV-2 Primary Series 12+ yrs 05/03/2019, 05/03/2019, 05/14/2019, 05/24/2019, 01/06/2020, 01/06/2020, 08/25/2020  . Pneumococcal Conjugate 13-Valent 11/26/2013  . Pneumococcal Conjugate Vaccine 20-Valent (PREVNAR-20) 6 wks+ 03/01/2022  . Pneumococcal Polysaccharide Vaccine, 23 Valent (PNEUMOVAX-23) 2Y+ 10/03/2007  . TDAP VACCINE (BOOSTRIX,ADACEL) 7Y+ 07/01/2014  . Zoster, Live 10/22/2014  . Zoster, Unspecified 01/02/2018    Assessment/Plan: Subsequent Annual Wellness Visit: The topics above were reviewed with the patient.  Healthy lifestyle principles reviewed.  Recommendations provided when indicated.  Follow up 1 year for next wellness visit.  No orders of the defined types were placed in this encounter.   No orders of the defined types were placed in this  encounter.   Patient Care Team: Murray CHRISTELLA Amos, MD as PCP - General (Internal Medicine) Murray CHRISTELLA Amos, MD as PCP - Attributed  Electronically signed by: Pinkey ONEIDA Neve, CMA 09/21/2023 7:48 AM

## 2023-09-21 NOTE — Progress Notes (Signed)
 Subjective:  Patient ID: Aimee Hood is a 83 y.o. female.  HPI: Aimee Hood comes today for Medicare Wellness Exam and f/u of GERD. She states she is having more regurgitation of food and heartburn  since changing reflux medication. She denies dysphagia, painful swallowing, N/V, abdominal pain, hematochezia or melena. No CP, SOB or leg pain with exertion. No PND, orthopnea, lightheadedness, presyncope or syncope. No wheezing, cough.   The following portions of the patient's history were reviewed and updated as appropriate: allergies, current medications, past family history, past medical history, past social history, past surgical history, and problem list.  Review of Systems  Constitutional:  Negative for chills, fatigue and fever.  HENT:  Negative for ear pain, hearing loss, sinus pressure, sore throat and trouble swallowing.   Eyes:  Negative for visual disturbance.  Respiratory:  Negative for chest tightness and shortness of breath.   Cardiovascular:  Negative for chest pain, palpitations and leg swelling.  Gastrointestinal:  Negative for abdominal pain, blood in stool, diarrhea, nausea and vomiting.  Endocrine: Negative for cold intolerance, heat intolerance, polydipsia, polyphagia and polyuria.  Genitourinary:  Negative for dysuria.  Musculoskeletal:  Negative for back pain and myalgias.  Skin:  Negative for rash.  Neurological:  Negative for dizziness, syncope, light-headedness and headaches.  Psychiatric/Behavioral:  Negative for behavioral problems.     Objective Physical Exam Vitals and nursing note reviewed.  Constitutional:      Appearance: Normal appearance. She is obese.  HENT:     Head: Normocephalic and atraumatic.     Right Ear: Tympanic membrane, ear canal and external ear normal. There is no impacted cerumen.     Left Ear: Tympanic membrane, ear canal and external ear normal. There is no impacted cerumen.     Mouth/Throat:     Mouth: Mucous membranes are moist.      Pharynx: Oropharynx is clear. No oropharyngeal exudate or posterior oropharyngeal erythema.   Eyes:     Extraocular Movements: Extraocular movements intact.     Conjunctiva/sclera: Conjunctivae normal.     Pupils: Pupils are equal, round, and reactive to light.   Neck:     Vascular: No carotid bruit.   Cardiovascular:     Rate and Rhythm: Normal rate and regular rhythm.     Heart sounds: Normal heart sounds. No murmur heard.    No gallop.  Pulmonary:     Effort: Pulmonary effort is normal. No respiratory distress.     Breath sounds: Normal breath sounds. No wheezing or rhonchi.  Abdominal:     General: Bowel sounds are normal. There is no distension.     Palpations: Abdomen is soft. There is no mass.     Tenderness: There is no abdominal tenderness. There is no guarding or rebound.     Hernia: No hernia is present.   Musculoskeletal:        General: No tenderness. Normal range of motion.     Cervical back: Normal range of motion and neck supple. No rigidity or tenderness.     Right lower leg: No edema.     Left lower leg: No edema.  Lymphadenopathy:     Cervical: No cervical adenopathy.   Skin:    General: Skin is warm.     Findings: No erythema.   Neurological:     General: No focal deficit present.     Mental Status: She is alert and oriented to person, place, and time.     Cranial Nerves: No  cranial nerve deficit.     Motor: No weakness.     Coordination: Coordination normal.     Gait: Gait normal.     Deep Tendon Reflexes: Reflexes normal.   Psychiatric:        Mood and Affect: Mood normal.        Behavior: Behavior normal.      Assessment   1. Encounter for Medicare annual wellness exam (Primary).  No family history of colon or breast cancer in first degree relatives. She has OBGYN. Pt is up to date with colonoscopy, ekg, mammogram, flu, covid, tdap, shingrix and pneumonia vaccines. She is due for bone density. She lost 2 lbs in 6 months.   2.  Gastroesophageal reflux disease without esophagitis.  She is taking Pantoprazole  40 mg every day. She was switched from Omeprazole  to Pantoprazole  due to initiation of Plavix . She mentioned she is having more regurgitation of food and heartburn since.   PLAN: D/C Pantoprazole . Lansoprazole 30 mg every day. May increase to bid if needed.   3. Osteoporosis screening.  Due for bone density.  PLAN: - DEXA Scan 1 or More Sites Axial; Future  Behavioral Health Screening  Patient Health Questionnaire-2 Score: 0 (09/21/2023  7:41 AM)      Patient's Depression screening/score = Negative    Depression Plan: Normal/Negative Screening   Follow-up in 2 months.      Plan See above.  This document serves as a record of services personally performed by Dr. Delilah. It was created on their behalf by Pinkey ONEIDA Neve, CMA, a trained medical scribe, and Certified Medical Assistance (CMA). During the course of documenting the history, physical exam and medical decision making, I was functioning as a Stage manager. The creation of this record is the provider's dictation and/or activities during the visit.  Electronically signed by Pinkey ONEIDA Neve, CMA 09/21/2023 7:48 AM

## 2023-09-26 ENCOUNTER — Encounter: Payer: Self-pay | Admitting: Cardiology

## 2023-09-26 ENCOUNTER — Ambulatory Visit: Attending: Cardiology | Admitting: Cardiology

## 2023-09-26 VITALS — BP 122/74 | HR 83 | Ht 66.0 in | Wt 210.0 lb

## 2023-09-26 DIAGNOSIS — E782 Mixed hyperlipidemia: Secondary | ICD-10-CM

## 2023-09-26 DIAGNOSIS — Z8673 Personal history of transient ischemic attack (TIA), and cerebral infarction without residual deficits: Secondary | ICD-10-CM | POA: Diagnosis not present

## 2023-09-26 DIAGNOSIS — I10 Essential (primary) hypertension: Secondary | ICD-10-CM

## 2023-09-26 NOTE — Patient Instructions (Signed)
 Medication Instructions:  The current medical regimen is effective;  continue present plan and medications.  *If you need a refill on your cardiac medications before your next appointment, please call your pharmacy*  Follow-Up: At [facility], you and your health needs are our priority.  As part of our continuing mission to provide you with exceptional heart care, our providers are all part of one team.  This team includes your primary Cardiologist (physician) and Advanced Practice Providers or APPs (Physician Assistants and Nurse Practitioners) who all work together to provide you with the care you need, when you need it.  Your next appointment:   Follow up as needed with Dr Renna Cary

## 2023-09-26 NOTE — Progress Notes (Signed)
 Cardiology Office Note:  .   Date:  09/26/2023  ID:  Aimee Hood, DOB [birth year], MRN [id number] PCP: Lavenia Post., MD  [facility] Providers Cardiologist:  Dorothye Gathers, MD     History of Present Illness: .   Aimee Hood is a 83 y.o. female Discussed the use of AI scribe software for clinical note transcription with the patient, who gave verbal consent to proceed.  History of Present Illness Aimee Hood is an 83 year old female with coronary artery disease who presents for a follow-up visit. She is accompanied by her son, [name].  She recently wore an event monitor in May 2025, which showed an average heart rate of 67 beats per minute in sinus rhythm with no evidence of atrial fibrillation. Her heart rate transiently dropped to 43 beats per minute during sleep, and she had rare premature ventricular contractions and premature atrial contractions.  She has a history of left-sided weakness /stroke. Initially post stroke, she was on aspirin  and Plavix  but transitioned to Plavix  monotherapy three weeks post-stroke. She currently takes Plavix  75 mg daily, metoprolol  50 mg daily, ramipril  10 mg daily, and rosuvastatin  20 mg daily.  In 2018, she underwent a cardiac catheterization that showed mild non-obstructive coronary artery disease with 20% mid LAD stenosis. 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 Her most recent echocardiogram in 2023 showed normal pump function.  She experiences trouble sleeping at night due to restless legs, described as 'kicking around and moving.' She also has acid reflux, which worsened with a previous medication change post-stroke. She is currently on pantoprazole , started about a week ago.  No chest pain or unusual shortness of breath. Her creatinine level was 0.94 in March, indicating normal kidney function. Her hemoglobin was 13, showing no anemia, and her LDL cholesterol was 38, which is  well-controlled with rosuvastatin .       Studies Reviewed: .        Results LABS Creatinine: 0.94 (06/2023) Hemoglobin: 13 (06/2023) LDL cholesterol: 38 (06/2023)  DIAGNOSTIC Event monitor: Average heart rate 67 bpm, sinus rhythm, no atrial fibrillation, heart rate dropped to 43 bpm transiently during sleep, rare PVCs and PACs (08/2023) Cardiac catheterization: Mild nonobstructive CAD, 20% mid LAD stenosis, ejection fraction 35%, stress-induced cardiomyopathy (2018) Echocardiogram: Normal left ventricular function (2023), reviewed from outside records.  Risk Assessment/Calculations:            Physical Exam:   VS:  BP 122/74   Pulse 83   Ht 5' 6 (1.676 m)   Wt 210 lb (95.3 kg)   SpO2 97%   BMI 33.89 kg/m    Wt Readings from Last 3 Encounters:  09/26/23 210 lb (95.3 kg)  07/05/23 212 lb 8.4 oz (96.4 kg)  01/24/19 230 lb (104.3 kg)    GEN: Well nourished, well developed in no acute distress NECK: No JVD; No carotid bruits CARDIAC: RRR, no murmurs, no rubs, no gallops RESPIRATORY:  Clear to auscultation without rales, wheezing or rhonchi  ABDOMEN: Soft, non-tender, non-distended EXTREMITIES:  No edema; No deformity   ASSESSMENT AND PLAN: .    Assessment and Plan Assessment & Plan Stroke No evidence of atrial fibrillation on recent event monitor, reassuring given stroke history. Continues on Plavix  monotherapy to minimize recurrence risk. No current chest pain or dyspnea. Recent echocardiogram shows normal cardiac function. - Continue Plavix  75 mg daily - Continue rosuvastatin  20 mg a day.  Diet, exercise.  Nonobstructive  coronary artery disease Mild nonobstructive CAD with 20% mid LAD stenosis. Recent echocardiogram shows normal cardiac function. No current chest pain or dyspnea. - Continue current management and lifestyle modifications - Follow up as needed with cardiology  Hypertension Metoprolol  50 mg and ramipril  10 mg are part of the current  regimen.  Hyperlipidemia LDL cholesterol is well-controlled at 38 mg/dL with rosuvastatin , reducing cardiovascular event risk. - Continue rosuvastatin  20 mg daily  Restless legs syndrome Experiencing restless legs at night, causing sleep disturbances. - Consider referral to neurology if symptoms persist  Gastroesophageal reflux disease (GERD) Recent change to pantoprazole  due to previous medication exacerbating symptoms. Current management is satisfactory.  Protonix  is currently on her list.         Dispo: As needed  Signed, Dorothye Gathers, MD

## 2023-12-08 NOTE — Telephone Encounter (Signed)
 I spoke with her son. She has been sleeping better with 600 mg of Gabapentin nightly. E-Prescribed. She also had a low Ferritin which can be a reversible cause of restless legs. She will begin supplemental iron and will have a repeat Ferritin in December, lab ordered.

## 2023-12-15 ENCOUNTER — Encounter: Payer: Self-pay | Admitting: Physical Therapy

## 2023-12-15 ENCOUNTER — Ambulatory Visit: Attending: Internal Medicine | Admitting: Physical Therapy

## 2023-12-15 ENCOUNTER — Other Ambulatory Visit: Payer: Self-pay

## 2023-12-15 DIAGNOSIS — R2681 Unsteadiness on feet: Secondary | ICD-10-CM | POA: Insufficient documentation

## 2023-12-15 DIAGNOSIS — M6281 Muscle weakness (generalized): Secondary | ICD-10-CM | POA: Diagnosis present

## 2023-12-15 DIAGNOSIS — R278 Other lack of coordination: Secondary | ICD-10-CM | POA: Insufficient documentation

## 2023-12-15 DIAGNOSIS — I639 Cerebral infarction, unspecified: Secondary | ICD-10-CM | POA: Diagnosis present

## 2023-12-15 DIAGNOSIS — R2689 Other abnormalities of gait and mobility: Secondary | ICD-10-CM | POA: Diagnosis present

## 2023-12-15 NOTE — Therapy (Signed)
 OUTPATIENT PHYSICAL THERAPY EVALUATION   Patient Name: Aimee Hood MRN: [id number] DOB:[birth year], 83 y.o., female Today's Date: 12/15/2023  END OF SESSION:  PT End of Session - 12/15/23 1341     Visit Number 1    Number of Visits 13    Date for PT Re-Evaluation 01/26/24    Authorization Type HTA    Authorization Time Period 12/15/23 to 01/26/24    Progress Note Due on Visit 10    PT Start Time 1340    PT Stop Time 1418    PT Time Calculation (min) 38 min    Activity Tolerance Patient tolerated treatment well    Behavior During Therapy [facility] for tasks assessed/performed          Past Medical History:  Diagnosis Date   Allergy    Arthritis    all over (07/10/2013)   CAD (coronary artery disease)    a. normal cors by cath in 2016  b. 08/2016: admitted with an NSTEMI. Cath showed mild nonobstructive CAD with only 20% mid-LAD stenosis. EF was reduced to 35% and wall motion abnormalities were suggestive of a stress-induced cardiomyopathy.   Carpal tunnel syndrome of left wrist    Cerebral aneurysm, nonruptured    3-4 mm in size, stable since 2004 (most recent MRI 2014)   Colon polyp 2007   Diverticulitis    Family history of anesthesia complication    brother had PONV   Gastroesophageal reflux disease    High cholesterol    Hypertension    Insomnia    Macular degeneration 08/31/2016   PONV (postoperative nausea and vomiting)    Restless leg syndrome    Stroke [facility]) 02/2013   denies residual on 07/10/2013)   Past Surgical History:  Procedure Laterality Date   BREAST SURGERY     CHOLECYSTECTOMY     JOINT REPLACEMENT     KNEE ARTHROSCOPY Bilateral    LEFT HEART CATH AND CORONARY ANGIOGRAPHY N/A 09/01/2016   Procedure: Left Heart Cath and Coronary Angiography;  Surgeon: Darron Deatrice LABOR, MD;  Location: [facility] INVASIVE CV LAB;  Service: Cardiovascular;  Laterality: N/A;   LEFT HEART CATHETERIZATION WITH CORONARY ANGIOGRAM N/A 06/20/2014   Procedure: LEFT HEART  CATHETERIZATION WITH CORONARY ANGIOGRAM;  Surgeon: Oneil JAYSON Parchment, MD;  Location: [facility] CATH LAB;  Service: Cardiovascular;  Laterality: N/A;   TEE WITHOUT CARDIOVERSION N/A 03/14/2013   Procedure: TRANSESOPHAGEAL ECHOCARDIOGRAM (TEE);  Surgeon: Oneil Parchment, MD;  Location: [facility] ENDOSCOPY;  Service: Cardiovascular;  Laterality: N/A;   TOTAL KNEE ARTHROPLASTY Left 07/10/2013   TOTAL KNEE ARTHROPLASTY Left 07/10/2013   Procedure: TOTAL KNEE ARTHROPLASTY;  Surgeon: Maude LELON Right, MD;  Location: 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   NSTEMI (non-ST elevated myocardial infarction) (HCC)    Chest pain with moderate risk of acute coronary syndrome 08/31/2016   Normal coronary arteries-2016 08/31/2016   Chest pain on exertion 06/11/2014   Osteoarthritis of left knee 07/12/2013   S/P total knee replacement using cement 07/10/2013   Aneurysm, cerebral, nonruptured 04/23/2013   History of LMCA CVA-TPA 2014 03/09/2013  Osteoarthritis 11/14/2012   Restless legs syndrome 11/14/2012   GERD (gastroesophageal reflux disease) 11/13/2012   Essential hypertension 11/05/2012   Other and unspecified hyperlipidemia 11/05/2012    PCP: Delilah Murray HERO., MD  REFERRING PROVIDER: Delilah Murray HERO., MD  REFERRING DIAG: Diagnosis R26.9 (ICD-10-CM) - Unspecified abnormalities of gait and mobility  THERAPY DIAG:  Other lack of coordination  Muscle weakness (generalized)  Unsteadiness on feet  Other abnormalities of gait and mobility  Rationale for Evaluation and Treatment:  Rehabilitation  ONSET DATE: feelings of backsliding started 2 months ago   SUBJECTIVE:   SUBJECTIVE STATEMENT:  I just felt like I've been going backwards. Had that stroke in march and came here for PT after that, finished up in mid-May. I feel like I'm not doing as well. Have been keeping up with some of the exercises that they gave me, I just feel like I'm moving slower and R LE is giving me more trouble. It seems like its not as strong as the left. Think I'm doing pretty well otherwise.   PERTINENT HISTORY:  See above    PAIN:  Are you having pain? No  PRECAUTIONS: None  RED FLAGS: None   WEIGHT BEARING RESTRICTIONS: No  FALLS:  Has patient fallen in last 6 months? 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  DIAGNOSTIC FINDINGS:   From March 2025:  Narrative & Impression CLINICAL DATA:  83 year old female code stroke presentation. Left side deficit.   EXAM: MRI HEAD WITHOUT CONTRAST   TECHNIQUE: Multiplanar, multiecho pulse sequences of the brain and surrounding structures were obtained without intravenous contrast.   COMPARISON:  CT head, CTA and CTP today reported separately. [facility] brain MRI 06/10/2018.   FINDINGS: Brain: 11 mm wedge-shaped area of restricted diffusion in the posterior right corona radiata tracking toward the superior right insula (series 11, image 19). Subtle T2  and FLAIR hyperintensity associated.   No other convincing diffusion restriction.   Since 2020 progressed chronic encephalomalacia and gliosis in the posterior left MCA territory. Cortical and white matter involvement there. Small chronic infarct in the inferior left cerebellum is stable. Small chronic lacunar infarcts in the bilateral deep gray nuclei appear more numerous. No chronic cerebral blood products identified on SWI. No midline shift, mass effect, evidence of mass lesion, ventriculomegaly, extra-axial collection or acute intracranial hemorrhage. Cervicomedullary junction and pituitary are within normal limits.   Vascular: Major intracranial vascular flow voids are stable since 2020.   Skull and upper cervical spine: Negative. Visualized bone marrow signal is within normal limits.   Sinuses/Orbits: Left sphenoid sinus disease redemonstrated. Mild mucosal thickening elsewhere. Postoperative changes to both globes.   Other: Mastoids well aerated. Visible scalp and face appear within normal limits.   IMPRESSION: 1. Positive for a small 11 mm Acute Infarct in the posterior right corona radiata tracking toward the insula. No associated hemorrhage or mass effect. 2. Progressed chronic ischemic disease since a 2020  MRI, which is otherwise most pronounced in the posterior left MCA territory.      PATIENT SURVEYS:  PSFS: THE PATIENT SPECIFIC FUNCTIONAL SCALE  Place score of 0-10 (0 = unable to perform activity and 10 = able to perform activity at the same level as before injury or problem)  Activity Date: 12/15/23 eval     Going up and down steps  6    2.      3.     4.      Total Score 6      Total Score = Sum of activity scores/number of activities  Minimally Detectable Change: 3 points (for single activity); 2 points (for average score)  [facility] (nd). The Patient Specific Functional Scale . Retrieved from  [email or web address]   COGNITION: Overall cognitive status: Within functional limits for tasks assessed     SENSATION: Not tested     LOWER EXTREMITY MMT:  MMT Right eval Left eval  Hip flexion 3 4  Hip extension    Hip abduction 3 3  Hip adduction    Hip internal rotation    Hip external rotation    Knee flexion 3+ 4  Knee extension 3+ 4+  Ankle dorsiflexion 5 5  Ankle plantarflexion    Ankle inversion    Ankle eversion     (Blank rows = not tested)    FUNCTIONAL TESTS:  5 times sit to stand: 13 seconds hands on thighs  3 minute walk test: 480ft with SPC             DGI 14/24  GAIT: Distance walked: 426ft Assistive device utilized: Single point cane Level of assistance: Modified independence Comments: slow but steady with SPC                                                                                                                                 TREATMENT DATE:   12/15/23   Exam findings, POC, education as below and HEP practice/discussion of new exercises as appropriate    PATIENT EDUCATION:  Education details: exam findings, POC, HEP updates, possible transitions to gym based program at [employer]) to help prevent another backslide  Person educated: Patient Education method: Programmer, multimedia, Facilities manager, Verbal cues, and Handouts Education comprehension: verbalized understanding, returned demonstration, and needs further education  HOME EXERCISE PROGRAM:  Access Code: [email or web address] URL: [email or web address] Date: 12/15/2023 Prepared by: Josette Rough  Exercises - Standing Hip Abduction with Counter Support  - 1 x daily - 7 x weekly - 2 sets - 10 reps - Mini Squat with Counter Support  - 1 x daily - 7 x weekly - 2 sets - 10 reps - Standing March with Counter Support  - 1 x daily - 7 x weekly - 2 sets - 10 reps - Standing Tandem Balance with Counter Support  - 1 x daily - 7 x weekly  - 3 reps - 30 hold -  Standing Single Leg Stance with Counter Support  - 1 x daily - 7 x weekly - 5 reps - 10 hold - Supine Bridge  - 1 x daily - 7 x weekly - 1-2 sets - 10 reps - Sit to Stand with Resistance Around Legs  - 1 x daily - 7 x weekly - 1-2 sets - 10 reps - Tandem Walking with Counter Support  - 1 x daily - 7 x weekly - 1 sets - 5 reps  ASSESSMENT:  CLINICAL IMPRESSION:  Patient is a 83 y.o. F who was seen today for physical therapy evaluation and treatment for Diagnosis R26.9 (ICD-10-CM) - Unspecified abnormalities of gait and mobility. She does seem to have lost some strength since her DC in May, also shows significant fall risk based on DGI. Has a lot of eccentric quad weakness, had one moment where knee buckled when descending stairs but she was able to recover with rails without PT assist.  Will benefit from skilled PT interventions with potential transition to [facility] PREP program at [place].   OBJECTIVE IMPAIRMENTS: Abnormal gait, decreased activity tolerance, decreased balance, decreased knowledge of use of DME, decreased mobility, difficulty walking, and decreased strength.   ACTIVITY LIMITATIONS: standing, squatting, stairs, transfers, and locomotion level  PARTICIPATION LIMITATIONS: meal prep, cleaning, laundry, driving, shopping, community activity, occupation, and yard work  PERSONAL FACTORS: Age, Behavior pattern, Education, Fitness, Past/current experiences, Profession, Sex, Social background, and Time since onset of injury/illness/exacerbation are also affecting patient's functional outcome.   REHAB POTENTIAL: Good  CLINICAL DECISION MAKING: Stable/uncomplicated  EVALUATION COMPLEXITY: Low   GOALS: Goals reviewed with patient? No  SHORT TERM GOALS: Target date: 01/05/2024   Will be compliant with appropriate progressive HEP  Baseline: Goal status: INITIAL      LONG TERM GOALS: Target date: 01/26/2024      MMT to have improved by one grade in all weak  groups  Baseline:  Goal status: INITIAL  2.  Will score at least 20 on DGI to show reduced fall risk  Baseline:  Goal status: INITIAL  3.  Will ambulate at least 560ft in with no device in order to show improved functional activity tolerance  Baseline:  Goal status: INITIAL  4.  Will be able to ascend/descend steps no rails without knee buckling to demonstrate improved eccentric strength/motor control  Baseline:  Goal status: INITIAL  5.  Will be compliant with appropriate gym based exercise program (transition to [facility] PREP if she is interested) Baseline:  Goal status: INITIAL     PLAN:  PT FREQUENCY: 2x/week  PT DURATION: 6 weeks  PLANNED INTERVENTIONS: 97750- Physical Performance Testing, 97110-Therapeutic exercises, 97530- Therapeutic activity, [id number]- Neuromuscular re-education, 97535- Self Care, [id number]- Manual therapy, and [id number]- Gait training  PLAN FOR NEXT SESSION: conditioning, strength, balance, PRE   Josette Rough, PT, DPT 12/15/23 2:27 PM

## 2023-12-20 ENCOUNTER — Ambulatory Visit: Admitting: Physical Therapy

## 2023-12-20 ENCOUNTER — Encounter: Payer: Self-pay | Admitting: Physical Therapy

## 2023-12-20 DIAGNOSIS — M6281 Muscle weakness (generalized): Secondary | ICD-10-CM

## 2023-12-20 DIAGNOSIS — R278 Other lack of coordination: Secondary | ICD-10-CM

## 2023-12-20 DIAGNOSIS — R2689 Other abnormalities of gait and mobility: Secondary | ICD-10-CM

## 2023-12-20 DIAGNOSIS — R2681 Unsteadiness on feet: Secondary | ICD-10-CM

## 2023-12-20 NOTE — Therapy (Signed)
 OUTPATIENT PHYSICAL THERAPY TREATMENT   Patient Name: Aimee Hood MRN: [id number] DOB:[birth year], 83 y.o., female Today's Date: 12/20/2023  END OF SESSION:  PT End of Session - 12/20/23 1010     Visit Number 2    Date for PT Re-Evaluation 01/26/24    PT Start Time 1015    PT Stop Time 1100    PT Time Calculation (min) 45 min    Activity Tolerance Patient tolerated treatment well    Behavior During Therapy [facility] for tasks assessed/performed          Past Medical History:  Diagnosis Date   Allergy    Arthritis    all over (07/10/2013)   CAD (coronary artery disease)    a. normal cors by cath in 2016  b. 08/2016: admitted with an NSTEMI. Cath showed mild nonobstructive CAD with only 20% mid-LAD stenosis. EF was reduced to 35% and wall motion abnormalities were suggestive of a stress-induced cardiomyopathy.   Carpal tunnel syndrome of left wrist    Cerebral aneurysm, nonruptured    3-4 mm in size, stable since 2004 (most recent MRI 2014)   Colon polyp 2007   Diverticulitis    Family history of anesthesia complication    brother had PONV   Gastroesophageal reflux disease    High cholesterol    Hypertension    Insomnia    Macular degeneration 08/31/2016   PONV (postoperative nausea and vomiting)    Restless leg syndrome    Stroke [facility]) 02/2013   denies residual on 07/10/2013)   Past Surgical History:  Procedure Laterality Date   BREAST SURGERY     CHOLECYSTECTOMY     JOINT REPLACEMENT     KNEE ARTHROSCOPY Bilateral    LEFT HEART CATH AND CORONARY ANGIOGRAPHY N/A 09/01/2016   Procedure: Left Heart Cath and Coronary Angiography;  Surgeon: Darron Deatrice LABOR, MD;  Location: [facility] INVASIVE CV LAB;  Service: Cardiovascular;  Laterality: N/A;   LEFT HEART CATHETERIZATION WITH CORONARY ANGIOGRAM N/A 06/20/2014   Procedure: LEFT HEART CATHETERIZATION WITH CORONARY ANGIOGRAM;  Surgeon: Oneil JAYSON Parchment, MD;  Location: [facility] CATH LAB;  Service: Cardiovascular;  Laterality: N/A;   TEE  WITHOUT CARDIOVERSION N/A 03/14/2013   Procedure: TRANSESOPHAGEAL ECHOCARDIOGRAM (TEE);  Surgeon: Oneil Parchment, MD;  Location: [facility] ENDOSCOPY;  Service: Cardiovascular;  Laterality: N/A;   TOTAL KNEE ARTHROPLASTY Left 07/10/2013   TOTAL KNEE ARTHROPLASTY Left 07/10/2013   Procedure: TOTAL KNEE ARTHROPLASTY;  Surgeon: Maude LELON Right, MD;  Location: 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   NSTEMI (non-ST elevated myocardial infarction) (HCC)    Chest pain with moderate risk of acute coronary syndrome 08/31/2016   Normal coronary arteries-2016 08/31/2016   Chest pain on exertion 06/11/2014   Osteoarthritis of left knee 07/12/2013   S/P total knee replacement using cement 07/10/2013   Aneurysm, cerebral, nonruptured 04/23/2013   History of LMCA CVA-TPA 2014 03/09/2013   Osteoarthritis 11/14/2012   Restless legs syndrome 11/14/2012   GERD (gastroesophageal reflux disease) 11/13/2012   Essential hypertension 11/05/2012   Other and unspecified hyperlipidemia 11/05/2012    PCP:  Delilah Murray HERO., MD  REFERRING PROVIDER: Delilah Murray HERO., MD  REFERRING DIAG: Diagnosis R26.9 (ICD-10-CM) - Unspecified abnormalities of gait and mobility  THERAPY DIAG:  Other lack of coordination  Muscle weakness (generalized)  Unsteadiness on feet  Other abnormalities of gait and mobility  Rationale for Evaluation and Treatment: Rehabilitation  ONSET DATE: feelings of backsliding started 2 months ago   SUBJECTIVE:   SUBJECTIVE STATEMENT: Doing ok, walked this morning, did  some exercises, Don't feel like her legs are strong  I just felt like I've been going backwards. Had that stroke in march and came here for PT after that, finished up in mid-May. I feel like I'm not doing as well. Have been keeping up with some of the exercises that they gave me, I just feel like I'm moving slower and R LE is giving me more trouble. It seems like its not as strong as the left. Think I'm doing pretty well otherwise.   PERTINENT HISTORY:  See above    PAIN:  Are you having pain? No  PRECAUTIONS: None  RED FLAGS: None   WEIGHT BEARING RESTRICTIONS: No  FALLS:  Has patient fallen in last 6 months? 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  DIAGNOSTIC FINDINGS:   From March 2025:  Narrative & Impression CLINICAL DATA:  83 year old female code stroke presentation. Left side deficit.   EXAM: MRI HEAD WITHOUT CONTRAST   TECHNIQUE: Multiplanar, multiecho pulse sequences of the brain and surrounding structures were obtained without intravenous contrast.   COMPARISON:  CT head, CTA and CTP today reported separately. [facility] brain MRI 06/10/2018.   FINDINGS: Brain: 11 mm wedge-shaped area of restricted diffusion in the posterior right corona radiata tracking toward the superior right insula (series 11, image 19). Subtle T2 and FLAIR hyperintensity associated.   No other convincing  diffusion restriction.   Since 2020 progressed chronic encephalomalacia and gliosis in the posterior left MCA territory. Cortical and white matter involvement there. Small chronic infarct in the inferior left cerebellum is stable. Small chronic lacunar infarcts in the bilateral deep gray nuclei appear more numerous. No chronic cerebral blood products identified on SWI. No midline shift, mass effect, evidence of mass lesion, ventriculomegaly, extra-axial collection or acute intracranial hemorrhage. Cervicomedullary junction and pituitary are within normal limits.   Vascular: Major intracranial vascular flow voids are stable since 2020.   Skull and upper cervical spine: Negative. Visualized bone marrow signal is within normal limits.   Sinuses/Orbits: Left sphenoid sinus disease redemonstrated. Mild mucosal thickening elsewhere. Postoperative changes to both globes.   Other: Mastoids well aerated. Visible scalp and face appear within normal limits.   IMPRESSION: 1. Positive for a small 11 mm Acute Infarct in the posterior right corona radiata tracking toward the insula. No associated hemorrhage or mass effect. 2. Progressed chronic ischemic disease since a 2020 MRI, which is otherwise most pronounced in the posterior left MCA territory.  PATIENT SURVEYS:  PSFS: THE PATIENT SPECIFIC FUNCTIONAL SCALE  Place score of 0-10 (0 = unable to perform activity and 10 = able to perform activity at the same level as before injury or problem)  Activity Date: 12/15/23 eval     Going up and down steps  6    2.      3.     4.      Total Score 6      Total Score = Sum of activity scores/number of activities  Minimally Detectable Change: 3 points (for single activity); 2 points (for average score)  [facility] (nd). The Patient Specific Functional Scale . Retrieved from [email or web address]   COGNITION: Overall  cognitive status: Within functional limits for tasks assessed     SENSATION: Not tested     LOWER EXTREMITY MMT:  MMT Right eval Left eval  Hip flexion 3 4  Hip extension    Hip abduction 3 3  Hip adduction    Hip internal rotation    Hip external rotation    Knee flexion 3+ 4  Knee extension 3+ 4+  Ankle dorsiflexion 5 5  Ankle plantarflexion    Ankle inversion    Ankle eversion     (Blank rows = not tested)    FUNCTIONAL TESTS:  5 times sit to stand: 13 seconds hands on thighs  3 minute walk test: 443ft with SPC             DGI 14/24  GAIT: Distance walked: 445ft Assistive device utilized: Single point cane Level of assistance: Modified independence Comments: slow but steady with SPC                                                                                                                                 TREATMENT DATE:  12/20/23 NuStep L5 x 6 min HS curls 20lb x10, 25lb x10  RLE 15lb x10  Leg Ext 10lb 2x10  RLE 5lb x10 Sit to stands 2x10 UE on knees  Hip add ball squeeze Hip abd blue 2x10 4in step ups 1 rail x10 each  Cues for sequencing Heel raises from floor 2x10   12/15/23   Exam findings, POC, education as below and HEP practice/discussion of new exercises as appropriate    PATIENT EDUCATION:  Education details: exam findings, POC, HEP updates, possible transitions to gym based program at [employer]) to help prevent another backslide  Person educated: Patient Education method: Programmer, multimedia, Facilities manager, Verbal cues, and Handouts Education comprehension: verbalized understanding, returned demonstration, and needs further education  HOME EXERCISE PROGRAM:  Access Code: [email or web address] URL: [email or web address] Date: 12/15/2023 Prepared by: Josette Rough  Exercises - Standing Hip Abduction with Counter Support  - 1 x daily - 7 x weekly - 2 sets - 10 reps - Mini Squat with Counter Support  - 1 x daily - 7 x weekly - 2 sets -  10 reps - Standing  March with Counter Support  - 1 x daily - 7 x weekly - 2 sets - 10 reps - Standing Tandem Balance with Counter Support  - 1 x daily - 7 x weekly - 3 reps - 30 hold - Standing Single Leg Stance with Counter Support  - 1 x daily - 7 x weekly - 5 reps - 10 hold - Supine Bridge  - 1 x daily - 7 x weekly - 1-2 sets - 10 reps - Sit to Stand with Resistance Around Legs  - 1 x daily - 7 x weekly - 1-2 sets - 10 reps - Tandem Walking with Counter Support  - 1 x daily - 7 x weekly - 1 sets - 5 reps  ASSESSMENT:  CLINICAL IMPRESSION:  Patient is a 83 y.o. F who was seen today for physical therapy treatment for Diagnosis R26.9 (ICD-10-CM) - Unspecified abnormalities of gait and mobility. She enters doing well with reports of RLE weakness. No issues with functional task, but RLE feels like it is going to give a away at times. Initiated LE strengthening interventions. Cue for full ROM and to hold contraction with leg curls and extensions.  Will benefit from skilled PT interventions with potential transition to [facility] PREP program at [place].   OBJECTIVE IMPAIRMENTS: Abnormal gait, decreased activity tolerance, decreased balance, decreased knowledge of use of DME, decreased mobility, difficulty walking, and decreased strength.   ACTIVITY LIMITATIONS: standing, squatting, stairs, transfers, and locomotion level  PARTICIPATION LIMITATIONS: meal prep, cleaning, laundry, driving, shopping, community activity, occupation, and yard work  PERSONAL FACTORS: Age, Behavior pattern, Education, Fitness, Past/current experiences, Profession, Sex, Social background, and Time since onset of injury/illness/exacerbation are also affecting patient's functional outcome.   REHAB POTENTIAL: Good  CLINICAL DECISION MAKING: Stable/uncomplicated  EVALUATION COMPLEXITY: Low   GOALS: Goals reviewed with patient? No  SHORT TERM GOALS: Target date: 01/05/2024   Will be compliant with appropriate progressive  HEP  Baseline: Goal status: 12/20/23 Met      LONG TERM GOALS: Target date: 01/26/2024      MMT to have improved by one grade in all weak groups  Baseline:  Goal status: INITIAL  2.  Will score at least 20 on DGI to show reduced fall risk  Baseline:  Goal status: INITIAL  3.  Will ambulate at least 566ft in with no device in order to show improved functional activity tolerance  Baseline:  Goal status: INITIAL  4.  Will be able to ascend/descend steps no rails without knee buckling to demonstrate improved eccentric strength/motor control  Baseline:  Goal status: INITIAL  5.  Will be compliant with appropriate gym based exercise program (transition to [facility] PREP if she is interested) Baseline:  Goal status: INITIAL     PLAN:  PT FREQUENCY: 2x/week  PT DURATION: 6 weeks  PLANNED INTERVENTIONS: 97750- Physical Performance Testing, 97110-Therapeutic exercises, 97530- Therapeutic activity, [id number]- Neuromuscular re-education, 97535- Self Care, [id number]- Manual therapy, and [id number]- Gait training  PLAN FOR NEXT SESSION: conditioning, strength, balance, PRE   Tanda Sorrow, PTA 12/20/23 10:10 AM

## 2023-12-21 ENCOUNTER — Ambulatory Visit

## 2023-12-21 ENCOUNTER — Other Ambulatory Visit: Payer: Self-pay

## 2023-12-21 DIAGNOSIS — I639 Cerebral infarction, unspecified: Secondary | ICD-10-CM

## 2023-12-21 DIAGNOSIS — M6281 Muscle weakness (generalized): Secondary | ICD-10-CM

## 2023-12-21 DIAGNOSIS — R2681 Unsteadiness on feet: Secondary | ICD-10-CM

## 2023-12-21 DIAGNOSIS — R278 Other lack of coordination: Secondary | ICD-10-CM

## 2023-12-21 DIAGNOSIS — R2689 Other abnormalities of gait and mobility: Secondary | ICD-10-CM

## 2023-12-21 NOTE — Therapy (Signed)
 OUTPATIENT PHYSICAL THERAPY TREATMENT   Patient Name: Aimee Hood MRN: [id number] DOB:[birth year], 83 y.o., female Today's Date: 12/21/2023  END OF SESSION:  PT End of Session - 12/21/23 0822     Visit Number 3    Date for PT Re-Evaluation 01/26/24    Authorization Time Period 12/15/23 to 01/26/24    Progress Note Due on Visit 10    PT Start Time 0804    PT Stop Time 0845    PT Time Calculation (min) 41 min    Activity Tolerance Patient tolerated treatment well    Behavior During Therapy [facility] for tasks assessed/performed           Past Medical History:  Diagnosis Date   Allergy    Arthritis    all over (07/10/2013)   CAD (coronary artery disease)    a. normal cors by cath in 2016  b. 08/2016: admitted with an NSTEMI. Cath showed mild nonobstructive CAD with only 20% mid-LAD stenosis. EF was reduced to 35% and wall motion abnormalities were suggestive of a stress-induced cardiomyopathy.   Carpal tunnel syndrome of left wrist    Cerebral aneurysm, nonruptured    3-4 mm in size, stable since 2004 (most recent MRI 2014)   Colon polyp 2007   Diverticulitis    Family history of anesthesia complication    brother had PONV   Gastroesophageal reflux disease    High cholesterol    Hypertension    Insomnia    Macular degeneration 08/31/2016   PONV (postoperative nausea and vomiting)    Restless leg syndrome    Stroke [facility]) 02/2013   denies residual on 07/10/2013)   Past Surgical History:  Procedure Laterality Date   BREAST SURGERY     CHOLECYSTECTOMY     JOINT REPLACEMENT     KNEE ARTHROSCOPY Bilateral    LEFT HEART CATH AND CORONARY ANGIOGRAPHY N/A 09/01/2016   Procedure: Left Heart Cath and Coronary Angiography;  Surgeon: Darron Deatrice LABOR, MD;  Location: [facility] INVASIVE CV LAB;  Service: Cardiovascular;  Laterality: N/A;   LEFT HEART CATHETERIZATION WITH CORONARY ANGIOGRAM N/A 06/20/2014   Procedure: LEFT HEART CATHETERIZATION WITH CORONARY ANGIOGRAM;  Surgeon: Oneil JAYSON Parchment, MD;  Location: [facility] CATH LAB;  Service: Cardiovascular;  Laterality: N/A;   TEE WITHOUT CARDIOVERSION N/A 03/14/2013   Procedure: TRANSESOPHAGEAL ECHOCARDIOGRAM (TEE);  Surgeon: Oneil Parchment, MD;  Location[facility] ENDOSCOPY;  Service: Cardiovascular;  Laterality: N/A;   TOTAL KNEE ARTHROPLASTY Left 07/10/2013   TOTAL KNEE ARTHROPLASTY Left 07/10/2013   Procedure: TOTAL KNEE ARTHROPLASTY;  Surgeon: Maude LELON Right, MD;  Location: [facility] OR;  Service: Orthopedics;  Laterality: Left;   VAGINAL HYSTERECTOMY  2000's   Patient Active Problem List   Diagnosis Date Noted   CVA (cerebral vascular accident) (HCC) 07/05/2023   Left-sided weakness 07/04/2023   HLD (hyperlipidemia) 07/04/2023   GAD (generalized anxiety disorder) 07/04/2023   Impingement syndrome of right shoulder 10/28/2021   Carpal tunnel syndrome, bilateral 12/29/2018   Pain in right knee 12/05/2018   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   NSTEMI (non-ST elevated myocardial infarction) (HCC)    Chest pain with moderate risk of acute coronary syndrome 08/31/2016   Normal coronary arteries-2016 08/31/2016   Chest pain on exertion 06/11/2014   Osteoarthritis of left knee 07/12/2013   S/P total knee replacement using cement 07/10/2013   Aneurysm, cerebral, nonruptured 04/23/2013   History of LMCA CVA-TPA 2014 03/09/2013   Osteoarthritis 11/14/2012   Restless legs syndrome 11/14/2012   GERD (gastroesophageal  reflux disease) 11/13/2012   Essential hypertension 11/05/2012   Other and unspecified hyperlipidemia 11/05/2012    PCP: Delilah Murray HERO., MD  REFERRING PROVIDER: Delilah Murray HERO., MD  REFERRING DIAG: Diagnosis R26.9 (ICD-10-CM) - Unspecified abnormalities of gait and mobility  THERAPY DIAG:  Other lack of coordination  Muscle weakness (generalized)  Unsteadiness on feet  Other abnormalities of gait and mobility  Cerebrovascular accident (CVA), unspecified mechanism (HCC)  Rationale for Evaluation and Treatment:  Rehabilitation  ONSET DATE: feelings of backsliding started 2 months ago   SUBJECTIVE:   SUBJECTIVE STATEMENT: Getting along pretty well, not sore from yesterdays session but can tell I worked out a lot   I just felt like I've been going backwards. Had that stroke in march and came here for PT after that, finished up in mid-May. I feel like I'm not doing as well. Have been keeping up with some of the exercises that they gave me, I just feel like I'm moving slower and R LE is giving me more trouble. It seems like its not as strong as the left. Think I'm doing pretty well otherwise.   PERTINENT HISTORY:  See above    PAIN:  Are you having pain? No  PRECAUTIONS: None  RED FLAGS: None   WEIGHT BEARING RESTRICTIONS: No  FALLS:  Has patient fallen in last 6 months? 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  DIAGNOSTIC FINDINGS:   From March 2025:  Narrative & Impression CLINICAL DATA:  83 year old female code stroke presentation. Left side deficit.   EXAM: MRI HEAD WITHOUT CONTRAST   TECHNIQUE: Multiplanar, multiecho pulse sequences of the brain and surrounding structures were obtained without intravenous contrast.   COMPARISON:  CT head, CTA and CTP today reported separately. [facility] brain MRI 06/10/2018.   FINDINGS: Brain: 11 mm wedge-shaped area of restricted diffusion in the posterior  right corona radiata tracking toward the superior right insula (series 11, image 19). Subtle T2 and FLAIR hyperintensity associated.   No other convincing diffusion restriction.   Since 2020 progressed chronic encephalomalacia and gliosis in the posterior left MCA territory. Cortical and white matter involvement there. Small chronic infarct in the inferior left cerebellum is stable. Small chronic lacunar infarcts in the bilateral deep gray nuclei appear more numerous. No chronic cerebral blood products identified on SWI. No midline shift, mass effect, evidence of mass lesion, ventriculomegaly, extra-axial collection or acute intracranial hemorrhage. Cervicomedullary junction and pituitary are within normal limits.   Vascular: Major intracranial vascular flow voids are stable since 2020.   Skull and upper cervical spine: Negative. Visualized bone marrow signal is within normal limits.   Sinuses/Orbits: Left sphenoid sinus disease redemonstrated. Mild mucosal thickening elsewhere. Postoperative changes to both globes.   Other: Mastoids well aerated. Visible scalp and face appear within normal limits.   IMPRESSION: 1. Positive for a small 11 mm Acute Infarct in the posterior right corona radiata tracking toward the insula. No  associated hemorrhage or mass effect. 2. Progressed chronic ischemic disease since a 2020 MRI, which is otherwise most pronounced in the posterior left MCA territory.      PATIENT SURVEYS:  PSFS: THE PATIENT SPECIFIC FUNCTIONAL SCALE  Place score of 0-10 (0 = unable to perform activity and 10 = able to perform activity at the same level as before injury or problem)  Activity Date: 12/15/23 eval     Going up and down steps  6    2.      3.     4.      Total Score 6      Total Score = Sum of activity scores/number of activities  Minimally Detectable Change: 3 points (for single activity); 2 points (for average score)  [facility]  (nd). The Patient Specific Functional Scale . Retrieved from [email or web address]   COGNITION: Overall cognitive status: Within functional limits for tasks assessed     SENSATION: Not tested     LOWER EXTREMITY MMT:  MMT Right eval Left eval  Hip flexion 3 4  Hip extension    Hip abduction 3 3  Hip adduction    Hip internal rotation    Hip external rotation    Knee flexion 3+ 4  Knee extension 3+ 4+  Ankle dorsiflexion 5 5  Ankle plantarflexion    Ankle inversion    Ankle eversion     (Blank rows = not tested)    FUNCTIONAL TESTS:  5 times sit to stand: 13 seconds hands on thighs  3 minute walk test: 430ft with SPC             DGI 14/24  GAIT: Distance walked: 428ft Assistive device utilized: Single point cane Level of assistance: Modified independence Comments: slow but steady with SPC                                                                                                                                 TREATMENT DATE:  12/21/23: Sit to stand from elevated mat 10 x, with forward punches with red mediball In ll bars for forward lunges on compliant side of BOSU, 15x each leg In ll bars for heel /toe rocks on airex, mass practice In ll bars for alt hip abd with 1 1/2# cuff wts on each leg mass practice Hamstring curls 25# 2 x 10 Knee ext 10#, 2 x 10 Nustep L 6 x 6 min 30 sec  Standing with rail for support, semi tandem, up to 30 sec holds, tends to lose balance to L Slides towel on floor, for/back, side/side emphasis on maintaining upright hip position, avoiding L lateral hip lurch   12/20/23 NuStep L5 x 6 min HS curls 20lb x10, 25lb x10  RLE 15lb x10  Leg Ext 10lb 2x10  RLE 5lb x10 Sit to stands 2x10 UE on knees  Hip add ball squeeze Hip abd blue 2x10 4in step ups  1 rail x10 each  Cues for sequencing Heel raises from floor 2x10   12/15/23   Exam findings, POC, education as below and HEP  practice/discussion of new exercises as appropriate    PATIENT EDUCATION:  Education details: exam findings, POC, HEP updates, possible transitions to gym based program at [facility]) to help prevent another backslide  Person educated: Patient Education method: Programmer, multimedia, Demonstration, Verbal cues, and Handouts Education comprehension: verbalized understanding, returned demonstration, and needs further education  HOME EXERCISE PROGRAM:  Access Code: [email or web address] URL: [email or web address] Date: 12/15/2023 Prepared by: Josette Rough  Exercises - Standing Hip Abduction with Counter Support  - 1 x daily - 7 x weekly - 2 sets - 10 reps - Mini Squat with Counter Support  - 1 x daily - 7 x weekly - 2 sets - 10 reps - Standing March with Counter Support  - 1 x daily - 7 x weekly - 2 sets - 10 reps - Standing Tandem Balance with Counter Support  - 1 x daily - 7 x weekly - 3 reps - 30 hold - Standing Single Leg Stance with Counter Support  - 1 x daily - 7 x weekly - 5 reps - 10 hold - Supine Bridge  - 1 x daily - 7 x weekly - 1-2 sets - 10 reps - Sit to Stand with Resistance Around Legs  - 1 x daily - 7 x weekly - 1-2 sets - 10 reps - Tandem Walking with Counter Support  - 1 x daily - 7 x weekly - 1 sets - 5 reps  ASSESSMENT:  CLINICAL IMPRESSION:  Patient is a 83 y.o. F who participated  today with physical therapy treatment for Diagnosis R26.9 (ICD-10-CM) - Unspecified abnormalities of gait and mobility. She enters doing well with reports of RLE knee instability, and L LE weakness. Today we addressed a combination of standing balance and stability training, as well as functional lower extremity strengthening.  She tolerated well.   Will benefit from skilled PT interventions with potential transition to [facility] PREP program at [place].   OBJECTIVE IMPAIRMENTS: Abnormal gait, decreased activity tolerance, decreased balance, decreased knowledge of use of DME, decreased mobility,  difficulty walking, and decreased strength.   ACTIVITY LIMITATIONS: standing, squatting, stairs, transfers, and locomotion level  PARTICIPATION LIMITATIONS: meal prep, cleaning, laundry, driving, shopping, community activity, occupation, and yard work  PERSONAL FACTORS: Age, Behavior pattern, Education, Fitness, Past/current experiences, Profession, Sex, Social background, and Time since onset of injury/illness/exacerbation are also affecting patient's functional outcome.   REHAB POTENTIAL: Good  CLINICAL DECISION MAKING: Stable/uncomplicated  EVALUATION COMPLEXITY: Low   GOALS: Goals reviewed with patient? No  SHORT TERM GOALS: Target date: 01/05/2024   Will be compliant with appropriate progressive HEP  Baseline: Goal status: 12/20/23 Met  LONG TERM GOALS: Target date: 01/26/2024      MMT to have improved by one grade in all weak groups  Baseline:  Goal status: INITIAL  2.  Will score at least 20 on DGI to show reduced fall risk  Baseline:  Goal status: INITIAL  3.  Will ambulate at least 554ft in with no device in order to show improved functional activity tolerance  Baseline:  Goal status: INITIAL  4.  Will be able to ascend/descend steps no rails without knee buckling to demonstrate improved eccentric strength/motor control  Baseline:  Goal status: INITIAL  5.  Will be compliant with appropriate gym based exercise program (transition to [facility] PREP if she is interested) Baseline:  Goal status: INITIAL     PLAN:  PT FREQUENCY: 2x/week  PT DURATION: 6 weeks  PLANNED INTERVENTIONS: 97750- Physical Performance Testing, 97110-Therapeutic exercises, 97530- Therapeutic activity, [id number]- Neuromuscular re-education, 97535- Self Care, [id number]- Manual therapy, and [id number]- Gait training  PLAN FOR NEXT SESSION: conditioning, strength, balance, PRE   Tudor Chandley, PT, DPT, OCS 12/21/23 10:44 AM

## 2023-12-26 ENCOUNTER — Other Ambulatory Visit: Payer: Self-pay

## 2023-12-26 ENCOUNTER — Ambulatory Visit

## 2023-12-26 DIAGNOSIS — R278 Other lack of coordination: Secondary | ICD-10-CM | POA: Diagnosis not present

## 2023-12-26 DIAGNOSIS — R2689 Other abnormalities of gait and mobility: Secondary | ICD-10-CM

## 2023-12-26 DIAGNOSIS — R2681 Unsteadiness on feet: Secondary | ICD-10-CM

## 2023-12-26 DIAGNOSIS — M6281 Muscle weakness (generalized): Secondary | ICD-10-CM

## 2023-12-26 NOTE — Therapy (Signed)
 OUTPATIENT PHYSICAL THERAPY TREATMENT   Patient Name: Aimee Hood MRN: [id number] DOB:November 09, 1940, 83 y.o., female Today's Date: 12/26/2023  END OF SESSION:  PT End of Session - 12/26/23 1333     Visit Number 4    Number of Visits 13    Date for PT Re-Evaluation 01/26/24    Authorization Type HTA    Authorization Time Period 12/15/23 to 01/26/24    Progress Note Due on Visit 10    PT Start Time 0805    PT Stop Time 0848    PT Time Calculation (min) 43 min    Activity Tolerance Patient tolerated treatment well    Behavior During Therapy [facility] for tasks assessed/performed            Past Medical History:  Diagnosis Date   Allergy    Arthritis    all over (07/10/2013)   CAD (coronary artery disease)    a. normal cors by cath in 2016  b. 08/2016: admitted with an NSTEMI. Cath showed mild nonobstructive CAD with only 20% mid-LAD stenosis. EF was reduced to 35% and wall motion abnormalities were suggestive of a stress-induced cardiomyopathy.   Carpal tunnel syndrome of left wrist    Cerebral aneurysm, nonruptured    3-4 mm in size, stable since 2004 (most recent MRI 2014)   Colon polyp 2007   Diverticulitis    Family history of anesthesia complication    brother had PONV   Gastroesophageal reflux disease    High cholesterol    Hypertension    Insomnia    Macular degeneration 08/31/2016   PONV (postoperative nausea and vomiting)    Restless leg syndrome    Stroke [facility]) 02/2013   denies residual on 07/10/2013)   Past Surgical History:  Procedure Laterality Date   BREAST SURGERY     CHOLECYSTECTOMY     JOINT REPLACEMENT     KNEE ARTHROSCOPY Bilateral    LEFT HEART CATH AND CORONARY ANGIOGRAPHY N/A 09/01/2016   Procedure: Left Heart Cath and Coronary Angiography;  Surgeon: Darron Deatrice LABOR, MD;  Location: [facility] INVASIVE CV LAB;  Service: Cardiovascular;  Laterality: N/A;   LEFT HEART CATHETERIZATION WITH CORONARY ANGIOGRAM N/A 06/20/2014   Procedure: LEFT HEART  CATHETERIZATION WITH CORONARY ANGIOGRAM;  Surgeon: Oneil JAYSON Parchment, MD;  Location: [facility] CATH LAB;  Service: Cardiovascular;  Laterality: N/A;   TEE WITHOUT CARDIOVERSION N/A 03/14/2013   Procedure: TRANSESOPHAGEAL ECHOCARDIOGRAM (TEE);  Surgeon: Oneil Parchment, MD;  Location: [facility] ENDOSCOPY;  Service: Cardiovascular;  Laterality: N/A;   TOTAL KNEE ARTHROPLASTY Left 07/10/2013   TOTAL KNEE ARTHROPLASTY Left 07/10/2013   Procedure: TOTAL KNEE ARTHROPLASTY;  Surgeon: Maude LELON Right, MD;  Location: [facility] OR;  Service: Orthopedics;  Laterality: Left;   VAGINAL HYSTERECTOMY  2000's   Patient Active Problem List   Diagnosis Date Noted   CVA (cerebral vascular accident) (HCC) 07/05/2023   Left-sided weakness 07/04/2023   HLD (hyperlipidemia) 07/04/2023   GAD (generalized anxiety disorder) 07/04/2023   Impingement syndrome of right shoulder 10/28/2021   Carpal tunnel syndrome, bilateral 12/29/2018   Pain in right knee 12/05/2018   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   NSTEMI (non-ST elevated myocardial infarction) (HCC)    Chest pain with moderate risk of acute coronary syndrome 08/31/2016   Normal coronary arteries-2016 08/31/2016   Chest pain on exertion 06/11/2014   Osteoarthritis of left knee 07/12/2013   S/P total knee replacement using cement 07/10/2013   Aneurysm, cerebral, nonruptured 04/23/2013   History of LMCA CVA-TPA 2014 03/09/2013  Osteoarthritis 11/14/2012   Restless legs syndrome 11/14/2012   GERD (gastroesophageal reflux disease) 11/13/2012   Essential hypertension 11/05/2012   Other and unspecified hyperlipidemia 11/05/2012    PCP: Delilah Murray HERO., MD  REFERRING PROVIDER: Delilah Murray HERO., MD  REFERRING DIAG: Diagnosis R26.9 (ICD-10-CM) - Unspecified abnormalities of gait and mobility  THERAPY DIAG:  Other lack of coordination  Muscle weakness (generalized)  Unsteadiness on feet  Other abnormalities of gait and mobility  Rationale for Evaluation and Treatment:  Rehabilitation  ONSET DATE: feelings of backsliding started 2 months ago   SUBJECTIVE:   SUBJECTIVE STATEMENT: Not sore really, R knee makes a lot of noise   I just felt like I've been going backwards. Had that stroke in march and came here for PT after that, finished up in mid-May. I feel like I'm not doing as well. Have been keeping up with some of the exercises that they gave me, I just feel like I'm moving slower and R LE is giving me more trouble. It seems like its not as strong as the left. Think I'm doing pretty well otherwise.   PERTINENT HISTORY:  See above    PAIN:  Are you having pain? No  PRECAUTIONS: None  RED FLAGS: None   WEIGHT BEARING RESTRICTIONS: No  FALLS:  Has patient fallen in last 6 months? 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  DIAGNOSTIC FINDINGS:   From March 2025:  Narrative & Impression CLINICAL DATA:  83 year old female code stroke presentation. Left side deficit.   EXAM: MRI HEAD WITHOUT CONTRAST   TECHNIQUE: Multiplanar, multiecho pulse sequences of the brain and surrounding structures were obtained without intravenous contrast.   COMPARISON:  CT head, CTA and CTP today reported separately. [facility] brain MRI 06/10/2018.   FINDINGS: Brain: 11 mm wedge-shaped area of restricted diffusion in the posterior right corona radiata tracking toward the superior  right insula (series 11, image 19). Subtle T2 and FLAIR hyperintensity associated.   No other convincing diffusion restriction.   Since 2020 progressed chronic encephalomalacia and gliosis in the posterior left MCA territory. Cortical and white matter involvement there. Small chronic infarct in the inferior left cerebellum is stable. Small chronic lacunar infarcts in the bilateral deep gray nuclei appear more numerous. No chronic cerebral blood products identified on SWI. No midline shift, mass effect, evidence of mass lesion, ventriculomegaly, extra-axial collection or acute intracranial hemorrhage. Cervicomedullary junction and pituitary are within normal limits.   Vascular: Major intracranial vascular flow voids are stable since 2020.   Skull and upper cervical spine: Negative. Visualized bone marrow signal is within normal limits.   Sinuses/Orbits: Left sphenoid sinus disease redemonstrated. Mild mucosal thickening elsewhere. Postoperative changes to both globes.   Other: Mastoids well aerated. Visible scalp and face appear within normal limits.   IMPRESSION: 1. Positive for a small 11 mm Acute Infarct in the posterior right corona radiata tracking toward the insula. No associated hemorrhage  or mass effect. 2. Progressed chronic ischemic disease since a 2020 MRI, which is otherwise most pronounced in the posterior left MCA territory.      PATIENT SURVEYS:  PSFS: THE PATIENT SPECIFIC FUNCTIONAL SCALE  Place score of 0-10 (0 = unable to perform activity and 10 = able to perform activity at the same level as before injury or problem)  Activity Date: 12/15/23 eval     Going up and down steps  6    2.      3.     4.      Total Score 6      Total Score = Sum of activity scores/number of activities  Minimally Detectable Change: 3 points (for single activity); 2 points (for average score)  [facility] (nd). The Patient Specific Functional Scale .  Retrieved from [email or web address]   COGNITION: Overall cognitive status: Within functional limits for tasks assessed     SENSATION: Not tested     LOWER EXTREMITY MMT:  MMT Right eval Left eval  Hip flexion 3 4  Hip extension    Hip abduction 3 3  Hip adduction    Hip internal rotation    Hip external rotation    Knee flexion 3+ 4  Knee extension 3+ 4+  Ankle dorsiflexion 5 5  Ankle plantarflexion    Ankle inversion    Ankle eversion     (Blank rows = not tested)    FUNCTIONAL TESTS:  5 times sit to stand: 13 seconds hands on thighs  3 minute walk test: 457ft with SPC             DGI 14/24  GAIT: Distance walked: 468ft Assistive device utilized: Single point cane Level of assistance: Modified independence Comments: slow but steady with SPC                                                                                                                                 TREATMENT DATE:  12/26/23:  Nustep L 5 x 7 min Standing for single leg slides with washcloth on ground, and L UE support Hamstring curls 25# 2 x 10 Knee ext 10#, 2 x 10 In ll bars for penguin with black t band around thighs, to fatigue Forward lunges foot in center of compliant surface BOSU, 15 x each in ll bars  Sit to stand from elevated table with forward punches with mediball, 2 # 10 reps Standing for tandem 30 sec holds each Seated hip add 5 sec holds with ball 10x Standing on airex for heel/ toe rocks B Forward step ups onto airex, alternating   12/21/23: Sit to stand from elevated mat 10 x, with forward punches with red mediball In ll bars for forward lunges on compliant side of BOSU, 15x each leg In ll bars for heel /toe rocks on airex, mass practice In ll bars for alt hip abd with 1 1/2# cuff wts  on each leg mass practice Hamstring curls 25# 2 x 10 Knee ext 10#, 2 x 10 Nustep L 6 x 6 min 30 sec  Standing with rail for support,  semi tandem, up to 30 sec holds, tends to lose balance to L Slides towel on floor, for/back, side/side emphasis on maintaining upright hip position, avoiding L lateral hip lurch   12/20/23 NuStep L5 x 6 min HS curls 20lb x10, 25lb x10  RLE 15lb x10  Leg Ext 10lb 2x10  RLE 5lb x10 Sit to stands 2x10 UE on knees  Hip add ball squeeze Hip abd blue 2x10 4in step ups 1 rail x10 each  Cues for sequencing Heel raises from floor 2x10   12/15/23   Exam findings, POC, education as below and HEP practice/discussion of new exercises as appropriate    PATIENT EDUCATION:  Education details: exam findings, POC, HEP updates, possible transitions to gym based program at [employer]) to help prevent another backslide  Person educated: Patient Education method: Programmer, multimedia, Facilities manager, Verbal cues, and Handouts Education comprehension: verbalized understanding, returned demonstration, and needs further education  HOME EXERCISE PROGRAM:  Access Code: [email or web address] URL: [email or web address] Date: 12/15/2023 Prepared by: Josette Rough  Exercises - Standing Hip Abduction with Counter Support  - 1 x daily - 7 x weekly - 2 sets - 10 reps - Mini Squat with Counter Support  - 1 x daily - 7 x weekly - 2 sets - 10 reps - Standing March with Counter Support  - 1 x daily - 7 x weekly - 2 sets - 10 reps - Standing Tandem Balance with Counter Support  - 1 x daily - 7 x weekly - 3 reps - 30 hold - Standing Single Leg Stance with Counter Support  - 1 x daily - 7 x weekly - 5 reps - 10 hold - Supine Bridge  - 1 x daily - 7 x weekly - 1-2 sets - 10 reps - Sit to Stand with Resistance Around Legs  - 1 x daily - 7 x weekly - 1-2 sets - 10 reps - Tandem Walking with Counter Support  - 1 x daily - 7 x weekly - 1 sets - 5 reps  ASSESSMENT:  CLINICAL IMPRESSION:  Patient is a 83 y.o. F who participated  today with physical therapy treatment for Diagnosis R26.9 (ICD-10-CM) - Unspecified abnormalities  of gait and mobility. She enters doing well with continued report of RLE knee instability, and L LE weakness. Today we addressed a combination of standing balance and stability training, as well as functional lower extremity strengthening.  She tolerated well.   Will benefit from skilled PT interventions with potential transition to [facility] PREP program at [place].   OBJECTIVE IMPAIRMENTS: Abnormal gait, decreased activity tolerance, decreased balance, decreased knowledge of use of DME, decreased mobility, difficulty walking, and decreased strength.   ACTIVITY LIMITATIONS: standing, squatting, stairs, transfers, and locomotion level  PARTICIPATION LIMITATIONS: meal prep, cleaning, laundry, driving, shopping, community activity, occupation, and yard work  PERSONAL FACTORS: Age, Behavior pattern, Education, Fitness, Past/current experiences, Profession, Sex, Social background, and Time since onset of injury/illness/exacerbation are also affecting patient's functional outcome.   REHAB POTENTIAL: Good  CLINICAL DECISION MAKING: Stable/uncomplicated  EVALUATION COMPLEXITY: Low   GOALS: Goals reviewed with patient? No  SHORT TERM GOALS: Target date: 01/05/2024   Will be compliant with appropriate progressive HEP  Baseline: Goal status: 12/20/23 Met  LONG TERM GOALS: Target date: 01/26/2024      MMT to have improved  by one grade in all weak groups  Baseline:  Goal status: INITIAL  2.  Will score at least 20 on DGI to show reduced fall risk  Baseline:  Goal status: INITIAL  3.  Will ambulate at least 587ft in with no device in order to show improved functional activity tolerance  Baseline:  Goal status: INITIAL  4.  Will be able to ascend/descend steps no rails without knee buckling to demonstrate improved eccentric strength/motor control  Baseline:  Goal status: INITIAL  5.  Will be compliant with appropriate gym based exercise program (transition to [facility] PREP if she is  interested) Baseline:  Goal status: INITIAL     PLAN:  PT FREQUENCY: 2x/week  PT DURATION: 6 weeks  PLANNED INTERVENTIONS: 97750- Physical Performance Testing, 97110-Therapeutic exercises, 97530- Therapeutic activity, [id number]- Neuromuscular re-education, 97535- Self Care, [id number]- Manual therapy, and [id number]- Gait training  PLAN FOR NEXT SESSION: conditioning, strength, balance, PRE   Mischa Pollard, PT, DPT, OCS 12/26/23 1:37 PM

## 2023-12-28 ENCOUNTER — Encounter: Payer: Self-pay | Admitting: Physical Therapy

## 2023-12-28 ENCOUNTER — Ambulatory Visit: Admitting: Physical Therapy

## 2023-12-28 DIAGNOSIS — R2681 Unsteadiness on feet: Secondary | ICD-10-CM

## 2023-12-28 DIAGNOSIS — R278 Other lack of coordination: Secondary | ICD-10-CM | POA: Diagnosis not present

## 2023-12-28 DIAGNOSIS — M6281 Muscle weakness (generalized): Secondary | ICD-10-CM

## 2023-12-28 NOTE — Therapy (Signed)
 OUTPATIENT PHYSICAL THERAPY TREATMENT   Patient Name: Aimee Hood MRN: [id number] DOB:25-Sep-1940, 83 y.o., female Today's Date: 12/28/2023  END OF SESSION:  PT End of Session - 12/28/23 0756     Visit Number 5    Date for PT Re-Evaluation 01/26/24    PT Start Time 0756    PT Stop Time 0841    PT Time Calculation (min) 45 min    Activity Tolerance Patient tolerated treatment well    Behavior During Therapy [facility] for tasks assessed/performed            Past Medical History:  Diagnosis Date   Allergy    Arthritis    all over (07/10/2013)   CAD (coronary artery disease)    a. normal cors by cath in 2016  b. 08/2016: admitted with an NSTEMI. Cath showed mild nonobstructive CAD with only 20% mid-LAD stenosis. EF was reduced to 35% and wall motion abnormalities were suggestive of a stress-induced cardiomyopathy.   Carpal tunnel syndrome of left wrist    Cerebral aneurysm, nonruptured    3-4 mm in size, stable since 2004 (most recent MRI 2014)   Colon polyp 2007   Diverticulitis    Family history of anesthesia complication    brother had PONV   Gastroesophageal reflux disease    High cholesterol    Hypertension    Insomnia    Macular degeneration 08/31/2016   PONV (postoperative nausea and vomiting)    Restless leg syndrome    Stroke [facility]) 02/2013   denies residual on 07/10/2013)   Past Surgical History:  Procedure Laterality Date   BREAST SURGERY     CHOLECYSTECTOMY     JOINT REPLACEMENT     KNEE ARTHROSCOPY Bilateral    LEFT HEART CATH AND CORONARY ANGIOGRAPHY N/A 09/01/2016   Procedure: Left Heart Cath and Coronary Angiography;  Surgeon: Darron Deatrice LABOR, MD;  Location: [facility] INVASIVE CV LAB;  Service: Cardiovascular;  Laterality: N/A;   LEFT HEART CATHETERIZATION WITH CORONARY ANGIOGRAM N/A 06/20/2014   Procedure: LEFT HEART CATHETERIZATION WITH CORONARY ANGIOGRAM;  Surgeon: Oneil JAYSON Parchment, MD;  Location: [facility] CATH LAB;  Service: Cardiovascular;  Laterality: N/A;   TEE  WITHOUT CARDIOVERSION N/A 03/14/2013   Procedure: TRANSESOPHAGEAL ECHOCARDIOGRAM (TEE);  Surgeon: Oneil Parchment, MD;  Location: [facility] ENDOSCOPY;  Service: Cardiovascular;  Laterality: N/A;   TOTAL KNEE ARTHROPLASTY Left 07/10/2013   TOTAL KNEE ARTHROPLASTY Left 07/10/2013   Procedure: TOTAL KNEE ARTHROPLASTY;  Surgeon: Maude LELON Right, MD;  Location: [facility] OR;  Service: Orthopedics;  Laterality: Left;   VAGINAL HYSTERECTOMY  2000's   Patient Active Problem List   Diagnosis Date Noted   CVA (cerebral vascular accident) (HCC) 07/05/2023   Left-sided weakness 07/04/2023   HLD (hyperlipidemia) 07/04/2023   GAD (generalized anxiety disorder) 07/04/2023   Impingement syndrome of right shoulder 10/28/2021   Carpal tunnel syndrome, bilateral 12/29/2018   Pain in right knee 12/05/2018   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   NSTEMI (non-ST elevated myocardial infarction) (HCC)    Chest pain with moderate risk of acute coronary syndrome 08/31/2016   Normal coronary arteries-2016 08/31/2016   Chest pain on exertion 06/11/2014   Osteoarthritis of left knee 07/12/2013   S/P total knee replacement using cement 07/10/2013   Aneurysm, cerebral, nonruptured 04/23/2013   History of LMCA CVA-TPA 2014 03/09/2013   Osteoarthritis 11/14/2012   Restless legs syndrome 11/14/2012   GERD (gastroesophageal reflux disease) 11/13/2012   Essential hypertension 11/05/2012   Other and unspecified hyperlipidemia 11/05/2012  PCP: Delilah Murray HERO., MD  REFERRING PROVIDER: Delilah Murray HERO., MD  REFERRING DIAG: Diagnosis R26.9 (ICD-10-CM) - Unspecified abnormalities of gait and mobility  THERAPY DIAG:  Other lack of coordination  Muscle weakness (generalized)  Unsteadiness on feet  Rationale for Evaluation and Treatment: Rehabilitation  ONSET DATE: feelings of backsliding started 2 months ago   SUBJECTIVE:   SUBJECTIVE STATEMENT: Im all right R leg feel like it is going to give way  I just felt like I've  been going backwards. Had that stroke in march and came here for PT after that, finished up in mid-May. I feel like I'm not doing as well. Have been keeping up with some of the exercises that they gave me, I just feel like I'm moving slower and R LE is giving me more trouble. It seems like its not as strong as the left. Think I'm doing pretty well otherwise.   PERTINENT HISTORY:  See above    PAIN:  Are you having pain? No  PRECAUTIONS: None  RED FLAGS: None   WEIGHT BEARING RESTRICTIONS: No  FALLS:  Has patient fallen in last 6 months? 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  DIAGNOSTIC FINDINGS:   From March 2025:  Narrative & Impression CLINICAL DATA:  83 year old female code stroke presentation. Left side deficit.   EXAM: MRI HEAD WITHOUT CONTRAST   TECHNIQUE: Multiplanar, multiecho pulse sequences of the brain and surrounding structures were obtained without intravenous contrast.   COMPARISON:  CT head, CTA and CTP today reported separately. [facility] brain MRI 06/10/2018.   FINDINGS: Brain: 11 mm wedge-shaped area of restricted diffusion in the posterior right corona radiata tracking toward the superior right insula (series 11, image 19). Subtle T2 and FLAIR hyperintensity associated.   No other convincing diffusion restriction.   Since 2020 progressed chronic encephalomalacia and  gliosis in the posterior left MCA territory. Cortical and white matter involvement there. Small chronic infarct in the inferior left cerebellum is stable. Small chronic lacunar infarcts in the bilateral deep gray nuclei appear more numerous. No chronic cerebral blood products identified on SWI. No midline shift, mass effect, evidence of mass lesion, ventriculomegaly, extra-axial collection or acute intracranial hemorrhage. Cervicomedullary junction and pituitary are within normal limits.   Vascular: Major intracranial vascular flow voids are stable since 2020.   Skull and upper cervical spine: Negative. Visualized bone marrow signal is within normal limits.   Sinuses/Orbits: Left sphenoid sinus disease redemonstrated. Mild mucosal thickening elsewhere. Postoperative changes to both globes.   Other: Mastoids well aerated. Visible scalp and face appear within normal limits.   IMPRESSION: 1. Positive for a small 11 mm Acute Infarct in the posterior right corona radiata tracking toward the insula. No associated hemorrhage or mass effect. 2. Progressed chronic ischemic disease since a 2020 MRI, which is otherwise most pronounced in the posterior left MCA territory.      PATIENT SURVEYS:  PSFS: THE PATIENT SPECIFIC  FUNCTIONAL SCALE  Place score of 0-10 (0 = unable to perform activity and 10 = able to perform activity at the same level as before injury or problem)  Activity Date: 12/15/23 eval     Going up and down steps  6    2.      3.     4.      Total Score 6      Total Score = Sum of activity scores/number of activities  Minimally Detectable Change: 3 points (for single activity); 2 points (for average score)  [facility] (nd). The Patient Specific Functional Scale . Retrieved from [email or web address]   COGNITION: Overall cognitive status: Within functional limits for tasks assessed     SENSATION: Not  tested     LOWER EXTREMITY MMT:  MMT Right eval Left eval  Hip flexion 3 4  Hip extension    Hip abduction 3 3  Hip adduction    Hip internal rotation    Hip external rotation    Knee flexion 3+ 4  Knee extension 3+ 4+  Ankle dorsiflexion 5 5  Ankle plantarflexion    Ankle inversion    Ankle eversion     (Blank rows = not tested)    FUNCTIONAL TESTS:  5 times sit to stand: 13 seconds hands on thighs  3 minute walk test: 43ft with SPC             DGI 14/24  GAIT: Distance walked: 470ft Assistive device utilized: Single point cane Level of assistance: Modified independence Comments: slow but steady with SPC                                                                                                                                 TREATMENT DATE:  12/28/23 Three laps around the from [place] in the back of building. Fatigue and slight instability towards the end Sit to stand holding yellow ball 2x10 Resisted gait 30lb 4 way x 3 each HS curls 25lb 2x12 Leg Ext 10lb 2x12 Alt 6in box taps x10  On airex x5    12/26/23:  Nustep L 5 x 7 min Standing for single leg slides with washcloth on ground, and L UE support Hamstring curls 25# 2 x 10 Knee ext 10#, 2 x 10 In ll bars for penguin with black t band around thighs, to fatigue Forward lunges foot in center of compliant surface BOSU, 15 x each in ll bars  Sit to stand from elevated table with forward punches with mediball, 2 # 10 reps Standing for tandem 30 sec holds each Seated hip add 5 sec holds with ball 10x Standing on airex for heel/ toe rocks B Forward step ups onto airex, alternating   12/21/23: Sit to stand from elevated mat 10 x, with forward punches with red mediball In ll bars for forward lunges on compliant side of BOSU, 15x each leg In ll bars for  heel /toe rocks on airex, mass practice In ll bars for alt hip abd with 1 1/2# cuff wts on each leg mass practice Hamstring curls 25# 2 x 10 Knee ext  10#, 2 x 10 Nustep L 6 x 6 min 30 sec  Standing with rail for support, semi tandem, up to 30 sec holds, tends to lose balance to L Slides towel on floor, for/back, side/side emphasis on maintaining upright hip position, avoiding L lateral hip lurch   12/20/23 NuStep L5 x 6 min HS curls 20lb x10, 25lb x10  RLE 15lb x10  Leg Ext 10lb 2x10  RLE 5lb x10 Sit to stands 2x10 UE on knees  Hip add ball squeeze Hip abd blue 2x10 4in step ups 1 rail x10 each  Cues for sequencing Heel raises from floor 2x10   12/15/23   Exam findings, POC, education as below and HEP practice/discussion of new exercises as appropriate    PATIENT EDUCATION:  Education details: exam findings, POC, HEP updates, possible transitions to gym based program at [employer]) to help prevent another backslide  Person educated: Patient Education method: Programmer, multimedia, Facilities manager, Verbal cues, and Handouts Education comprehension: verbalized understanding, returned demonstration, and needs further education  HOME EXERCISE PROGRAM:  Access Code: [email or web address] URL: [email or web address] Date: 12/15/2023 Prepared by: Josette Rough  Exercises - Standing Hip Abduction with Counter Support  - 1 x daily - 7 x weekly - 2 sets - 10 reps - Mini Squat with Counter Support  - 1 x daily - 7 x weekly - 2 sets - 10 reps - Standing March with Counter Support  - 1 x daily - 7 x weekly - 2 sets - 10 reps - Standing Tandem Balance with Counter Support  - 1 x daily - 7 x weekly - 3 reps - 30 hold - Standing Single Leg Stance with Counter Support  - 1 x daily - 7 x weekly - 5 reps - 10 hold - Supine Bridge  - 1 x daily - 7 x weekly - 1-2 sets - 10 reps - Sit to Stand with Resistance Around Legs  - 1 x daily - 7 x weekly - 1-2 sets - 10 reps - Tandem Walking with Counter Support  - 1 x daily - 7 x weekly - 1 sets - 5 reps  ASSESSMENT:  CLINICAL IMPRESSION:  Patient is a 83 y.o. F who participated  today with physical  therapy treatment for Diagnosis R26.9 (ICD-10-CM) - Unspecified abnormalities of gait and mobility. She enters doing well with continued report of RLE feeling likr it is going to give way.  She reports no occurances of it  giving out on her. Today we addressed a combination of functional strength and endurance.  Some instability with gait as she fatigues. Cues to control decents with sit to stands. Difficult time with alt box taps on airex..  She tolerated well.   Will benefit from skilled PT interventions with potential transition to [facility] PREP program at [place].   OBJECTIVE IMPAIRMENTS: Abnormal gait, decreased activity tolerance, decreased balance, decreased knowledge of use of DME, decreased mobility, difficulty walking, and decreased strength.   ACTIVITY LIMITATIONS: standing, squatting, stairs, transfers, and locomotion level  PARTICIPATION LIMITATIONS: meal prep, cleaning, laundry, driving, shopping, community activity, occupation, and yard work  PERSONAL FACTORS: Age, Behavior pattern, Education, Fitness, Past/current experiences, Profession, Sex, Social background, and Time since onset of injury/illness/exacerbation are also affecting patient's functional outcome.   REHAB POTENTIAL: Good  CLINICAL DECISION MAKING: Stable/uncomplicated  EVALUATION COMPLEXITY: Low   GOALS: Goals reviewed with patient? No  SHORT TERM GOALS: Target date: 01/05/2024   Will be compliant with appropriate progressive HEP  Baseline: Goal status: 12/20/23 Met  LONG TERM GOALS: Target date: 01/26/2024      MMT to have improved by one grade in all weak groups  Baseline:  Goal status: INITIAL  2.  Will score at least 20 on DGI to show reduced fall risk  Baseline:  Goal status: INITIAL  3.  Will ambulate at least 59ft in with no device in order to show improved functional activity tolerance  Baseline:  Goal status: Progressing 12/28/23  4.  Will be able to ascend/descend steps no rails  without knee buckling to demonstrate improved eccentric strength/motor control  Baseline:  Goal status: Progressing 12/28/23  5.  Will be compliant with appropriate gym based exercise program (transition to [facility] PREP if she is interested) Baseline:  Goal status: INITIAL     PLAN:  PT FREQUENCY: 2x/week  PT DURATION: 6 weeks  PLANNED INTERVENTIONS: 97750- Physical Performance Testing, 97110-Therapeutic exercises, 97530- Therapeutic activity, [id number]- Neuromuscular re-education, 97535- Self Care, [id number]- Manual therapy, and [id number]- Gait training  PLAN FOR NEXT SESSION: conditioning, strength, balance, PRE   Tanda Sorrow, PTA 12/28/23 7:57 AM

## 2024-01-02 ENCOUNTER — Encounter: Payer: Self-pay | Admitting: Physical Therapy

## 2024-01-02 ENCOUNTER — Ambulatory Visit: Admitting: Physical Therapy

## 2024-01-02 DIAGNOSIS — M6281 Muscle weakness (generalized): Secondary | ICD-10-CM

## 2024-01-02 DIAGNOSIS — R278 Other lack of coordination: Secondary | ICD-10-CM

## 2024-01-02 DIAGNOSIS — R2681 Unsteadiness on feet: Secondary | ICD-10-CM

## 2024-01-02 NOTE — Therapy (Signed)
 OUTPATIENT PHYSICAL THERAPY TREATMENT   Patient Name: Aimee Hood MRN: [id number] DOB:[birth year], 83 y.o., female Today's Date: 01/02/2024  END OF SESSION:  PT End of Session - 01/02/24 0759     Visit Number 6    Date for Recertification  01/26/24    PT Start Time 0759    PT Stop Time 0844    PT Time Calculation (min) 45 min    Activity Tolerance Patient tolerated treatment well    Behavior During Therapy [facility] for tasks assessed/performed            Past Medical History:  Diagnosis Date   Allergy    Arthritis    all over (07/10/2013)   CAD (coronary artery disease)    a. normal cors by cath in 2016  b. 08/2016: admitted with an NSTEMI. Cath showed mild nonobstructive CAD with only 20% mid-LAD stenosis. EF was reduced to 35% and wall motion abnormalities were suggestive of a stress-induced cardiomyopathy.   Carpal tunnel syndrome of left wrist    Cerebral aneurysm, nonruptured    3-4 mm in size, stable since 2004 (most recent MRI 2014)   Colon polyp 2007   Diverticulitis    Family history of anesthesia complication    brother had PONV   Gastroesophageal reflux disease    High cholesterol    Hypertension    Insomnia    Macular degeneration 08/31/2016   PONV (postoperative nausea and vomiting)    Restless leg syndrome    Stroke [facility]) 02/2013   denies residual on 07/10/2013)   Past Surgical History:  Procedure Laterality Date   BREAST SURGERY     CHOLECYSTECTOMY     JOINT REPLACEMENT     KNEE ARTHROSCOPY Bilateral    LEFT HEART CATH AND CORONARY ANGIOGRAPHY N/A 09/01/2016   Procedure: Left Heart Cath and Coronary Angiography;  Surgeon: Darron Deatrice LABOR, MD;  Location: [facility] INVASIVE CV LAB;  Service: Cardiovascular;  Laterality: N/A;   LEFT HEART CATHETERIZATION WITH CORONARY ANGIOGRAM N/A 06/20/2014   Procedure: LEFT HEART CATHETERIZATION WITH CORONARY ANGIOGRAM;  Surgeon: Oneil JAYSON Parchment, MD;  Location: [facility] CATH LAB;  Service: Cardiovascular;  Laterality: N/A;   TEE  WITHOUT CARDIOVERSION N/A 03/14/2013   Procedure: TRANSESOPHAGEAL ECHOCARDIOGRAM (TEE);  Surgeon: Oneil Parchment, MD;  Location: [facility] ENDOSCOPY;  Service: Cardiovascular;  Laterality: N/A;   TOTAL KNEE ARTHROPLASTY Left 07/10/2013   TOTAL KNEE ARTHROPLASTY Left 07/10/2013   Procedure: TOTAL KNEE ARTHROPLASTY;  Surgeon: Maude LELON Right, MD;  Location: [facility] OR;  Service: Orthopedics;  Laterality: Left;   VAGINAL HYSTERECTOMY  2000's   Patient Active Problem List   Diagnosis Date Noted   CVA (cerebral vascular accident) (HCC) 07/05/2023   Left-sided weakness 07/04/2023   HLD (hyperlipidemia) 07/04/2023   GAD (generalized anxiety disorder) 07/04/2023   Impingement syndrome of right shoulder 10/28/2021   Carpal tunnel syndrome, bilateral 12/29/2018   Pain in right knee 12/05/2018   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   NSTEMI (non-ST elevated myocardial infarction) (HCC)    Chest pain with moderate risk of acute coronary syndrome 08/31/2016   Normal coronary arteries-2016 08/31/2016   Chest pain on exertion 06/11/2014   Osteoarthritis of left knee 07/12/2013   S/P total knee replacement using cement 07/10/2013   Aneurysm, cerebral, nonruptured 04/23/2013   History of LMCA CVA-TPA 2014 03/09/2013   Osteoarthritis 11/14/2012   Restless legs syndrome 11/14/2012   GERD (gastroesophageal reflux disease) 11/13/2012   Essential hypertension 11/05/2012   Other and unspecified hyperlipidemia 11/05/2012  PCP: Delilah Murray HERO., MD  REFERRING PROVIDER: Delilah Murray HERO., MD  REFERRING DIAG: Diagnosis R26.9 (ICD-10-CM) - Unspecified abnormalities of gait and mobility  THERAPY DIAG:  Other lack of coordination  Muscle weakness (generalized)  Unsteadiness on feet  Rationale for Evaluation and Treatment: Rehabilitation  ONSET DATE: feelings of backsliding started 2 months ago   SUBJECTIVE:   SUBJECTIVE STATEMENT: I am doing good Can feel her R leg but it feel like it is getting better  I  just felt like I've been going backwards. Had that stroke in march and came here for PT after that, finished up in mid-May. I feel like I'm not doing as well. Have been keeping up with some of the exercises that they gave me, I just feel like I'm moving slower and R LE is giving me more trouble. It seems like its not as strong as the left. Think I'm doing pretty well otherwise.   PERTINENT HISTORY:  See above    PAIN:  Are you having pain? No  PRECAUTIONS: None  RED FLAGS: None   WEIGHT BEARING RESTRICTIONS: No  FALLS:  Has patient fallen in last 6 months? 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  DIAGNOSTIC FINDINGS:   From March 2025:  Narrative & Impression CLINICAL DATA:  83 year old female code stroke presentation. Left side deficit.   EXAM: MRI HEAD WITHOUT CONTRAST   TECHNIQUE: Multiplanar, multiecho pulse sequences of the brain and surrounding structures were obtained without intravenous contrast.   COMPARISON:  CT head, CTA and CTP today reported separately. [facility] brain MRI 06/10/2018.   FINDINGS: Brain: 11 mm wedge-shaped area of restricted diffusion in the posterior right corona radiata tracking toward the superior right insula (series 11, image 19). Subtle T2 and FLAIR hyperintensity associated.   No other convincing diffusion restriction.   Since 2020 progressed chronic  encephalomalacia and gliosis in the posterior left MCA territory. Cortical and white matter involvement there. Small chronic infarct in the inferior left cerebellum is stable. Small chronic lacunar infarcts in the bilateral deep gray nuclei appear more numerous. No chronic cerebral blood products identified on SWI. No midline shift, mass effect, evidence of mass lesion, ventriculomegaly, extra-axial collection or acute intracranial hemorrhage. Cervicomedullary junction and pituitary are within normal limits.   Vascular: Major intracranial vascular flow voids are stable since 2020.   Skull and upper cervical spine: Negative. Visualized bone marrow signal is within normal limits.   Sinuses/Orbits: Left sphenoid sinus disease redemonstrated. Mild mucosal thickening elsewhere. Postoperative changes to both globes.   Other: Mastoids well aerated. Visible scalp and face appear within normal limits.   IMPRESSION: 1. Positive for a small 11 mm Acute Infarct in the posterior right corona radiata tracking toward the insula. No associated hemorrhage or mass effect. 2. Progressed chronic ischemic disease since a 2020 MRI, which is otherwise most pronounced in the posterior left MCA territory.      PATIENT SURVEYS:  PSFS: THE PATIENT SPECIFIC FUNCTIONAL SCALE  Place score of 0-10 (0 = unable to perform activity and 10 = able to perform activity at the same level as before injury or problem)  Activity Date: 12/15/23 eval     Going up and down steps  6    2.      3.     4.      Total Score 6      Total Score = Sum of activity scores/number of activities  Minimally Detectable Change: 3 points (for single activity); 2 points (for average score)  [facility] (nd). The Patient Specific Functional Scale . Retrieved from [email or web address]   COGNITION: Overall cognitive status: Within functional limits for tasks  assessed     SENSATION: Not tested     LOWER EXTREMITY MMT:  MMT Right eval Left eval  Hip flexion 3 4  Hip extension    Hip abduction 3 3  Hip adduction    Hip internal rotation    Hip external rotation    Knee flexion 3+ 4  Knee extension 3+ 4+  Ankle dorsiflexion 5 5  Ankle plantarflexion    Ankle inversion    Ankle eversion     (Blank rows = not tested)    FUNCTIONAL TESTS:  5 times sit to stand: 13 seconds hands on thighs  3 minute walk test: 487ft with SPC             DGI 14/24  GAIT: Distance walked: 462ft Assistive device utilized: Single point cane Level of assistance: Modified independence Comments: slow but steady with SPC                                                                                                                                 TREATMENT DATE:  01/02/24 Gait around back building in parking lot, felt a pull in the front and the back pf her RLE with uphill ambulation HS curls 25lb 2x15 Leg Ext 10lb 2x15 4in forward & (lateral HHA x1) step ups x10 each Sit to stand holding yellow ball 2x10 RLE LAQ 5lb 2x10 RLE HS curls 2x15   12/28/23 Three laps around the from [place] in the back of building. Fatigue and slight instability towards the end Sit to stand holding yellow ball 2x10 Resisted gait 30lb 4 way x 3 each HS curls 25lb 2x12 Leg Ext 10lb 2x12 Alt 6in box taps x10  On airex x5    12/26/23:  Nustep L 5 x 7 min Standing for single leg slides with washcloth on ground, and L UE support Hamstring curls 25# 2 x 10 Knee ext 10#, 2 x 10 In ll bars for penguin with black t band around thighs, to fatigue Forward lunges foot in center of compliant surface BOSU, 15 x each in ll bars  Sit to stand from elevated table with forward punches with mediball, 2 # 10 reps Standing for tandem  30 sec holds each Seated hip add 5 sec holds with ball 10x Standing on airex for heel/ toe rocks B Forward step ups onto airex,  alternating   12/21/23: Sit to stand from elevated mat 10 x, with forward punches with red mediball In ll bars for forward lunges on compliant side of BOSU, 15x each leg In ll bars for heel /toe rocks on airex, mass practice In ll bars for alt hip abd with 1 1/2# cuff wts on each leg mass practice Hamstring curls 25# 2 x 10 Knee ext 10#, 2 x 10 Nustep L 6 x 6 min 30 sec  Standing with rail for support, semi tandem, up to 30 sec holds, tends to lose balance to L Slides towel on floor, for/back, side/side emphasis on maintaining upright hip position, avoiding L lateral hip lurch   12/20/23 NuStep L5 x 6 min HS curls 20lb x10, 25lb x10  RLE 15lb x10  Leg Ext 10lb 2x10  RLE 5lb x10 Sit to stands 2x10 UE on knees  Hip add ball squeeze Hip abd blue 2x10 4in step ups 1 rail x10 each  Cues for sequencing Heel raises from floor 2x10   12/15/23   Exam findings, POC, education as below and HEP practice/discussion of new exercises as appropriate    PATIENT EDUCATION:  Education details: exam findings, POC, HEP updates, possible transitions to gym based program at [employer]) to help prevent another backslide  Person educated: Patient Education method: Programmer, multimedia, Facilities manager, Verbal cues, and Handouts Education comprehension: verbalized understanding, returned demonstration, and needs further education  HOME EXERCISE PROGRAM:  Access Code: [email or web address] URL: [email or web address] Date: 12/15/2023 Prepared by: Josette Rough  Exercises - Standing Hip Abduction with Counter Support  - 1 x daily - 7 x weekly - 2 sets - 10 reps - Mini Squat with Counter Support  - 1 x daily - 7 x weekly - 2 sets - 10 reps - Standing March with Counter Support  - 1 x daily - 7 x weekly - 2 sets - 10 reps - Standing Tandem Balance with Counter Support  - 1 x daily - 7 x weekly - 3 reps - 30 hold - Standing Single Leg Stance with Counter Support  - 1 x daily - 7 x weekly - 5 reps - 10 hold -  Supine Bridge  - 1 x daily - 7 x weekly - 1-2 sets - 10 reps - Sit to Stand with Resistance Around Legs  - 1 x daily - 7 x weekly - 1-2 sets - 10 reps - Tandem Walking with Counter Support  - 1 x daily - 7 x weekly - 1 sets - 5 reps  ASSESSMENT:  CLINICAL IMPRESSION:  Patient is a 83 y.o. F who participated  today with physical therapy treatment for Diagnosis R26.9 (ICD-10-CM) - Unspecified abnormalities of gait and mobility. Today we continued to address a combination of functional strength and endurance. Increase her outdoor gait distance  Some fatigue with uphill ambulation. S one instability with step ups requiring CGA.  All interventions completed well.    Will benefit from skilled PT interventions with potential transition to [facility] PREP program at [place].   OBJECTIVE IMPAIRMENTS: Abnormal gait, decreased activity tolerance, decreased balance, decreased knowledge of use of DME, decreased mobility, difficulty walking, and decreased strength.   ACTIVITY LIMITATIONS: standing, squatting, stairs, transfers, and locomotion level  PARTICIPATION LIMITATIONS: meal prep, cleaning, laundry, driving, shopping, community activity, occupation, and yard work  PERSONAL FACTORS: Age, Behavior  pattern, Education, Fitness, Past/current experiences, Profession, Sex, Social background, and Time since onset of injury/illness/exacerbation are also affecting patient's functional outcome.   REHAB POTENTIAL: Good  CLINICAL DECISION MAKING: Stable/uncomplicated  EVALUATION COMPLEXITY: Low   GOALS: Goals reviewed with patient? No  SHORT TERM GOALS: Target date: 01/05/2024   Will be compliant with appropriate progressive HEP  Baseline: Goal status: 12/20/23 Met  LONG TERM GOALS: Target date: 01/26/2024      MMT to have improved by one grade in all weak groups  Baseline:  Goal status: INITIAL  2.  Will score at least 20 on DGI to show reduced fall risk  Baseline:  Goal status: INITIAL  3.  Will  ambulate at least 526ft in with no device in order to show improved functional activity tolerance  Baseline:  Goal status: Progressing 12/28/23  4.  Will be able to ascend/descend steps no rails without knee buckling to demonstrate improved eccentric strength/motor control  Baseline:  Goal status: Progressing 12/28/23  5.  Will be compliant with appropriate gym based exercise program (transition to [facility] PREP if she is interested) Baseline:  Goal status: INITIAL     PLAN:  PT FREQUENCY: 2x/week  PT DURATION: 6 weeks  PLANNED INTERVENTIONS: 97750- Physical Performance Testing, 97110-Therapeutic exercises, 97530- Therapeutic activity, [id number]- Neuromuscular re-education, 97535- Self Care, [id number]- Manual therapy, and [id number]- Gait training  PLAN FOR NEXT SESSION: conditioning, strength, balance, PRE   Tanda Sorrow, PTA 01/02/24 8:00 AM

## 2024-01-05 NOTE — Therapy (Signed)
 OUTPATIENT PHYSICAL THERAPY TREATMENT   Patient Name: Aimee Hood MRN: [id number] DOB:January 16, 1941, 83 y.o., female Today's Date: 01/06/2024  END OF SESSION:  PT End of Session - 01/06/24 0756     Visit Number 7    Date for Recertification  01/26/24    PT Start Time 0758    PT Stop Time 0845    PT Time Calculation (min) 47 min    Activity Tolerance Patient tolerated treatment well    Behavior During Therapy [facility] for tasks assessed/performed             Past Medical History:  Diagnosis Date   Allergy    Arthritis    all over (07/10/2013)   CAD (coronary artery disease)    a. normal cors by cath in 2016  b. 08/2016: admitted with an NSTEMI. Cath showed mild nonobstructive CAD with only 20% mid-LAD stenosis. EF was reduced to 35% and wall motion abnormalities were suggestive of a stress-induced cardiomyopathy.   Carpal tunnel syndrome of left wrist    Cerebral aneurysm, nonruptured    3-4 mm in size, stable since 2004 (most recent MRI 2014)   Colon polyp 2007   Diverticulitis    Family history of anesthesia complication    brother had PONV   Gastroesophageal reflux disease    High cholesterol    Hypertension    Insomnia    Macular degeneration 08/31/2016   PONV (postoperative nausea and vomiting)    Restless leg syndrome    Stroke [facility]) 02/2013   denies residual on 07/10/2013)   Past Surgical History:  Procedure Laterality Date   BREAST SURGERY     CHOLECYSTECTOMY     JOINT REPLACEMENT     KNEE ARTHROSCOPY Bilateral    LEFT HEART CATH AND CORONARY ANGIOGRAPHY N/A 09/01/2016   Procedure: Left Heart Cath and Coronary Angiography;  Surgeon: Darron Deatrice LABOR, MD;  Location: [facility] INVASIVE CV LAB;  Service: Cardiovascular;  Laterality: N/A;   LEFT HEART CATHETERIZATION WITH CORONARY ANGIOGRAM N/A 06/20/2014   Procedure: LEFT HEART CATHETERIZATION WITH CORONARY ANGIOGRAM;  Surgeon: Oneil JAYSON Parchment, MD;  Location: [facility] CATH LAB;  Service: Cardiovascular;  Laterality: N/A;    TEE WITHOUT CARDIOVERSION N/A 03/14/2013   Procedure: TRANSESOPHAGEAL ECHOCARDIOGRAM (TEE);  Surgeon: Oneil Parchment, MD;  Location: [facility] ENDOSCOPY;  Service: Cardiovascular;  Laterality: N/A;   TOTAL KNEE ARTHROPLASTY Left 07/10/2013   TOTAL KNEE ARTHROPLASTY Left 07/10/2013   Procedure: TOTAL KNEE ARTHROPLASTY;  Surgeon: Maude LELON Right, MD;  Location: [facility] OR;  Service: Orthopedics;  Laterality: Left;   VAGINAL HYSTERECTOMY  2000's   Patient Active Problem List   Diagnosis Date Noted   CVA (cerebral vascular accident) (HCC) 07/05/2023   Left-sided weakness 07/04/2023   HLD (hyperlipidemia) 07/04/2023   GAD (generalized anxiety disorder) 07/04/2023   Impingement syndrome of right shoulder 10/28/2021   Carpal tunnel syndrome, bilateral 12/29/2018   Pain in right knee 12/05/2018   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   NSTEMI (non-ST elevated myocardial infarction) (HCC)    Chest pain with moderate risk of acute coronary syndrome 08/31/2016   Normal coronary arteries-2016 08/31/2016   Chest pain on exertion 06/11/2014   Osteoarthritis of left knee 07/12/2013   S/P total knee replacement using cement 07/10/2013   Aneurysm, cerebral, nonruptured 04/23/2013   History of LMCA CVA-TPA 2014 03/09/2013   Osteoarthritis 11/14/2012   Restless legs syndrome 11/14/2012   GERD (gastroesophageal reflux disease) 11/13/2012   Essential hypertension 11/05/2012   Other and unspecified hyperlipidemia 11/05/2012  PCP: Delilah Murray HERO., MD  REFERRING PROVIDER: Delilah Murray HERO., MD  REFERRING DIAG: Diagnosis R26.9 (ICD-10-CM) - Unspecified abnormalities of gait and mobility  THERAPY DIAG:  Other lack of coordination  Muscle weakness (generalized)  Unsteadiness on feet  Other abnormalities of gait and mobility  Cerebrovascular accident (CVA), unspecified mechanism (HCC)  Rationale for Evaluation and Treatment: Rehabilitation  ONSET DATE: feelings of backsliding started 2 months ago    SUBJECTIVE:   SUBJECTIVE STATEMENT: I am doing alright. My R knee is bone on bone. I don't know that it is getting any better but I feel good that I am exercising more.   I just felt like I've been going backwards. Had that stroke in march and came here for PT after that, finished up in mid-May. I feel like I'm not doing as well. Have been keeping up with some of the exercises that they gave me, I just feel like I'm moving slower and R LE is giving me more trouble. It seems like its not as strong as the left. Think I'm doing pretty well otherwise.   PERTINENT HISTORY:  See above    PAIN:  Are you having pain? No  PRECAUTIONS: None  RED FLAGS: None   WEIGHT BEARING RESTRICTIONS: No  FALLS:  Has patient fallen in last 6 months? 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  DIAGNOSTIC FINDINGS:   From March 2025:  Narrative & Impression CLINICAL DATA:  83 year old female code stroke presentation. Left side deficit.   EXAM: MRI HEAD WITHOUT CONTRAST   TECHNIQUE: Multiplanar, multiecho pulse sequences of the brain and surrounding structures were obtained without intravenous contrast.   COMPARISON:  CT head, CTA and CTP today reported separately. [facility] brain MRI 06/10/2018.   FINDINGS: Brain: 11 mm wedge-shaped area of restricted diffusion in the posterior right corona radiata tracking toward  the superior right insula (series 11, image 19). Subtle T2 and FLAIR hyperintensity associated.   No other convincing diffusion restriction.   Since 2020 progressed chronic encephalomalacia and gliosis in the posterior left MCA territory. Cortical and white matter involvement there. Small chronic infarct in the inferior left cerebellum is stable. Small chronic lacunar infarcts in the bilateral deep gray nuclei appear more numerous. No chronic cerebral blood products identified on SWI. No midline shift, mass effect, evidence of mass lesion, ventriculomegaly, extra-axial collection or acute intracranial hemorrhage. Cervicomedullary junction and pituitary are within normal limits.   Vascular: Major intracranial vascular flow voids are stable since 2020.   Skull and upper cervical spine: Negative. Visualized bone marrow signal is within normal limits.   Sinuses/Orbits: Left sphenoid sinus disease redemonstrated. Mild mucosal thickening elsewhere. Postoperative changes to both globes.   Other: Mastoids well aerated. Visible scalp and face appear within normal limits.   IMPRESSION: 1. Positive for a small 11 mm Acute Infarct in the posterior right corona radiata tracking toward the insula. No associated hemorrhage or mass effect. 2.  Progressed chronic ischemic disease since a 2020 MRI, which is otherwise most pronounced in the posterior left MCA territory.      PATIENT SURVEYS:  PSFS: THE PATIENT SPECIFIC FUNCTIONAL SCALE  Place score of 0-10 (0 = unable to perform activity and 10 = able to perform activity at the same level as before injury or problem)  Activity Date: 12/15/23 eval     Going up and down steps  6    2.      3.     4.      Total Score 6      Total Score = Sum of activity scores/number of activities  Minimally Detectable Change: 3 points (for single activity); 2 points (for average score)  [facility] (nd). The Patient Specific Functional  Scale . Retrieved from [email or web address]   COGNITION: Overall cognitive status: Within functional limits for tasks assessed     SENSATION: Not tested     LOWER EXTREMITY MMT:  MMT Right eval Left eval  Hip flexion 3 4  Hip extension    Hip abduction 3 3  Hip adduction    Hip internal rotation    Hip external rotation    Knee flexion 3+ 4  Knee extension 3+ 4+  Ankle dorsiflexion 5 5  Ankle plantarflexion    Ankle inversion    Ankle eversion     (Blank rows = not tested)    FUNCTIONAL TESTS:  5 times sit to stand: 13 seconds hands on thighs  3 minute walk test: 443ft with SPC             DGI 14/24  GAIT: Distance walked: 410ft Assistive device utilized: Single point cane Level of assistance: Modified independence Comments: slow but steady with SPC                                                                                                                                 TREATMENT DATE:  01/06/24 NuStep L5x24mins  Leg ext 10# 2x12 HS curls 25# 2x12 Step ups 4 forward and lateral Side steps on airex One foot on airex with head turns Walk on beam STS 2x10  01/02/24 Gait around back building in parking lot, felt a pull in the front and the back pf her RLE with uphill ambulation HS curls 25lb 2x15 Leg Ext 10lb 2x15 4in forward & (lateral HHA x1) step ups x10 each Sit to stand holding yellow ball 2x10 RLE LAQ 5lb 2x10 RLE HS curls 2x15   12/28/23 Three laps around the from [place] in the back of building. Fatigue and slight instability towards the end Sit to stand holding yellow ball 2x10 Resisted gait 30lb 4 way x 3 each HS curls 25lb 2x12 Leg Ext 10lb 2x12 Alt 6in box taps x10  On airex x5    12/26/23:  Nustep L 5 x 7 min Standing for single leg slides with washcloth on ground, and  L UE support Hamstring curls 25# 2 x 10 Knee ext 10#, 2 x 10 In ll bars for penguin with black t band around  thighs, to fatigue Forward lunges foot in center of compliant surface BOSU, 15 x each in ll bars  Sit to stand from elevated table with forward punches with mediball, 2 # 10 reps Standing for tandem 30 sec holds each Seated hip add 5 sec holds with ball 10x Standing on airex for heel/ toe rocks B Forward step ups onto airex, alternating   12/21/23: Sit to stand from elevated mat 10 x, with forward punches with red mediball In ll bars for forward lunges on compliant side of BOSU, 15x each leg In ll bars for heel /toe rocks on airex, mass practice In ll bars for alt hip abd with 1 1/2# cuff wts on each leg mass practice Hamstring curls 25# 2 x 10 Knee ext 10#, 2 x 10 Nustep L 6 x 6 min 30 sec  Standing with rail for support, semi tandem, up to 30 sec holds, tends to lose balance to L Slides towel on floor, for/back, side/side emphasis on maintaining upright hip position, avoiding L lateral hip lurch   12/20/23 NuStep L5 x 6 min HS curls 20lb x10, 25lb x10  RLE 15lb x10  Leg Ext 10lb 2x10  RLE 5lb x10 Sit to stands 2x10 UE on knees  Hip add ball squeeze Hip abd blue 2x10 4in step ups 1 rail x10 each  Cues for sequencing Heel raises from floor 2x10   12/15/23   Exam findings, POC, education as below and HEP practice/discussion of new exercises as appropriate    PATIENT EDUCATION:  Education details: exam findings, POC, HEP updates, possible transitions to gym based program at [employer]) to help prevent another backslide  Person educated: Patient Education method: Programmer, multimedia, Facilities manager, Verbal cues, and Handouts Education comprehension: verbalized understanding, returned demonstration, and needs further education  HOME EXERCISE PROGRAM:  Access Code: [email or web address] URL: [email or web address] Date: 12/15/2023 Prepared by: Josette Rough  Exercises - Standing Hip Abduction with Counter Support  - 1 x daily - 7 x weekly - 2 sets - 10 reps - Mini Squat with  Counter Support  - 1 x daily - 7 x weekly - 2 sets - 10 reps - Standing March with Counter Support  - 1 x daily - 7 x weekly - 2 sets - 10 reps - Standing Tandem Balance with Counter Support  - 1 x daily - 7 x weekly - 3 reps - 30 hold - Standing Single Leg Stance with Counter Support  - 1 x daily - 7 x weekly - 5 reps - 10 hold - Supine Bridge  - 1 x daily - 7 x weekly - 1-2 sets - 10 reps - Sit to Stand with Resistance Around Legs  - 1 x daily - 7 x weekly - 1-2 sets - 10 reps - Tandem Walking with Counter Support  - 1 x daily - 7 x weekly - 1 sets - 5 reps  ASSESSMENT:  CLINICAL IMPRESSION:  Patient is a 83 y.o. F who participated  today with physical therapy treatment for Diagnosis R26.9 (ICD-10-CM) - Unspecified abnormalities of gait and mobility. Today we continued to address a combination of functional strength and endurance. Steps ups were hard for her when leading with RLE.  Need sCGA to minA with step ups and all interventions on airex. Will benefit from skilled PT interventions with potential transition to [facility] PREP  program at [place].   OBJECTIVE IMPAIRMENTS: Abnormal gait, decreased activity tolerance, decreased balance, decreased knowledge of use of DME, decreased mobility, difficulty walking, and decreased strength.   ACTIVITY LIMITATIONS: standing, squatting, stairs, transfers, and locomotion level  PARTICIPATION LIMITATIONS: meal prep, cleaning, laundry, driving, shopping, community activity, occupation, and yard work  PERSONAL FACTORS: Age, Behavior pattern, Education, Fitness, Past/current experiences, Profession, Sex, Social background, and Time since onset of injury/illness/exacerbation are also affecting patient's functional outcome.   REHAB POTENTIAL: Good  CLINICAL DECISION MAKING: Stable/uncomplicated  EVALUATION COMPLEXITY: Low   GOALS: Goals reviewed with patient? No  SHORT TERM GOALS: Target date: 01/05/2024   Will be compliant with appropriate progressive  HEP  Baseline: Goal status: 12/20/23 Met  LONG TERM GOALS: Target date: 01/26/2024      MMT to have improved by one grade in all weak groups  Baseline:  Goal status: INITIAL  2.  Will score at least 20 on DGI to show reduced fall risk  Baseline:  Goal status: INITIAL  3.  Will ambulate at least 522ft in with no device in order to show improved functional activity tolerance  Baseline:  Goal status: Progressing 12/28/23  4.  Will be able to ascend/descend steps no rails without knee buckling to demonstrate improved eccentric strength/motor control  Baseline:  Goal status: Progressing 12/28/23  5.  Will be compliant with appropriate gym based exercise program (transition to [facility] PREP if she is interested) Baseline:  Goal status: INITIAL     PLAN:  PT FREQUENCY: 2x/week  PT DURATION: 6 weeks  PLANNED INTERVENTIONS: 97750- Physical Performance Testing, 97110-Therapeutic exercises, 97530- Therapeutic activity, [id number]- Neuromuscular re-education, 97535- Self Care, [id number]- Manual therapy, and [id number]- Gait training  PLAN FOR NEXT SESSION: conditioning, strength, balance, PRE   Tanda Sorrow, PTA 01/06/24 8:40 AM

## 2024-01-06 ENCOUNTER — Ambulatory Visit

## 2024-01-06 DIAGNOSIS — R278 Other lack of coordination: Secondary | ICD-10-CM

## 2024-01-06 DIAGNOSIS — M6281 Muscle weakness (generalized): Secondary | ICD-10-CM

## 2024-01-06 DIAGNOSIS — R2681 Unsteadiness on feet: Secondary | ICD-10-CM

## 2024-01-06 DIAGNOSIS — R2689 Other abnormalities of gait and mobility: Secondary | ICD-10-CM

## 2024-01-06 DIAGNOSIS — I639 Cerebral infarction, unspecified: Secondary | ICD-10-CM

## 2024-01-09 ENCOUNTER — Ambulatory Visit: Admitting: Physical Therapy

## 2024-01-09 ENCOUNTER — Encounter: Payer: Self-pay | Admitting: Physical Therapy

## 2024-01-09 DIAGNOSIS — M6281 Muscle weakness (generalized): Secondary | ICD-10-CM

## 2024-01-09 DIAGNOSIS — R278 Other lack of coordination: Secondary | ICD-10-CM

## 2024-01-09 DIAGNOSIS — R2681 Unsteadiness on feet: Secondary | ICD-10-CM

## 2024-01-09 NOTE — Therapy (Signed)
 OUTPATIENT PHYSICAL THERAPY TREATMENT   Patient Name: Aimee Hood MRN: [id number] DOB:Sep 09, 1940, 83 y.o., female Today's Date: 01/09/2024  END OF SESSION:  PT End of Session - 01/09/24 0803     Visit Number 8    Date for Recertification  01/26/24    PT Start Time 0759    PT Stop Time 0845    PT Time Calculation (min) 46 min    Activity Tolerance Patient tolerated treatment well    Behavior During Therapy [facility] for tasks assessed/performed             Past Medical History:  Diagnosis Date   Allergy    Arthritis    all over (07/10/2013)   CAD (coronary artery disease)    a. normal cors by cath in 2016  b. 08/2016: admitted with an NSTEMI. Cath showed mild nonobstructive CAD with only 20% mid-LAD stenosis. EF was reduced to 35% and wall motion abnormalities were suggestive of a stress-induced cardiomyopathy.   Carpal tunnel syndrome of left wrist    Cerebral aneurysm, nonruptured    3-4 mm in size, stable since 2004 (most recent MRI 2014)   Colon polyp 2007   Diverticulitis    Family history of anesthesia complication    brother had PONV   Gastroesophageal reflux disease    High cholesterol    Hypertension    Insomnia    Macular degeneration 08/31/2016   PONV (postoperative nausea and vomiting)    Restless leg syndrome    Stroke [facility]) 02/2013   denies residual on 07/10/2013)   Past Surgical History:  Procedure Laterality Date   BREAST SURGERY     CHOLECYSTECTOMY     JOINT REPLACEMENT     KNEE ARTHROSCOPY Bilateral    LEFT HEART CATH AND CORONARY ANGIOGRAPHY N/A 09/01/2016   Procedure: Left Heart Cath and Coronary Angiography;  Surgeon: Darron Deatrice LABOR, MD;  Location: [facility] INVASIVE CV LAB;  Service: Cardiovascular;  Laterality: N/A;   LEFT HEART CATHETERIZATION WITH CORONARY ANGIOGRAM N/A 06/20/2014   Procedure: LEFT HEART CATHETERIZATION WITH CORONARY ANGIOGRAM;  Surgeon: Oneil JAYSON Parchment, MD;  Location: [facility] CATH LAB;  Service: Cardiovascular;  Laterality: N/A;    TEE WITHOUT CARDIOVERSION N/A 03/14/2013   Procedure: TRANSESOPHAGEAL ECHOCARDIOGRAM (TEE);  Surgeon: Oneil Parchment, MD;  Location: [facility] ENDOSCOPY;  Service: Cardiovascular;  Laterality: N/A;   TOTAL KNEE ARTHROPLASTY Left 07/10/2013   TOTAL KNEE ARTHROPLASTY Left 07/10/2013   Procedure: TOTAL KNEE ARTHROPLASTY;  Surgeon: Maude LELON Right, MD;  Location: [facility] OR;  Service: Orthopedics;  Laterality: Left;   VAGINAL HYSTERECTOMY  2000's   Patient Active Problem List   Diagnosis Date Noted   CVA (cerebral vascular accident) (HCC) 07/05/2023   Left-sided weakness 07/04/2023   HLD (hyperlipidemia) 07/04/2023   GAD (generalized anxiety disorder) 07/04/2023   Impingement syndrome of right shoulder 10/28/2021   Carpal tunnel syndrome, bilateral 12/29/2018   Pain in right knee 12/05/2018   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   NSTEMI (non-ST elevated myocardial infarction) (HCC)    Chest pain with moderate risk of acute coronary syndrome 08/31/2016   Normal coronary arteries-2016 08/31/2016   Chest pain on exertion 06/11/2014   Osteoarthritis of left knee 07/12/2013   S/P total knee replacement using cement 07/10/2013   Aneurysm, cerebral, nonruptured 04/23/2013   History of LMCA CVA-TPA 2014 03/09/2013   Osteoarthritis 11/14/2012   Restless legs syndrome 11/14/2012   GERD (gastroesophageal reflux disease) 11/13/2012   Essential hypertension 11/05/2012   Other and unspecified hyperlipidemia 11/05/2012  PCP: Delilah Murray HERO., MD  REFERRING PROVIDER: Delilah Murray HERO., MD  REFERRING DIAG: Diagnosis R26.9 (ICD-10-CM) - Unspecified abnormalities of gait and mobility  THERAPY DIAG:  Other lack of coordination  Muscle weakness (generalized)  Unsteadiness on feet  Rationale for Evaluation and Treatment: Rehabilitation  ONSET DATE: feelings of backsliding started 2 months ago   SUBJECTIVE:   SUBJECTIVE STATEMENT: Doing ok Feeling weak because she didn't feel good yesterday  I just  felt like I've been going backwards. Had that stroke in march and came here for PT after that, finished up in mid-May. I feel like I'm not doing as well. Have been keeping up with some of the exercises that they gave me, I just feel like I'm moving slower and R LE is giving me more trouble. It seems like its not as strong as the left. Think I'm doing pretty well otherwise.   PERTINENT HISTORY:  See above    PAIN:  Are you having pain? No  PRECAUTIONS: None  RED FLAGS: None   WEIGHT BEARING RESTRICTIONS: No  FALLS:  Has patient fallen in last 6 months? 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  DIAGNOSTIC FINDINGS:   From March 2025:  Narrative & Impression CLINICAL DATA:  83 year old female code stroke presentation. Left side deficit.   EXAM: MRI HEAD WITHOUT CONTRAST   TECHNIQUE: Multiplanar, multiecho pulse sequences of the brain and surrounding structures were obtained without intravenous contrast.   COMPARISON:  CT head, CTA and CTP today reported separately. [facility] brain MRI 06/10/2018.   FINDINGS: Brain: 11 mm wedge-shaped area of restricted diffusion in the posterior right corona radiata tracking toward the superior right insula (series 11, image 19). Subtle T2 and FLAIR hyperintensity associated.   No other convincing diffusion restriction.   Since 2020 progressed chronic  encephalomalacia and gliosis in the posterior left MCA territory. Cortical and white matter involvement there. Small chronic infarct in the inferior left cerebellum is stable. Small chronic lacunar infarcts in the bilateral deep gray nuclei appear more numerous. No chronic cerebral blood products identified on SWI. No midline shift, mass effect, evidence of mass lesion, ventriculomegaly, extra-axial collection or acute intracranial hemorrhage. Cervicomedullary junction and pituitary are within normal limits.   Vascular: Major intracranial vascular flow voids are stable since 2020.   Skull and upper cervical spine: Negative. Visualized bone marrow signal is within normal limits.   Sinuses/Orbits: Left sphenoid sinus disease redemonstrated. Mild mucosal thickening elsewhere. Postoperative changes to both globes.   Other: Mastoids well aerated. Visible scalp and face appear within normal limits.   IMPRESSION: 1. Positive for a small 11 mm Acute Infarct in the posterior right corona radiata tracking toward the insula. No associated hemorrhage or mass effect. 2. Progressed chronic ischemic disease since a 2020 MRI, which is otherwise most pronounced in the posterior left MCA territory.      PATIENT SURVEYS:  PSFS: THE PATIENT SPECIFIC FUNCTIONAL SCALE  Place score of 0-10 (0 = unable to perform activity and 10 = able to perform activity at the same level as before injury or problem)  Activity Date: 12/15/23 eval     Going up and down steps  6    2.      3.     4.      Total Score 6      Total Score = Sum of activity scores/number of activities  Minimally Detectable Change: 3 points (for single activity); 2 points (for average score)  [facility] (nd). The Patient Specific Functional Scale . Retrieved from [email or web address]   COGNITION: Overall cognitive status: Within functional limits for tasks  assessed     SENSATION: Not tested     LOWER EXTREMITY MMT:  MMT Right eval Left eval  Hip flexion 3 4  Hip extension    Hip abduction 3 3  Hip adduction    Hip internal rotation    Hip external rotation    Knee flexion 3+ 4  Knee extension 3+ 4+  Ankle dorsiflexion 5 5  Ankle plantarflexion    Ankle inversion    Ankle eversion     (Blank rows = not tested)    FUNCTIONAL TESTS:  5 times sit to stand: 13 seconds hands on thighs  3 minute walk test: 44ft with SPC             DGI 14/24  GAIT: Distance walked: 463ft Assistive device utilized: Single point cane Level of assistance: Modified independence Comments: slow but steady with SPC                                                                                                                                 TREATMENT DATE:  01/09/24 NuStep L 5 x 7 min 6in step ups x10 each, LOB x 4 requiring min assist to maintain stability Leg ext 10# 2x15 HS curls 25# 2x15 Sit to stand LE on airex x10 each Side steps on airex LOB x 3 min assist Gait 4 laps no AD ~ 411ft    01/06/24 NuStep L5x27mins  Leg ext 10# 2x12 HS curls 25# 2x12 Step ups 4 forward and lateral Side steps on airex One foot on airex with head turns Walk on beam STS 2x10  01/02/24 Gait around back building in parking lot, felt a pull in the front and the back pf her RLE with uphill ambulation HS curls 25lb 2x15 Leg Ext 10lb 2x15 4in forward & (lateral HHA x1) step ups x10 each Sit to stand holding yellow ball 2x10 RLE LAQ 5lb 2x10 RLE HS curls 2x15   12/28/23 Three laps around the from [place] in the back of building. Fatigue and slight instability towards the end Sit to stand holding yellow ball 2x10 Resisted gait 30lb 4 way x 3 each HS curls 25lb 2x12 Leg Ext 10lb 2x12 Alt 6in box taps x10  On airex  x5    12/26/23:  Nustep L 5 x 7 min Standing for single leg slides with washcloth on ground, and L UE support Hamstring curls 25# 2  x 10 Knee ext 10#, 2 x 10 In ll bars for penguin with black t band around thighs, to fatigue Forward lunges foot in center of compliant surface BOSU, 15 x each in ll bars  Sit to stand from elevated table with forward punches with mediball, 2 # 10 reps Standing for tandem 30 sec holds each Seated hip add 5 sec holds with ball 10x Standing on airex for heel/ toe rocks B Forward step ups onto airex, alternating   12/21/23: Sit to stand from elevated mat 10 x, with forward punches with red mediball In ll bars for forward lunges on compliant side of BOSU, 15x each leg In ll bars for heel /toe rocks on airex, mass practice In ll bars for alt hip abd with 1 1/2# cuff wts on each leg mass practice Hamstring curls 25# 2 x 10 Knee ext 10#, 2 x 10 Nustep L 6 x 6 min 30 sec  Standing with rail for support, semi tandem, up to 30 sec holds, tends to lose balance to L Slides towel on floor, for/back, side/side emphasis on maintaining upright hip position, avoiding L lateral hip lurch   12/20/23 NuStep L5 x 6 min HS curls 20lb x10, 25lb x10  RLE 15lb x10  Leg Ext 10lb 2x10  RLE 5lb x10 Sit to stands 2x10 UE on knees  Hip add ball squeeze Hip abd blue 2x10 4in step ups 1 rail x10 each  Cues for sequencing Heel raises from floor 2x10   12/15/23   Exam findings, POC, education as below and HEP practice/discussion of new exercises as appropriate    PATIENT EDUCATION:  Education details: exam findings, POC, HEP updates, possible transitions to gym based program at [employer]) to help prevent another backslide  Person educated: Patient Education method: Programmer, multimedia, Facilities manager, Verbal cues, and Handouts Education comprehension: verbalized understanding, returned demonstration, and needs further education  HOME EXERCISE PROGRAM:  Access Code: [email or web address] URL: [email or web address] Date: 12/15/2023 Prepared by: Josette Rough  Exercises - Standing Hip Abduction with Counter  Support  - 1 x daily - 7 x weekly - 2 sets - 10 reps - Mini Squat with Counter Support  - 1 x daily - 7 x weekly - 2 sets - 10 reps - Standing March with Counter Support  - 1 x daily - 7 x weekly - 2 sets - 10 reps - Standing Tandem Balance with Counter Support  - 1 x daily - 7 x weekly - 3 reps - 30 hold - Standing Single Leg Stance with Counter Support  - 1 x daily - 7 x weekly - 5 reps - 10 hold - Supine Bridge  - 1 x daily - 7 x weekly - 1-2 sets - 10 reps - Sit to Stand with Resistance Around Legs  - 1 x daily - 7 x weekly - 1-2 sets - 10 reps - Tandem Walking with Counter Support  - 1 x daily - 7 x weekly - 1 sets - 5 reps  ASSESSMENT:  CLINICAL IMPRESSION:  Patient is a 83 y.o. F who participated  today with physical therapy treatment for Diagnosis R26.9 (ICD-10-CM) - Unspecified abnormalities of gait and mobility. Today we continued to address a combination of functional strength and endurance. She has difficulty maintaining stability with step ups and side steps on airex.  Min to Mod assist needed at ties when she became unstable.  Some compensation present with sit to stands.  Will benefit from skilled PT interventions with potential transition to [facility] PREP program at [facility].   OBJECTIVE IMPAIRMENTS: Abnormal gait, decreased activity tolerance, decreased balance, decreased knowledge of use of DME, decreased mobility, difficulty walking, and decreased strength.   ACTIVITY LIMITATIONS: standing, squatting, stairs, transfers, and locomotion level  PARTICIPATION LIMITATIONS: meal prep, cleaning, laundry, driving, shopping, community activity, occupation, and yard work  PERSONAL FACTORS: Age, Behavior pattern, Education, Fitness, Past/current experiences, Profession, Sex, Social background, and Time since onset of injury/illness/exacerbation are also affecting patient's functional outcome.   REHAB POTENTIAL: Good  CLINICAL DECISION MAKING: Stable/uncomplicated  EVALUATION COMPLEXITY:  Low   GOALS: Goals reviewed with patient? No  SHORT TERM GOALS: Target date: 01/05/2024   Will be compliant with appropriate progressive HEP  Baseline: Goal status: 12/20/23 Met  LONG TERM GOALS: Target date: 01/26/2024      MMT to have improved by one grade in all weak groups  Baseline:  Goal status: INITIAL  2.  Will score at least 20 on DGI to show reduced fall risk  Baseline:  Goal status: INITIAL  3.  Will ambulate at least 551ft in with no device in order to show improved functional activity tolerance  Baseline:  Goal status: Progressing 12/28/23  4.  Will be able to ascend/descend steps no rails without knee buckling to demonstrate improved eccentric strength/motor control  Baseline:  Goal status: Progressing 12/28/23  5.  Will be compliant with appropriate gym based exercise program (transition to [facility] PREP if she is interested) Baseline:  Goal status: INITIAL     PLAN:  PT FREQUENCY: 2x/week  PT DURATION: 6 weeks  PLANNED INTERVENTIONS: 97750- Physical Performance Testing, 97110-Therapeutic exercises, 97530- Therapeutic activity, [id number]- Neuromuscular re-education, 97535- Self Care, [id number]- Manual therapy, and [id number]- Gait training  PLAN FOR NEXT SESSION: conditioning, strength, balance, PRE   Tanda Sorrow, PTA 01/09/24 8:03 AM

## 2024-01-11 ENCOUNTER — Encounter: Payer: Self-pay | Admitting: Physical Therapy

## 2024-01-11 ENCOUNTER — Ambulatory Visit: Attending: Internal Medicine | Admitting: Physical Therapy

## 2024-01-11 DIAGNOSIS — R2689 Other abnormalities of gait and mobility: Secondary | ICD-10-CM | POA: Insufficient documentation

## 2024-01-11 DIAGNOSIS — I639 Cerebral infarction, unspecified: Secondary | ICD-10-CM | POA: Diagnosis present

## 2024-01-11 DIAGNOSIS — R278 Other lack of coordination: Secondary | ICD-10-CM | POA: Diagnosis present

## 2024-01-11 DIAGNOSIS — M6281 Muscle weakness (generalized): Secondary | ICD-10-CM | POA: Diagnosis present

## 2024-01-11 DIAGNOSIS — R2681 Unsteadiness on feet: Secondary | ICD-10-CM | POA: Insufficient documentation

## 2024-01-11 NOTE — Therapy (Signed)
 OUTPATIENT PHYSICAL THERAPY TREATMENT   Patient Name: Aimee Hood MRN: [id number] DOB:01/10/1941, 83 y.o., female Today's Date: 01/11/2024  END OF SESSION:  PT End of Session - 01/11/24 0800     Visit Number 9    Date for Recertification  01/26/24    PT Start Time 0800    PT Stop Time 0845    PT Time Calculation (min) 45 min    Activity Tolerance Patient tolerated treatment well    Behavior During Therapy [facility] for tasks assessed/performed             Past Medical History:  Diagnosis Date   Allergy    Arthritis    all over (07/10/2013)   CAD (coronary artery disease)    a. normal cors by cath in 2016  b. 08/2016: admitted with an NSTEMI. Cath showed mild nonobstructive CAD with only 20% mid-LAD stenosis. EF was reduced to 35% and wall motion abnormalities were suggestive of a stress-induced cardiomyopathy.   Carpal tunnel syndrome of left wrist    Cerebral aneurysm, nonruptured    3-4 mm in size, stable since 2004 (most recent MRI 2014)   Colon polyp 2007   Diverticulitis    Family history of anesthesia complication    brother had PONV   Gastroesophageal reflux disease    High cholesterol    Hypertension    Insomnia    Macular degeneration 08/31/2016   PONV (postoperative nausea and vomiting)    Restless leg syndrome    Stroke [facility]) 02/2013   denies residual on 07/10/2013)   Past Surgical History:  Procedure Laterality Date   BREAST SURGERY     CHOLECYSTECTOMY     JOINT REPLACEMENT     KNEE ARTHROSCOPY Bilateral    LEFT HEART CATH AND CORONARY ANGIOGRAPHY N/A 09/01/2016   Procedure: Left Heart Cath and Coronary Angiography;  Surgeon: Darron Deatrice LABOR, MD;  Location: [facility] INVASIVE CV LAB;  Service: Cardiovascular;  Laterality: N/A;   LEFT HEART CATHETERIZATION WITH CORONARY ANGIOGRAM N/A 06/20/2014   Procedure: LEFT HEART CATHETERIZATION WITH CORONARY ANGIOGRAM;  Surgeon: Oneil JAYSON Parchment, MD;  Location: [facility] CATH LAB;  Service: Cardiovascular;  Laterality: N/A;    TEE WITHOUT CARDIOVERSION N/A 03/14/2013   Procedure: TRANSESOPHAGEAL ECHOCARDIOGRAM (TEE);  Surgeon: Oneil Parchment, MD;  Location: [facility] ENDOSCOPY;  Service: Cardiovascular;  Laterality: N/A;   TOTAL KNEE ARTHROPLASTY Left 07/10/2013   TOTAL KNEE ARTHROPLASTY Left 07/10/2013   Procedure: TOTAL KNEE ARTHROPLASTY;  Surgeon: Maude LELON Right, MD;  Location: [facility] OR;  Service: Orthopedics;  Laterality: Left;   VAGINAL HYSTERECTOMY  2000's   Patient Active Problem List   Diagnosis Date Noted   CVA (cerebral vascular accident) (HCC) 07/05/2023   Left-sided weakness 07/04/2023   HLD (hyperlipidemia) 07/04/2023   GAD (generalized anxiety disorder) 07/04/2023   Impingement syndrome of right shoulder 10/28/2021   Carpal tunnel syndrome, bilateral 12/29/2018   Pain in right knee 12/05/2018   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   NSTEMI (non-ST elevated myocardial infarction) (HCC)    Chest pain with moderate risk of acute coronary syndrome 08/31/2016   Normal coronary arteries-2016 08/31/2016   Chest pain on exertion 06/11/2014   Osteoarthritis of left knee 07/12/2013   S/P total knee replacement using cement 07/10/2013   Aneurysm, cerebral, nonruptured 04/23/2013   History of LMCA CVA-TPA 2014 03/09/2013   Osteoarthritis 11/14/2012   Restless legs syndrome 11/14/2012   GERD (gastroesophageal reflux disease) 11/13/2012   Essential hypertension 11/05/2012   Other and unspecified hyperlipidemia 11/05/2012  PCP: Delilah Murray HERO., MD  REFERRING PROVIDER: Delilah Murray HERO., MD  REFERRING DIAG: Diagnosis R26.9 (ICD-10-CM) - Unspecified abnormalities of gait and mobility  THERAPY DIAG:  Other lack of coordination  Muscle weakness (generalized)  Unsteadiness on feet  Other abnormalities of gait and mobility  Rationale for Evaluation and Treatment: Rehabilitation  ONSET DATE: feelings of backsliding started 2 months ago   SUBJECTIVE:   SUBJECTIVE STATEMENT: Doing ok Feeling weak because  she didn't feel good yesterday  I just felt like I've been going backwards. Had that stroke in march and came here for PT after that, finished up in mid-May. I feel like I'm not doing as well. Have been keeping up with some of the exercises that they gave me, I just feel like I'm moving slower and R LE is giving me more trouble. It seems like its not as strong as the left. Think I'm doing pretty well otherwise.   PERTINENT HISTORY:  See above    PAIN:  Are you having pain? No  PRECAUTIONS: None  RED FLAGS: None   WEIGHT BEARING RESTRICTIONS: No  FALLS:  Has patient fallen in last 6 months? 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  DIAGNOSTIC FINDINGS:   From March 2025:  Narrative & Impression CLINICAL DATA:  83 year old female code stroke presentation. Left side deficit.   EXAM: MRI HEAD WITHOUT CONTRAST   TECHNIQUE: Multiplanar, multiecho pulse sequences of the brain and surrounding structures were obtained without intravenous contrast.   COMPARISON:  CT head, CTA and CTP today reported separately. [facility] brain MRI 06/10/2018.   FINDINGS: Brain: 11 mm wedge-shaped area of restricted diffusion in the posterior right corona radiata tracking toward the superior right insula (series 11, image 19). Subtle T2 and FLAIR hyperintensity associated.   No other convincing diffusion restriction.    Since 2020 progressed chronic encephalomalacia and gliosis in the posterior left MCA territory. Cortical and white matter involvement there. Small chronic infarct in the inferior left cerebellum is stable. Small chronic lacunar infarcts in the bilateral deep gray nuclei appear more numerous. No chronic cerebral blood products identified on SWI. No midline shift, mass effect, evidence of mass lesion, ventriculomegaly, extra-axial collection or acute intracranial hemorrhage. Cervicomedullary junction and pituitary are within normal limits.   Vascular: Major intracranial vascular flow voids are stable since 2020.   Skull and upper cervical spine: Negative. Visualized bone marrow signal is within normal limits.   Sinuses/Orbits: Left sphenoid sinus disease redemonstrated. Mild mucosal thickening elsewhere. Postoperative changes to both globes.   Other: Mastoids well aerated. Visible scalp and face appear within normal limits.   IMPRESSION: 1. Positive for a small 11 mm Acute Infarct in the posterior right corona radiata tracking toward the insula. No associated hemorrhage or mass effect. 2. Progressed chronic ischemic disease since a 2020 MRI, which is otherwise most pronounced in the posterior left MCA territory.      PATIENT SURVEYS:  PSFS: THE PATIENT SPECIFIC FUNCTIONAL SCALE  Place score of 0-10 (0 = unable to perform activity and 10 = able to perform activity at the same level as before injury or problem)  Activity Date: 12/15/23 eval     Going up and down steps  6    2.      3.     4.      Total Score 6      Total Score = Sum of activity scores/number of activities  Minimally Detectable Change: 3 points (for single activity); 2 points (for average score)  [facility] (nd). The Patient Specific Functional Scale . Retrieved from [email or web address]   COGNITION: Overall cognitive status: Within  functional limits for tasks assessed     SENSATION: Not tested     LOWER EXTREMITY MMT:  MMT Right eval Left eval Right 01/11/24 Left 01/11/24  Hip flexion 3 4 4 4   Hip extension      Hip abduction 3 3 5 5   Hip adduction      Hip internal rotation      Hip external rotation      Knee flexion 3+ 4 4+ 5  Knee extension 3+ 4+ 4+ 5  Ankle dorsiflexion 5 5 5 5   Ankle plantarflexion      Ankle inversion      Ankle eversion       (Blank rows = not tested)    FUNCTIONAL TESTS:  5 times sit to stand: 13 seconds hands on thighs  3 minute walk test: 417ft with SPC             DGI 14/24  GAIT: Distance walked: 413ft Assistive device utilized: Single point cane Level of assistance: Modified independence Comments: slow but steady with SPC                                                                                                                                 TREATMENT DATE:  01/11/24 NuStep L 5 x 7 min Goals  MMT  592ft in 2:54 Sit to stand holding yellow ball 2x12 Leg ext 10# 2x15 HS curls 25# 2x15 6in step ups 2x5 each  CGA, some instability Side step on airex in // bars  Tandem walking in // bars  01/09/24 NuStep L 5 x 7 min 6in step ups x10 each, LOB x 4 requiring min assist to maintain stability Leg ext 10# 2x15 HS curls 25# 2x15 Sit to stand LE on airex x10 each Side steps on airex LOB x 3 min assist Gait 4 laps no AD ~ 47ft    01/06/24 NuStep L5x57mins  Leg ext 10# 2x12 HS curls 25# 2x12 Step ups 4 forward and lateral Side steps on airex One foot on airex with head turns Walk on beam STS 2x10  01/02/24 Gait around back building in parking lot, felt a pull in the front and the back pf her RLE  with uphill ambulation HS curls 25lb 2x15 Leg Ext 10lb 2x15 4in forward & (lateral HHA x1) step ups x10 each Sit to stand holding yellow ball 2x10 RLE LAQ 5lb 2x10 RLE HS curls 2x15   12/28/23 Three laps around the from [place] in the back of  building. Fatigue and slight instability towards the end Sit to stand holding yellow ball 2x10 Resisted gait 30lb 4 way x 3 each HS curls 25lb 2x12 Leg Ext 10lb 2x12 Alt 6in box taps x10  On airex x5    12/26/23:  Nustep L 5 x 7 min Standing for single leg slides with washcloth on ground, and L UE support Hamstring curls 25# 2 x 10 Knee ext 10#, 2 x 10 In ll bars for penguin with black t band around thighs, to fatigue Forward lunges foot in center of compliant surface BOSU, 15 x each in ll bars  Sit to stand from elevated table with forward punches with mediball, 2 # 10 reps Standing for tandem 30 sec holds each Seated hip add 5 sec holds with ball 10x Standing on airex for heel/ toe rocks B Forward step ups onto airex, alternating   12/21/23: Sit to stand from elevated mat 10 x, with forward punches with red mediball In ll bars for forward lunges on compliant side of BOSU, 15x each leg In ll bars for heel /toe rocks on airex, mass practice In ll bars for alt hip abd with 1 1/2# cuff wts on each leg mass practice Hamstring curls 25# 2 x 10 Knee ext 10#, 2 x 10 Nustep L 6 x 6 min 30 sec  Standing with rail for support, semi tandem, up to 30 sec holds, tends to lose balance to L Slides towel on floor, for/back, side/side emphasis on maintaining upright hip position, avoiding L lateral hip lurch   12/20/23 NuStep L5 x 6 min HS curls 20lb x10, 25lb x10  RLE 15lb x10  Leg Ext 10lb 2x10  RLE 5lb x10 Sit to stands 2x10 UE on knees  Hip add ball squeeze Hip abd blue 2x10 4in step ups 1 rail x10 each  Cues for sequencing Heel raises from floor 2x10   12/15/23   Exam findings, POC, education as below and HEP practice/discussion of new exercises as appropriate    PATIENT EDUCATION:  Education details: exam findings, POC, HEP updates, possible transitions to gym based program at [employer]) to help prevent another backslide  Person educated: Patient Education method:  Programmer, multimedia, Facilities manager, Verbal cues, and Handouts Education comprehension: verbalized understanding, returned demonstration, and needs further education  HOME EXERCISE PROGRAM:  Access Code: [email or web address] URL: [email or web address] Date: 12/15/2023 Prepared by: Josette Rough  Exercises - Standing Hip Abduction with Counter Support  - 1 x daily - 7 x weekly - 2 sets - 10 reps - Mini Squat with Counter Support  - 1 x daily - 7 x weekly - 2 sets - 10 reps - Standing March with Counter Support  - 1 x daily - 7 x weekly - 2 sets - 10 reps - Standing Tandem Balance with Counter Support  - 1 x daily - 7 x weekly - 3 reps - 30 hold - Standing Single Leg Stance with Counter Support  - 1 x daily - 7 x weekly - 5 reps - 10 hold - Supine Bridge  - 1 x daily - 7 x weekly - 1-2 sets - 10 reps - Sit to Stand with Resistance Around Legs  - 1 x  daily - 7 x weekly - 1-2 sets - 10 reps - Tandem Walking with Counter Support  - 1 x daily - 7 x weekly - 1 sets - 5 reps  ASSESSMENT:  CLINICAL IMPRESSION:  Patient is a 83 y.o. F who participated  today with physical therapy treatment for Diagnosis R26.9 (ICD-10-CM) - Unspecified abnormalities of gait and mobility. She has progressed increasing her LE strength. Pt has also progressed meeting her 3 minute walk test goal. Today we continued to address a combination of functional strength and endurance. Again instability with step ups.  Some compensation present with sit to stands.  Will benefit from skilled PT interventions with potential transition to [facility] PREP program at [place].   OBJECTIVE IMPAIRMENTS: Abnormal gait, decreased activity tolerance, decreased balance, decreased knowledge of use of DME, decreased mobility, difficulty walking, and decreased strength.   ACTIVITY LIMITATIONS: standing, squatting, stairs, transfers, and locomotion level  PARTICIPATION LIMITATIONS: meal prep, cleaning, laundry, driving, shopping, community activity,  occupation, and yard work  PERSONAL FACTORS: Age, Behavior pattern, Education, Fitness, Past/current experiences, Profession, Sex, Social background, and Time since onset of injury/illness/exacerbation are also affecting patient's functional outcome.   REHAB POTENTIAL: Good  CLINICAL DECISION MAKING: Stable/uncomplicated  EVALUATION COMPLEXITY: Low   GOALS: Goals reviewed with patient? No  SHORT TERM GOALS: Target date: 01/05/2024   Will be compliant with appropriate progressive HEP  Baseline: Goal status: 12/20/23 Met  LONG TERM GOALS: Target date: 01/26/2024      MMT to have improved by one grade in all weak groups  Baseline:  Goal status: Partly Met 01/11/24  2.  Will score at least 20 on DGI to show reduced fall risk  Baseline:  Goal status: INITIAL  3.  Will ambulate at least 578ft in with no device in order to show improved functional activity tolerance  Baseline:  Goal status: Progressing 12/28/23, Met 01/11/24  4.  Will be able to ascend/descend steps no rails without knee buckling to demonstrate improved eccentric strength/motor control  Baseline:  Goal status: Progressing 12/28/23  5.  Will be compliant with appropriate gym based exercise program (transition to [facility] PREP if she is interested) Baseline:  Goal status: INITIAL     PLAN:  PT FREQUENCY: 2x/week  PT DURATION: 6 weeks  PLANNED INTERVENTIONS: 97750- Physical Performance Testing, 97110-Therapeutic exercises, 97530- Therapeutic activity, [id number]- Neuromuscular re-education, 97535- Self Care, [id number]- Manual therapy, and [id number]- Gait training  PLAN FOR NEXT SESSION: conditioning, strength, balance, PRE   Tanda Sorrow, PTA 01/11/24 8:00 AM

## 2024-01-16 ENCOUNTER — Other Ambulatory Visit: Payer: Self-pay

## 2024-01-16 ENCOUNTER — Ambulatory Visit

## 2024-01-16 DIAGNOSIS — R2681 Unsteadiness on feet: Secondary | ICD-10-CM

## 2024-01-16 DIAGNOSIS — R278 Other lack of coordination: Secondary | ICD-10-CM | POA: Diagnosis not present

## 2024-01-16 DIAGNOSIS — I639 Cerebral infarction, unspecified: Secondary | ICD-10-CM

## 2024-01-16 DIAGNOSIS — M6281 Muscle weakness (generalized): Secondary | ICD-10-CM

## 2024-01-16 DIAGNOSIS — R2689 Other abnormalities of gait and mobility: Secondary | ICD-10-CM

## 2024-01-16 NOTE — Therapy (Signed)
 [facility] at [facility] [address] [facility]. [employer], [place], [address] Phone: [phone]   Fax:  [phone]  Patient Details  Name: Aimee Hood OUTPATIENT PHYSICAL THERAPY TREATMENT PROGRESS REPORT Progress Note Reporting Period 12/15/23 to 01/16/24  See note below for Objective Data and Assessment of Progress/Goals.      Patient Name: Aimee Hood MRN: [id number] DOB:March 11, 1941, 83 y.o., female Today's Date: 01/16/2024  END OF SESSION:  PT End of Session - 01/16/24 0808     Visit Number 10    Date for Recertification  01/26/24    Authorization Type HTA    Authorization Time Period 12/15/23 to 01/26/24    Progress Note Due on Visit 10    PT Start Time 0803    PT Stop Time 0845    PT Time Calculation (min) 42 min    Activity Tolerance Patient tolerated treatment well    Behavior During Therapy [facility] for tasks assessed/performed              Past Medical History:  Diagnosis Date   Allergy    Arthritis    all over (07/10/2013)   CAD (coronary artery disease)    a. normal cors by cath in 2016  b. 08/2016: admitted with an NSTEMI. Cath showed mild nonobstructive CAD with only 20% mid-LAD stenosis. EF was reduced to 35% and wall motion abnormalities were suggestive of a stress-induced cardiomyopathy.   Carpal tunnel syndrome of left wrist    Cerebral aneurysm, nonruptured    3-4 mm in size, stable since 2004 (most recent MRI 2014)   Colon polyp 2007   Diverticulitis    Family history of anesthesia complication    brother had PONV   Gastroesophageal reflux disease    High cholesterol    Hypertension    Insomnia    Macular degeneration 08/31/2016   PONV (postoperative nausea and vomiting)    Restless leg syndrome    Stroke [facility]) 02/2013   denies residual on 07/10/2013)   Past Surgical History:  Procedure Laterality Date   BREAST SURGERY     CHOLECYSTECTOMY     JOINT REPLACEMENT     KNEE ARTHROSCOPY Bilateral     LEFT HEART CATH AND CORONARY ANGIOGRAPHY N/A 09/01/2016   Procedure: Left Heart Cath and Coronary Angiography;  Surgeon: Darron Deatrice LABOR, MD;  Location: [facility] INVASIVE CV LAB;  Service: Cardiovascular;  Laterality: N/A;   LEFT HEART CATHETERIZATION WITH CORONARY ANGIOGRAM N/A 06/20/2014   Procedure: LEFT HEART CATHETERIZATION WITH CORONARY ANGIOGRAM;  Surgeon: Oneil JAYSON Parchment, MD;  Location: [facility] CATH LAB;  Service: Cardiovascular;  Laterality: N/A;   TEE WITHOUT CARDIOVERSION N/A 03/14/2013   Procedure: TRANSESOPHAGEAL ECHOCARDIOGRAM (TEE);  Surgeon: Oneil Parchment, MD;  Location: [facility] ENDOSCOPY;  Service: Cardiovascular;  Laterality: N/A;   TOTAL KNEE ARTHROPLASTY Left 07/10/2013   TOTAL KNEE ARTHROPLASTY Left 07/10/2013   Procedure: TOTAL KNEE ARTHROPLASTY;  Surgeon: Maude LELON Right, MD;  Location: [facility] OR;  Service: Orthopedics;  Laterality: Left;   VAGINAL HYSTERECTOMY  2000's   Patient Active Problem List   Diagnosis Date Noted   CVA (cerebral vascular accident) (HCC) 07/05/2023   Left-sided weakness 07/04/2023   HLD (hyperlipidemia) 07/04/2023   GAD (generalized anxiety disorder) 07/04/2023   Impingement syndrome of right shoulder 10/28/2021   Carpal tunnel syndrome, bilateral 12/29/2018   Pain in right knee 12/05/2018   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   NSTEMI (non-ST elevated myocardial infarction) (HCC)    Chest pain with  moderate risk of acute coronary syndrome 08/31/2016   Normal coronary arteries-2016 08/31/2016   Chest pain on exertion 06/11/2014   Osteoarthritis of left knee 07/12/2013   S/P total knee replacement using cement 07/10/2013   Aneurysm, cerebral, nonruptured 04/23/2013   History of LMCA CVA-TPA 2014 03/09/2013   Osteoarthritis 11/14/2012   Restless legs syndrome 11/14/2012   GERD (gastroesophageal reflux disease) 11/13/2012   Essential hypertension 11/05/2012   Other and unspecified hyperlipidemia 11/05/2012    PCP: Delilah Murray HERO., MD  REFERRING PROVIDER:  Delilah Murray HERO., MD  REFERRING DIAG: Diagnosis R26.9 (ICD-10-CM) - Unspecified abnormalities of gait and mobility  THERAPY DIAG:  No diagnosis found.  Rationale for Evaluation and Treatment: Rehabilitation  ONSET DATE: feelings of backsliding started 2 months ago   SUBJECTIVE:   SUBJECTIVE STATEMENT: I feel well, [facility]'t [facility] yet  do I need to get a doctor's order to do that?  I just felt like I've been going backwards. Had that stroke in march and came here for PT after that, finished up in mid-May. I feel like I'm not doing as well. Have been keeping up with some of the exercises that they gave me, I just feel like I'm moving slower and R LE is giving me more trouble. It seems like its not as strong as the left. Think I'm doing pretty well otherwise.   PERTINENT HISTORY:  See above    PAIN:  Are you having pain? No  PRECAUTIONS: None  RED FLAGS: None   WEIGHT BEARING RESTRICTIONS: No  FALLS:  Has patient fallen in last 6 months? 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  DIAGNOSTIC FINDINGS:   From March 2025:  Narrative & Impression CLINICAL DATA:  83 year old female code stroke presentation. Left side deficit.   EXAM: MRI HEAD WITHOUT CONTRAST   TECHNIQUE: Multiplanar, multiecho pulse sequences of the brain and surrounding structures were obtained without intravenous contrast.    COMPARISON:  CT head, CTA and CTP today reported separately. [facility] brain MRI 06/10/2018.   FINDINGS: Brain: 11 mm wedge-shaped area of restricted diffusion in the posterior right corona radiata tracking toward the superior right insula (series 11, image 19). Subtle T2 and FLAIR hyperintensity associated.   No other convincing diffusion restriction.   Since 2020 progressed chronic encephalomalacia and gliosis in the posterior left MCA territory. Cortical and white matter involvement there. Small chronic infarct in the inferior left cerebellum is stable. Small chronic lacunar infarcts in the bilateral deep gray nuclei appear more numerous. No chronic cerebral blood products identified on SWI. No midline shift, mass effect, evidence of mass lesion, ventriculomegaly, extra-axial collection or acute intracranial hemorrhage. Cervicomedullary junction and pituitary are within normal limits.   Vascular: Major intracranial vascular flow voids are stable since 2020.   Skull and upper cervical spine: Negative. Visualized bone marrow signal is within normal limits.   Sinuses/Orbits: Left sphenoid sinus disease redemonstrated. Mild mucosal  thickening elsewhere. Postoperative changes to both globes.   Other: Mastoids well aerated. Visible scalp and face appear within normal limits.   IMPRESSION: 1. Positive for a small 11 mm Acute Infarct in the posterior right corona radiata tracking toward the insula. No associated hemorrhage or mass effect. 2. Progressed chronic ischemic disease since a 2020 MRI, which is otherwise most pronounced in the posterior left MCA territory.      PATIENT SURVEYS:  PSFS: THE PATIENT SPECIFIC FUNCTIONAL SCALE  Place score of 0-10 (0 = unable to perform activity and 10 = able to perform activity at the same level as before injury or problem)  Activity Date: 12/15/23 eval     Going up and down steps  6    2.      3.     4.      Total  Score 6      Total Score = Sum of activity scores/number of activities  Minimally Detectable Change: 3 points (for single activity); 2 points (for average score)  [facility] (nd). The Patient Specific Functional Scale . Retrieved from [email or web address]   COGNITION: Overall cognitive status: Within functional limits for tasks assessed     SENSATION: Not tested     LOWER EXTREMITY MMT:  MMT Right eval Left eval Right 01/11/24 Left 01/11/24  Hip flexion 3 4 4 4   Hip extension      Hip abduction 3 3 5 5   Hip adduction      Hip internal rotation      Hip external rotation      Knee flexion 3+ 4 4+ 5  Knee extension 3+ 4+ 4+ 5  Ankle dorsiflexion 5 5 5 5   Ankle plantarflexion      Ankle inversion      Ankle eversion       (Blank rows = not tested)    FUNCTIONAL TESTS:  5 times sit to stand: 13 seconds hands on thighs  3 minute walk test: 460ft with SPC             DGI 14/24  GAIT: Distance walked: 415ft Assistive device utilized: Single point cane Level of assistance: Modified independence Comments: slow but steady with SPC                                                                                                                                 TREATMENT DATE:  01/16/24:  DGI: 17/24 Nustep L 5 x 6 min Ue's and LE's 3 min walk test without cane   ll bars for heel/toe walking In ll bars for side stepping over airex  In ll bars for heel/toe rocks on airex B Knee ext 10# 3 x 10 Knee flexion B 25# 3 x 10    01/11/24 NuStep L 5 x 7 min Goals  MMT  552ft in 2:54 Sit to stand holding yellow ball 2x12 Leg ext 10# 2x15 HS  curls 25# 2x15 6in step ups 2x5 each  CGA, some instability Side step on airex in // bars  Tandem walking in // bars  01/09/24 NuStep L 5 x 7 min 6in step ups x10 each, LOB x 4 requiring min assist to maintain stability Leg ext 10# 2x15 HS curls 25# 2x15 Sit to  stand LE on airex x10 each Side steps on airex LOB x 3 min assist Gait 4 laps no AD ~ 449ft    01/06/24 NuStep L5x28mins  Leg ext 10# 2x12 HS curls 25# 2x12 Step ups 4 forward and lateral Side steps on airex One foot on airex with head turns Walk on beam STS 2x10  01/02/24 Gait around back building in parking lot, felt a pull in the front and the back pf her RLE with uphill ambulation HS curls 25lb 2x15 Leg Ext 10lb 2x15 4in forward & (lateral HHA x1) step ups x10 each Sit to stand holding yellow ball 2x10 RLE LAQ 5lb 2x10 RLE HS curls 2x15   12/28/23 Three laps around the from [place] in the back of building. Fatigue and slight instability towards the end Sit to stand holding yellow ball 2x10 Resisted gait 30lb 4 way x 3 each HS curls 25lb 2x12 Leg Ext 10lb 2x12 Alt 6in box taps x10  On airex x5    12/26/23:  Nustep L 5 x 7 min Standing for single leg slides with washcloth on ground, and L UE support Hamstring curls 25# 2 x 10 Knee ext 10#, 2 x 10 In ll bars for penguin with black t band around thighs, to fatigue Forward lunges foot in center of compliant surface BOSU, 15 x each in ll bars  Sit to stand from elevated table with forward punches with mediball, 2 # 10 reps Standing for tandem 30 sec holds each Seated hip add 5 sec holds with ball 10x Standing on airex for heel/ toe rocks B Forward step ups onto airex, alternating   12/21/23: Sit to stand from elevated mat 10 x, with forward punches with red mediball In ll bars for forward lunges on compliant side of BOSU, 15x each leg In ll bars for heel /toe rocks on airex, mass practice In ll bars for alt hip abd with 1 1/2# cuff wts on each leg mass practice Hamstring curls 25# 2 x 10 Knee ext 10#, 2 x 10 Nustep L 6 x 6 min 30 sec  Standing with rail for support, semi tandem, up to 30 sec holds, tends to lose balance to L Slides towel on floor, for/back, side/side emphasis on maintaining upright hip position,  avoiding L lateral hip lurch   12/20/23 NuStep L5 x 6 min HS curls 20lb x10, 25lb x10  RLE 15lb x10  Leg Ext 10lb 2x10  RLE 5lb x10 Sit to stands 2x10 UE on knees  Hip add ball squeeze Hip abd blue 2x10 4in step ups 1 rail x10 each  Cues for sequencing Heel raises from floor 2x10   12/15/23   Exam findings, POC, education as below and HEP practice/discussion of new exercises as appropriate    PATIENT EDUCATION:  Education details: exam findings, POC, HEP updates, possible transitions to gym based program at [employer]) to help prevent another backslide  Person educated: Patient Education method: Programmer, multimedia, Facilities manager, Verbal cues, and Handouts Education comprehension: verbalized understanding, returned demonstration, and needs further education  HOME EXERCISE PROGRAM:  Access Code: [email or web address] URL: [email or web address] Date: 12/15/2023 Prepared by: Josette Rough  Exercises - Standing  Hip Abduction with Counter Support  - 1 x daily - 7 x weekly - 2 sets - 10 reps - Mini Squat with Counter Support  - 1 x daily - 7 x weekly - 2 sets - 10 reps - Standing March with Counter Support  - 1 x daily - 7 x weekly - 2 sets - 10 reps - Standing Tandem Balance with Counter Support  - 1 x daily - 7 x weekly - 3 reps - 30 hold - Standing Single Leg Stance with Counter Support  - 1 x daily - 7 x weekly - 5 reps - 10 hold - Supine Bridge  - 1 x daily - 7 x weekly - 1-2 sets - 10 reps - Sit to Stand with Resistance Around Legs  - 1 x daily - 7 x weekly - 1-2 sets - 10 reps - Tandem Walking with Counter Support  - 1 x daily - 7 x weekly - 1 sets - 5 reps  ASSESSMENT:  CLINICAL IMPRESSION:  Patient is a 83 y.o. F who participated  today with physical therapy treatment for Diagnosis R26.9 (ICD-10-CM) - Unspecified abnormalities of gait and mobility. She . She has made progress in all areas, toward her goals.  Much of her balance deficits is due to R knee instability with  advanced arthritis.  Her endurance is excellent.  Will benefit from skilled PT interventions with potential transition to [facility] PREP program at [place]. She should be ready to [facility] as planned next week.  OBJECTIVE IMPAIRMENTS: Abnormal gait, decreased activity tolerance, decreased balance, decreased knowledge of use of DME, decreased mobility, difficulty walking, and decreased strength.   ACTIVITY LIMITATIONS: standing, squatting, stairs, transfers, and locomotion level  PARTICIPATION LIMITATIONS: meal prep, cleaning, laundry, driving, shopping, community activity, occupation, and yard work  PERSONAL FACTORS: Age, Behavior pattern, Education, Fitness, Past/current experiences, Profession, Sex, Social background, and Time since onset of injury/illness/exacerbation are also affecting patient's functional outcome.   REHAB POTENTIAL: Good  CLINICAL DECISION MAKING: Stable/uncomplicated  EVALUATION COMPLEXITY: Low   GOALS: Goals reviewed with patient? No  SHORT TERM GOALS: Target date: 01/05/2024   Will be compliant with appropriate progressive HEP  Baseline: Goal status: 12/20/23 Met  LONG TERM GOALS: Target date: 01/26/2024      MMT to have improved by one grade in all weak groups  Baseline:  Goal status: Partly Met 01/11/24  2.  Will score at least 20 on DGI to show reduced fall risk  Baseline:  Goal status:   01/16/24: 17/24    3.  Will ambulate at least 565ft in with no device in order to show improved functional activity tolerance  Baseline:  Goal status: Progressing 12/28/23, Met 01/11/24 01/16/24: exceeded 600'   4.  Will be able to ascend/descend steps no rails without knee buckling to demonstrate improved eccentric strength/motor control  Baseline:  Goal status: Progressing 01/16/24:   5.  Will be compliant with appropriate gym based exercise program (transition to [facility] PREP if she is interested) Baseline:  Goal status: 01/16/24: [facility]  about [facility]     PLAN:  PT FREQUENCY: 2x/week  PT DURATION: 6 weeks  PLANNED INTERVENTIONS: 97750- Physical Performance Testing, 97110-Therapeutic exercises, 97530- Therapeutic activity, [id number]- Neuromuscular re-education, 97535- Self Care, [id number]- Manual therapy, and 97116- Gait training  PLAN FOR NEXT SESSION: conditioning, strength, balance, PRE    MRN: [id number] Date of Birth: 01/15/1941 Referring Provider:  Delilah Murray HERO., MD  Encounter Date: 01/16/2024   Greig LITTIE Credit, PT, DPT, OCS 01/16/2024, 2:14 PM  [facility] Outpatient Rehabilitation at [facility] [address] [facility]. [place], [place], [address] Phone: [phone]   Fax:  [phone]

## 2024-01-18 ENCOUNTER — Ambulatory Visit: Admitting: Physical Therapy

## 2024-01-18 ENCOUNTER — Encounter: Payer: Self-pay | Admitting: Physical Therapy

## 2024-01-18 DIAGNOSIS — R278 Other lack of coordination: Secondary | ICD-10-CM

## 2024-01-18 DIAGNOSIS — R2681 Unsteadiness on feet: Secondary | ICD-10-CM

## 2024-01-18 DIAGNOSIS — M6281 Muscle weakness (generalized): Secondary | ICD-10-CM

## 2024-01-18 NOTE — Therapy (Signed)
 [facility] at [facility] [address] [facility]. [place], [place], [address] Phone: [phone]   Fax:  [phone]  Patient Details  Name: Aimee Hood OUTPATIENT PHYSICAL THERAPY TREATMENT PROGRESS REPORT Progress Note Reporting Period 12/15/23 to 01/16/24  See note below for Objective Data and Assessment of Progress/Goals.      Patient Name: Aimee Hood MRN: [id number] DOB:1940/11/05, 83 y.o., female Today's Date: 01/18/2024  END OF SESSION:  PT End of Session - 01/18/24 0759     Visit Number 11    Date for Recertification  01/26/24    PT Start Time 0759    PT Stop Time 0844    PT Time Calculation (min) 45 min    Activity Tolerance Patient tolerated treatment well    Behavior During Therapy [facility] for tasks assessed/performed              Past Medical History:  Diagnosis Date   Allergy    Arthritis    all over (07/10/2013)   CAD (coronary artery disease)    a. normal cors by cath in 2016  b. 08/2016: admitted with an NSTEMI. Cath showed mild nonobstructive CAD with only 20% mid-LAD stenosis. EF was reduced to 35% and wall motion abnormalities were suggestive of a stress-induced cardiomyopathy.   Carpal tunnel syndrome of left wrist    Cerebral aneurysm, nonruptured    3-4 mm in size, stable since 2004 (most recent MRI 2014)   Colon polyp 2007   Diverticulitis    Family history of anesthesia complication    brother had PONV   Gastroesophageal reflux disease    High cholesterol    Hypertension    Insomnia    Macular degeneration 08/31/2016   PONV (postoperative nausea and vomiting)    Restless leg syndrome    Stroke [facility]) 02/2013   denies residual on 07/10/2013)   Past Surgical History:  Procedure Laterality Date   BREAST SURGERY     CHOLECYSTECTOMY     JOINT REPLACEMENT     KNEE ARTHROSCOPY Bilateral    LEFT HEART CATH AND CORONARY ANGIOGRAPHY N/A 09/01/2016   Procedure: Left Heart Cath and Coronary Angiography;   Surgeon: Darron Deatrice LABOR, MD;  Location: [facility] INVASIVE CV LAB;  Service: Cardiovascular;  Laterality: N/A;   LEFT HEART CATHETERIZATION WITH CORONARY ANGIOGRAM N/A 06/20/2014   Procedure: LEFT HEART CATHETERIZATION WITH CORONARY ANGIOGRAM;  Surgeon: Oneil JAYSON Parchment, MD;  Location: [facility] CATH LAB;  Service: Cardiovascular;  Laterality: N/A;   TEE WITHOUT CARDIOVERSION N/A 03/14/2013   Procedure: TRANSESOPHAGEAL ECHOCARDIOGRAM (TEE);  Surgeon: Oneil Parchment, MD;  Location: [facility] ENDOSCOPY;  Service: Cardiovascular;  Laterality: N/A;   TOTAL KNEE ARTHROPLASTY Left 07/10/2013   TOTAL KNEE ARTHROPLASTY Left 07/10/2013   Procedure: TOTAL KNEE ARTHROPLASTY;  Surgeon: Maude LELON Right, MD;  Location: [facility] OR;  Service: Orthopedics;  Laterality: Left;   VAGINAL HYSTERECTOMY  2000's   Patient Active Problem List   Diagnosis Date Noted   CVA (cerebral vascular accident) (HCC) 07/05/2023   Left-sided weakness 07/04/2023   HLD (hyperlipidemia) 07/04/2023   GAD (generalized anxiety disorder) 07/04/2023   Impingement syndrome of right shoulder 10/28/2021   Carpal tunnel syndrome, bilateral 12/29/2018   Pain in right knee 12/05/2018   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   NSTEMI (non-ST elevated myocardial infarction) [facility])    Chest pain with moderate risk of acute coronary syndrome 08/31/2016   Normal coronary arteries-2016 08/31/2016   Chest pain on exertion 06/11/2014   Osteoarthritis of  left knee 07/12/2013   S/P total knee replacement using cement 07/10/2013   Aneurysm, cerebral, nonruptured 04/23/2013   History of LMCA CVA-TPA 2014 03/09/2013   Osteoarthritis 11/14/2012   Restless legs syndrome 11/14/2012   GERD (gastroesophageal reflux disease) 11/13/2012   Essential hypertension 11/05/2012   Other and unspecified hyperlipidemia 11/05/2012    PCP: Delilah Murray HERO., MD  REFERRING PROVIDER: Delilah Murray HERO., MD  REFERRING DIAG: Diagnosis R26.9 (ICD-10-CM) - Unspecified abnormalities of gait and  mobility  THERAPY DIAG:  Other lack of coordination  Muscle weakness (generalized)  Unsteadiness on feet  Rationale for Evaluation and Treatment: Rehabilitation  ONSET DATE: feelings of backsliding started 2 months ago   SUBJECTIVE:   SUBJECTIVE STATEMENT: Doing ok, still have trouble stepping up on step and cubs  I just felt like I've been going backwards. Had that stroke in march and came here for PT after that, finished up in mid-May. I feel like I'm not doing as well. Have been keeping up with some of the exercises that they gave me, I just feel like I'm moving slower and R LE is giving me more trouble. It seems like its not as strong as the left. Think I'm doing pretty well otherwise.   PERTINENT HISTORY:  See above    PAIN:  Are you having pain? No  PRECAUTIONS: None  RED FLAGS: None   WEIGHT BEARING RESTRICTIONS: No  FALLS:  Has patient fallen in last 6 months? 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  DIAGNOSTIC FINDINGS:   From March 2025:  Narrative & Impression CLINICAL DATA:  83 year old female code stroke presentation. Left side deficit.   EXAM: MRI HEAD WITHOUT CONTRAST   TECHNIQUE: Multiplanar, multiecho pulse sequences of the brain and surrounding structures were obtained without intravenous contrast.   COMPARISON:  CT head, CTA and CTP today reported separately. [facility] brain MRI 06/10/2018.   FINDINGS: Brain: 11 mm wedge-shaped area of restricted diffusion in the posterior right corona radiata tracking toward the superior right insula (series 11, image 19). Subtle T2 and FLAIR hyperintensity associated.   No other convincing diffusion restriction.   Since 2020 progressed chronic encephalomalacia and gliosis in the posterior left MCA territory. Cortical and white matter involvement there. Small chronic infarct in the inferior left cerebellum is stable. Small chronic lacunar infarcts in the bilateral deep gray nuclei appear more numerous. No chronic cerebral blood products identified on SWI. No midline shift, mass effect, evidence of mass lesion, ventriculomegaly, extra-axial collection or acute intracranial hemorrhage. Cervicomedullary junction and pituitary are within normal limits.   Vascular: Major intracranial vascular flow voids are stable since 2020.   Skull and upper cervical spine: Negative. Visualized bone marrow signal is within normal limits.   Sinuses/Orbits: Left sphenoid sinus disease redemonstrated. Mild mucosal thickening elsewhere. Postoperative changes to both globes.   Other: Mastoids well aerated. Visible scalp and face appear within normal limits.   IMPRESSION: 1.  Positive for a small 11 mm Acute Infarct in the posterior right corona radiata tracking toward the insula. No associated hemorrhage or mass effect. 2. Progressed chronic ischemic disease since a 2020 MRI, which is otherwise most pronounced in the posterior left MCA territory.      PATIENT SURVEYS:  PSFS: THE PATIENT SPECIFIC FUNCTIONAL SCALE  Place score of 0-10 (0 = unable to perform activity and 10 = able to perform activity at the same level as before injury or problem)  Activity Date: 12/15/23 eval     Going up and down steps  6    2.      3.     4.      Total Score 6      Total Score = Sum of activity scores/number of  activities  Minimally Detectable Change: 3 points (for single activity); 2 points (for average score)  [facility] (nd). The Patient Specific Functional Scale . Retrieved from [email or web address]   COGNITION: Overall cognitive status: Within functional limits for tasks assessed     SENSATION: Not tested     LOWER EXTREMITY MMT:  MMT Right eval Left eval Right 01/11/24 Left 01/11/24  Hip flexion 3 4 4 4   Hip extension      Hip abduction 3 3 5 5   Hip adduction      Hip internal rotation      Hip external rotation      Knee flexion 3+ 4 4+ 5  Knee extension 3+ 4+ 4+ 5  Ankle dorsiflexion 5 5 5 5   Ankle plantarflexion      Ankle inversion      Ankle eversion       (Blank rows = not tested)    FUNCTIONAL TESTS:  5 times sit to stand: 13 seconds hands on thighs  3 minute walk test: 442ft with SPC             DGI 14/24  GAIT: Distance walked: 470ft Assistive device utilized: Single point cane Level of assistance: Modified independence Comments: slow but steady with SPC                                                                                                                                 TREATMENT DATE:  01/18/24 NuStep L 5 x 7 min  Gait around the widest part of back building in parking low u and down slope ~ 1/10 of a mile,  some unsteadiness as she fatigues.  Alt box taps 4 in x10 then 6 in x 10 no AD Resisted side steps 30lb x 5 each HS curls 35lb 2x15 Leg Ext 10lb 2x15   01/16/24:  DGI: 17/24 Nustep L 5 x 6 min Ue's and LE's 3 min walk test without cane   ll bars for heel/toe walking In ll bars for side stepping over airex  In ll bars for heel/toe rocks on airex B Knee ext 10#  3 x 10 Knee flexion B 25# 3 x 10    01/11/24 NuStep L 5 x 7 min Goals  MMT  571ft in 2:54 Sit to stand holding yellow ball 2x12 Leg ext 10# 2x15 HS curls 25# 2x15 6in step ups 2x5 each  CGA, some  instability Side step on airex in // bars  Tandem walking in // bars  01/09/24 NuStep L 5 x 7 min 6in step ups x10 each, LOB x 4 requiring min assist to maintain stability Leg ext 10# 2x15 HS curls 25# 2x15 Sit to stand LE on airex x10 each Side steps on airex LOB x 3 min assist Gait 4 laps no AD ~ 458ft    01/06/24 NuStep L5x58mins  Leg ext 10# 2x12 HS curls 25# 2x12 Step ups 4 forward and lateral Side steps on airex One foot on airex with head turns Walk on beam STS 2x10  01/02/24 Gait around back building in parking lot, felt a pull in the front and the back pf her RLE with uphill ambulation HS curls 25lb 2x15 Leg Ext 10lb 2x15 4in forward & (lateral HHA x1) step ups x10 each Sit to stand holding yellow ball 2x10 RLE LAQ 5lb 2x10 RLE HS curls 2x15   12/28/23 Three laps around the from [place] in the back of building. Fatigue and slight instability towards the end Sit to stand holding yellow ball 2x10 Resisted gait 30lb 4 way x 3 each HS curls 25lb 2x12 Leg Ext 10lb 2x12 Alt 6in box taps x10  On airex x5    12/26/23:  Nustep L 5 x 7 min Standing for single leg slides with washcloth on ground, and L UE support Hamstring curls 25# 2 x 10 Knee ext 10#, 2 x 10 In ll bars for penguin with black t band around thighs, to fatigue Forward lunges foot in center of compliant surface BOSU, 15 x each in ll bars  Sit to stand from elevated table with forward punches with mediball, 2 # 10 reps Standing for tandem 30 sec holds each Seated hip add 5 sec holds with ball 10x Standing on airex for heel/ toe rocks B Forward step ups onto airex, alternating   12/21/23: Sit to stand from elevated mat 10 x, with forward punches with red mediball In ll bars for forward lunges on compliant side of BOSU, 15x each leg In ll bars for heel /toe rocks on airex, mass practice In ll bars for alt hip abd with 1 1/2# cuff wts on each leg mass practice Hamstring curls 25# 2 x 10 Knee ext 10#,  2 x 10 Nustep L 6 x 6 min 30 sec  Standing with rail for support, semi tandem, up to 30 sec holds, tends to lose balance to L Slides towel on floor, for/back, side/side emphasis on maintaining upright hip position, avoiding L lateral hip lurch   12/20/23 NuStep L5 x 6 min HS curls 20lb x10, 25lb x10  RLE 15lb x10  Leg Ext 10lb 2x10  RLE 5lb x10 Sit to stands 2x10 UE on knees  Hip add ball squeeze Hip abd blue 2x10 4in step ups 1 rail x10 each  Cues for sequencing Heel raises from floor 2x10   12/15/23   Exam findings, POC, education as below and HEP practice/discussion of new exercises as appropriate    PATIENT EDUCATION:  Education details: exam findings, POC, HEP updates, possible transitions to gym based program at [facility]) to help prevent another backslide  Person  educated: Patient Education method: Explanation, Demonstration, Verbal cues, and Handouts Education comprehension: verbalized understanding, returned demonstration, and needs further education  HOME EXERCISE PROGRAM:  Access Code: [email or web address] URL: [email or web address] Date: 12/15/2023 Prepared by: Josette Rough  Exercises - Standing Hip Abduction with Counter Support  - 1 x daily - 7 x weekly - 2 sets - 10 reps - Mini Squat with Counter Support  - 1 x daily - 7 x weekly - 2 sets - 10 reps - Standing March with Counter Support  - 1 x daily - 7 x weekly - 2 sets - 10 reps - Standing Tandem Balance with Counter Support  - 1 x daily - 7 x weekly - 3 reps - 30 hold - Standing Single Leg Stance with Counter Support  - 1 x daily - 7 x weekly - 5 reps - 10 hold - Supine Bridge  - 1 x daily - 7 x weekly - 1-2 sets - 10 reps - Sit to Stand with Resistance Around Legs  - 1 x daily - 7 x weekly - 1-2 sets - 10 reps - Tandem Walking with Counter Support  - 1 x daily - 7 x weekly - 1 sets - 5 reps  ASSESSMENT:  CLINICAL IMPRESSION:  Patient is a 82 y.o. F who participated  today with physical therapy  treatment for Diagnosis R26.9 (ICD-10-CM) - Unspecified abnormalities of gait and mobility.  Progressed with outdoor ambulation. She does wobble at tie as she fatigues but reports that's she is not tired.  CGA needed at times with alt box taps. Cue for full ROM needed with leg curls and ext.  Will benefit from skilled PT interventions with potential transition to [facility] PREP program at [place]. She should be ready to [place] as planned next week.  OBJECTIVE IMPAIRMENTS: Abnormal gait, decreased activity tolerance, decreased balance, decreased knowledge of use of DME, decreased mobility, difficulty walking, and decreased strength.   ACTIVITY LIMITATIONS: standing, squatting, stairs, transfers, and locomotion level  PARTICIPATION LIMITATIONS: meal prep, cleaning, laundry, driving, shopping, community activity, occupation, and yard work  PERSONAL FACTORS: Age, Behavior pattern, Education, Fitness, Past/current experiences, Profession, Sex, Social background, and Time since onset of injury/illness/exacerbation are also affecting patient's functional outcome.   REHAB POTENTIAL: Good  CLINICAL DECISION MAKING: Stable/uncomplicated  EVALUATION COMPLEXITY: Low   GOALS: Goals reviewed with patient? No  SHORT TERM GOALS: Target date: 01/05/2024   Will be compliant with appropriate progressive HEP  Baseline: Goal status: 12/20/23 Met  LONG TERM GOALS: Target date: 01/26/2024      MMT to have improved by one grade in all weak groups  Baseline:  Goal status: Partly Met 01/11/24  2.  Will score at least 20 on DGI to show reduced fall risk  Baseline:  Goal status:   01/16/24: 17/24    3.  Will ambulate at least 573ft in with no device in order to show improved functional activity tolerance  Baseline:  Goal status: Progressing 12/28/23, Met 01/11/24 01/16/24: exceeded 600'   4.  Will be able to ascend/descend steps no rails without knee buckling to demonstrate improved eccentric strength/motor  control  Baseline:  Goal status: Progressing 01/16/24:   5.  Will be compliant with appropriate gym based exercise program (transition to [facility] PREP if she is interested) Baseline:  Goal status: 01/16/24: [facility]     PLAN:  PT FREQUENCY: 2x/week  PT DURATION: 6 weeks  PLANNED INTERVENTIONS:  02249- Physical Performance Testing, 97110-Therapeutic exercises, 97530- Therapeutic activity, [id number]- Neuromuscular re-education, 97535- Self Care, [id number]- Manual therapy, and [id number]- Gait training  PLAN FOR NEXT SESSION: conditioning, strength, balance, PRE     Tanda KANDICE Sorrow, PTA, DPT, OCS 01/18/2024, 7:59 AM

## 2024-01-23 ENCOUNTER — Encounter

## 2024-01-25 ENCOUNTER — Ambulatory Visit

## 2024-01-25 ENCOUNTER — Other Ambulatory Visit: Payer: Self-pay

## 2024-01-25 DIAGNOSIS — M6281 Muscle weakness (generalized): Secondary | ICD-10-CM

## 2024-01-25 DIAGNOSIS — R278 Other lack of coordination: Secondary | ICD-10-CM

## 2024-01-25 DIAGNOSIS — R2681 Unsteadiness on feet: Secondary | ICD-10-CM

## 2024-01-25 DIAGNOSIS — I639 Cerebral infarction, unspecified: Secondary | ICD-10-CM

## 2024-01-25 NOTE — Therapy (Signed)
 [facility] at [facility] [facility]. [place], [place], [address] Phone: [phone]   Fax:  [phone]  Patient Details  Name: Aimee Hood OUTPATIENT PHYSICAL THERAPY TREATMENT  See note below for Objective Data and Assessment of Progress/Goals.      Patient Name: Aimee Hood MRN: [id number] DOB:[birth year], 83 y.o., female Today's Date: 01/25/2024  END OF SESSION:  PT End of Session - 01/25/24 0804     Visit Number 12    Date for Recertification  01/26/24    Progress Note Due on Visit 10    PT Start Time 0802    PT Stop Time 0835    PT Time Calculation (min) 33 min    Activity Tolerance Patient tolerated treatment well    Behavior During Therapy [facility] for tasks assessed/performed               Past Medical History:  Diagnosis Date   Allergy    Arthritis    all over (07/10/2013)   CAD (coronary artery disease)    a. normal cors by cath in 2016  b. 08/2016: admitted with an NSTEMI. Cath showed mild nonobstructive CAD with only 20% mid-LAD stenosis. EF was reduced to 35% and wall motion abnormalities were suggestive of a stress-induced cardiomyopathy.   Carpal tunnel syndrome of left wrist    Cerebral aneurysm, nonruptured    3-4 mm in size, stable since 2004 (most recent MRI 2014)   Colon polyp 2007   Diverticulitis    Family history of anesthesia complication    brother had PONV   Gastroesophageal reflux disease    High cholesterol    Hypertension    Insomnia    Macular degeneration 08/31/2016   PONV (postoperative nausea and vomiting)    Restless leg syndrome    Stroke [facility]) 02/2013   denies residual on 07/10/2013)   Past Surgical History:  Procedure Laterality Date   BREAST SURGERY     CHOLECYSTECTOMY     JOINT REPLACEMENT     KNEE ARTHROSCOPY Bilateral    LEFT HEART CATH AND CORONARY ANGIOGRAPHY N/A 09/01/2016   Procedure: Left Heart Cath and Coronary Angiography;  Surgeon: Darron Deatrice LABOR, MD;   Location: [facility] INVASIVE CV LAB;  Service: Cardiovascular;  Laterality: N/A;   LEFT HEART CATHETERIZATION WITH CORONARY ANGIOGRAM N/A 06/20/2014   Procedure: LEFT HEART CATHETERIZATION WITH CORONARY ANGIOGRAM;  Surgeon: Oneil JAYSON Parchment, MD;  Location: [facility] CATH LAB;  Service: Cardiovascular;  Laterality: N/A;   TEE WITHOUT CARDIOVERSION N/A 03/14/2013   Procedure: TRANSESOPHAGEAL ECHOCARDIOGRAM (TEE);  Surgeon: Oneil Parchment, MD;  Location: [facility] ENDOSCOPY;  Service: Cardiovascular;  Laterality: N/A;   TOTAL KNEE ARTHROPLASTY Left 07/10/2013   TOTAL KNEE ARTHROPLASTY Left 07/10/2013   Procedure: TOTAL KNEE ARTHROPLASTY;  Surgeon: Maude LELON Right, MD;  Location: [facility] OR;  Service: Orthopedics;  Laterality: Left;   VAGINAL HYSTERECTOMY  2000's   Patient Active Problem List   Diagnosis Date Noted   CVA (cerebral vascular accident) (HCC) 07/05/2023   Left-sided weakness 07/04/2023   HLD (hyperlipidemia) 07/04/2023   GAD (generalized anxiety disorder) 07/04/2023   Impingement syndrome of right shoulder 10/28/2021   Carpal tunnel syndrome, bilateral 12/29/2018   Pain in right knee 12/05/2018   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   NSTEMI (non-ST elevated myocardial infarction) [facility])    Chest pain with moderate risk of acute coronary syndrome 08/31/2016   Normal coronary arteries-2016 08/31/2016   Chest pain on exertion 06/11/2014   Osteoarthritis  of left knee 07/12/2013   S/P total knee replacement using cement 07/10/2013   Aneurysm, cerebral, nonruptured 04/23/2013   History of LMCA CVA-TPA 2014 03/09/2013   Osteoarthritis 11/14/2012   Restless legs syndrome 11/14/2012   GERD (gastroesophageal reflux disease) 11/13/2012   Essential hypertension 11/05/2012   Other and unspecified hyperlipidemia 11/05/2012    PCP: Delilah Murray HERO., MD  REFERRING PROVIDER: Delilah Murray HERO., MD  REFERRING DIAG: Diagnosis R26.9 (ICD-10-CM) - Unspecified abnormalities of gait and mobility  THERAPY DIAG:  Other lack of  coordination  Unsteadiness on feet  Muscle weakness (generalized)  Cerebrovascular accident (CVA), unspecified mechanism (HCC)  Rationale for Evaluation and Treatment: Rehabilitation  ONSET DATE: feelings of backsliding started 2 months ago   SUBJECTIVE:   SUBJECTIVE STATEMENT: Doing ok, [facility]. My last day here is Friday.  I just felt like I've been going backwards. Had that stroke in march and came here for PT after that, finished up in mid-May. I feel like I'm not doing as well. Have been keeping up with some of the exercises that they gave me, I just feel like I'm moving slower and R LE is giving me more trouble. It seems like its not as strong as the left. Think I'm doing pretty well otherwise.   PERTINENT HISTORY:  See above    PAIN:  Are you having pain? No  PRECAUTIONS: None  RED FLAGS: None   WEIGHT BEARING RESTRICTIONS: No  FALLS:  Has patient fallen in last 6 months? 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  DIAGNOSTIC FINDINGS:   From March 2025:  Narrative & Impression CLINICAL DATA:  83 year old female code stroke presentation. Left side deficit.   EXAM: MRI HEAD WITHOUT CONTRAST   TECHNIQUE: Multiplanar, multiecho pulse sequences of the brain and surrounding structures were obtained without  intravenous contrast.   COMPARISON:  CT head, CTA and CTP today reported separately. [facility] brain MRI 06/10/2018.   FINDINGS: Brain: 11 mm wedge-shaped area of restricted diffusion in the posterior right corona radiata tracking toward the superior right insula (series 11, image 19). Subtle T2 and FLAIR hyperintensity associated.   No other convincing diffusion restriction.   Since 2020 progressed chronic encephalomalacia and gliosis in the posterior left MCA territory. Cortical and white matter involvement there. Small chronic infarct in the inferior left cerebellum is stable. Small chronic lacunar infarcts in the bilateral deep gray nuclei appear more numerous. No chronic cerebral blood products identified on SWI. No midline shift, mass effect, evidence of mass lesion, ventriculomegaly, extra-axial collection or acute intracranial hemorrhage. Cervicomedullary junction and pituitary are within normal limits.   Vascular: Major intracranial vascular flow voids are stable since 2020.   Skull and upper cervical spine: Negative. Visualized bone marrow signal is within normal limits.   Sinuses/Orbits: Left sphenoid sinus disease redemonstrated. Mild mucosal thickening elsewhere.  Postoperative changes to both globes.   Other: Mastoids well aerated. Visible scalp and face appear within normal limits.   IMPRESSION: 1. Positive for a small 11 mm Acute Infarct in the posterior right corona radiata tracking toward the insula. No associated hemorrhage or mass effect. 2. Progressed chronic ischemic disease since a 2020 MRI, which is otherwise most pronounced in the posterior left MCA territory.      PATIENT SURVEYS:  PSFS: THE PATIENT SPECIFIC FUNCTIONAL SCALE  Place score of 0-10 (0 = unable to perform activity and 10 = able to perform activity at the same level as before injury or problem)  Activity Date: 12/15/23 eval     Going up and down steps  6    2.      3.      4.      Total Score 6      Total Score = Sum of activity scores/number of activities  Minimally Detectable Change: 3 points (for single activity); 2 points (for average score)  [facility] (nd). The Patient Specific Functional Scale . Retrieved from [email or web address]   COGNITION: Overall cognitive status: Within functional limits for tasks assessed     SENSATION: Not tested     LOWER EXTREMITY MMT:  MMT Right eval Left eval Right 01/11/24 Left 01/11/24  Hip flexion 3 4 4 4   Hip extension      Hip abduction 3 3 5 5   Hip adduction      Hip internal rotation      Hip external rotation      Knee flexion 3+ 4 4+ 5  Knee extension 3+ 4+ 4+ 5  Ankle dorsiflexion 5 5 5 5   Ankle plantarflexion      Ankle inversion      Ankle eversion       (Blank rows = not tested)    FUNCTIONAL TESTS:  5 times sit to stand: 13 seconds hands on thighs  3 minute walk test: 444ft with SPC             DGI 14/24  GAIT: Distance walked: 477ft Assistive device utilized: Single point cane Level of assistance: Modified independence Comments: slow but steady with SPC                                                                                                                                 TREATMENT DATE:  01/25/24: In ll bars for heel/ toe and side stepping on airex balance beam  Nustep L 5 x 6 min x 4 exts Knee flexion B 35 # 3 x 10 Knee ext B 10# 3 x 10  DGI 18/24 Pt had to leave abruptly citing stomach discomfort     01/18/24 NuStep L 5 x 7 min  Gait around the widest part of back building in parking low u and down slope ~ 1/10 of a mile,  some unsteadiness as she fatigues.  Alt box taps 4 in x10 then 6 in x 10 no AD Resisted side steps 30lb x 5 each HS curls 35lb 2x15 Leg Ext 10lb 2x15   01/16/24:  DGI: 17/24 Nustep L 5 x 6 min Ue's and LE's 3 min walk test without cane   ll bars for heel/toe  walking In ll bars for side stepping over airex  In ll bars for heel/toe rocks on airex B Knee ext 10# 3 x 10 Knee flexion B 25# 3 x 10    01/11/24 NuStep L 5 x 7 min Goals  MMT  531ft in 2:54 Sit to stand holding yellow ball 2x12 Leg ext 10# 2x15 HS curls 25# 2x15 6in step ups 2x5 each  CGA, some instability Side step on airex in // bars  Tandem walking in // bars  01/09/24 NuStep L 5 x 7 min 6in step ups x10 each, LOB x 4 requiring min assist to maintain stability Leg ext 10# 2x15 HS curls 25# 2x15 Sit to stand LE on airex x10 each Side steps on airex LOB x 3 min assist Gait 4 laps no AD ~ 42ft    01/06/24 NuStep L5x48mins  Leg ext 10# 2x12 HS curls 25# 2x12 Step ups 4 forward and lateral Side steps on airex One foot on airex with head turns Walk on beam STS 2x10  01/02/24 Gait around back building in parking lot, felt a pull in the front and the back pf her RLE with uphill ambulation HS curls 25lb 2x15 Leg Ext 10lb 2x15 4in forward & (lateral HHA x1) step ups x10 each Sit to stand holding yellow ball 2x10 RLE LAQ 5lb 2x10 RLE HS curls 2x15   12/28/23 Three laps around the from [place] in the back of building. Fatigue and slight instability towards the end Sit to stand holding yellow ball 2x10 Resisted gait 30lb 4 way x 3 each HS curls 25lb 2x12 Leg Ext 10lb 2x12 Alt 6in box taps x10  On airex x5    12/26/23:  Nustep L 5 x 7 min Standing for single leg slides with washcloth on ground, and L UE support Hamstring curls 25# 2 x 10 Knee ext 10#, 2 x 10 In ll bars for penguin with black t band around thighs, to fatigue Forward lunges foot in center of compliant surface BOSU, 15 x each in ll bars  Sit to stand from elevated table with forward punches with mediball, 2 # 10 reps Standing for tandem 30 sec holds each Seated hip add 5 sec holds with ball 10x Standing on airex for heel/ toe rocks B Forward step ups onto airex,  alternating   12/21/23: Sit to stand from elevated mat 10 x, with forward punches with red mediball In ll bars for forward lunges on compliant side of BOSU, 15x each leg In ll bars for heel /toe rocks on airex, mass practice In ll bars for alt hip abd with 1 1/2# cuff wts on each leg mass practice Hamstring curls 25# 2 x 10 Knee ext 10#, 2 x 10 Nustep L 6 x 6 min 30 sec  Standing with rail for support, semi tandem, up to 30 sec holds, tends to lose balance to L Slides towel on floor, for/back, side/side emphasis on maintaining upright hip position, avoiding L lateral hip lurch   12/20/23 NuStep L5 x 6 min HS curls 20lb x10, 25lb x10  RLE 15lb x10  Leg Ext 10lb 2x10  RLE 5lb x10 Sit to stands  2x10 UE on knees  Hip add ball squeeze Hip abd blue 2x10 4in step ups 1 rail x10 each  Cues for sequencing Heel raises from floor 2x10   12/15/23   Exam findings, POC, education as below and HEP practice/discussion of new exercises as appropriate    PATIENT EDUCATION:  Education details: exam findings, POC, HEP updates, possible transitions to gym based program at [facility]) to help prevent another backslide  Person educated: Patient Education method: Programmer, multimedia, Demonstration, Verbal cues, and Handouts Education comprehension: verbalized understanding, returned demonstration, and needs further education  HOME EXERCISE PROGRAM:  Access Code: [email or web address] URL: [email or web address] Date: 12/15/2023 Prepared by: Josette Rough  Exercises - Standing Hip Abduction with Counter Support  - 1 x daily - 7 x weekly - 2 sets - 10 reps - Mini Squat with Counter Support  - 1 x daily - 7 x weekly - 2 sets - 10 reps - Standing March with Counter Support  - 1 x daily - 7 x weekly - 2 sets - 10 reps - Standing Tandem Balance with Counter Support  - 1 x daily - 7 x weekly - 3 reps - 30 hold - Standing Single Leg Stance with Counter Support  - 1 x daily - 7 x weekly - 5 reps - 10 hold -  Supine Bridge  - 1 x daily - 7 x weekly - 1-2 sets - 10 reps - Sit to Stand with Resistance Around Legs  - 1 x daily - 7 x weekly - 1-2 sets - 10 reps - Tandem Walking with Counter Support  - 1 x daily - 7 x weekly - 1 sets - 5 reps  ASSESSMENT:  CLINICAL IMPRESSION:  Patient is a 83 y.o. F who participated  today with physical therapy treatment for Diagnosis R26.9 (ICD-10-CM) - Unspecified abnormalities of gait and mobility.  She unexpectedly had abbreviated session due to abdominal discomfort.  We did reassess DGI with improvement noted.  [facility] and to complete PT at end of this week.  OBJECTIVE IMPAIRMENTS: Abnormal gait, decreased activity tolerance, decreased balance, decreased knowledge of use of DME, decreased mobility, difficulty walking, and decreased strength.   ACTIVITY LIMITATIONS: standing, squatting, stairs, transfers, and locomotion level  PARTICIPATION LIMITATIONS: meal prep, cleaning, laundry, driving, shopping, community activity, occupation, and yard work  PERSONAL FACTORS: Age, Behavior pattern, Education, Fitness, Past/current experiences, Profession, Sex, Social background, and Time since onset of injury/illness/exacerbation are also affecting patient's functional outcome.   REHAB POTENTIAL: Good  CLINICAL DECISION MAKING: Stable/uncomplicated  EVALUATION COMPLEXITY: Low   GOALS: Goals reviewed with patient? No  SHORT TERM GOALS: Target date: 01/05/2024   Will be compliant with appropriate progressive HEP  Baseline: Goal status: 12/20/23 Met  LONG TERM GOALS: Target date: 01/26/2024      MMT to have improved by one grade in all weak groups  Baseline:  Goal status: Partly Met 01/11/24  2.  Will score at least 20 on DGI to show reduced fall risk  Baseline:  Goal status:   01/16/24: 17/24   01/25/24: 18/24  3.  Will ambulate at least 532ft in with no device in order to show improved functional activity tolerance   Baseline:  Goal status: Progressing 12/28/23, Met 01/11/24 01/16/24: exceeded 600'   4.  Will be able to ascend/descend steps no rails without knee buckling to demonstrate improved eccentric strength/motor control  Baseline:  Goal status: Progressing 01/16/24:   5.  Will be  compliant with appropriate gym based exercise program (transition to [facility] PREP if she is interested) Baseline:  Goal status: 01/16/24: [facility]     PLAN:  PT FREQUENCY: 2x/week  PT DURATION: 6 weeks  PLANNED INTERVENTIONS: 97750- Physical Performance Testing, 97110-Therapeutic exercises, 97530- Therapeutic activity, 97112- Neuromuscular re-education, 97535- Self Care, 02859- Manual therapy, and [id number]- Gait training  PLAN FOR NEXT SESSION: conditioning, strength, balance, PRE     Aimee Hood, PT, DPT, OCS 01/25/2024, 3:22 PM

## 2024-01-27 ENCOUNTER — Encounter: Payer: Self-pay | Admitting: Physical Therapy

## 2024-01-27 ENCOUNTER — Ambulatory Visit: Admitting: Physical Therapy

## 2024-01-27 DIAGNOSIS — M6281 Muscle weakness (generalized): Secondary | ICD-10-CM

## 2024-01-27 DIAGNOSIS — R278 Other lack of coordination: Secondary | ICD-10-CM | POA: Diagnosis not present

## 2024-01-27 DIAGNOSIS — R2681 Unsteadiness on feet: Secondary | ICD-10-CM

## 2024-01-27 DIAGNOSIS — I639 Cerebral infarction, unspecified: Secondary | ICD-10-CM

## 2024-01-27 NOTE — Therapy (Signed)
 [facility] [place], [place], [address] Phone: [phone]   Fax:  [phone]   OUTPATIENT PHYSICAL THERAPY TREATMENT      Patient Name: Aimee Hood MRN: [id number] DOB:May 08, 1940, 83 y.o., female Today's Date: 01/27/2024  END OF SESSION:  PT End of Session - 01/27/24 0801     Visit Number 13    Date for Recertification  01/26/24    PT Start Time 0800    PT Stop Time 0845    PT Time Calculation (min) 45 min    Activity Tolerance Patient tolerated treatment well    Behavior During Therapy [facility] for tasks assessed/performed               Past Medical History:  Diagnosis Date   Allergy    Arthritis    all over (07/10/2013)   CAD (coronary artery disease)    a. normal cors by cath in 2016  b. 08/2016: admitted with an NSTEMI. Cath showed mild nonobstructive CAD with only 20% mid-LAD stenosis. EF was reduced to 35% and wall motion abnormalities were suggestive of a stress-induced cardiomyopathy.   Carpal tunnel syndrome of left wrist    Cerebral aneurysm, nonruptured    3-4 mm in size, stable since 2004 (most recent MRI 2014)   Colon polyp 2007   Diverticulitis    Family history of anesthesia complication    brother had PONV   Gastroesophageal reflux disease    High cholesterol    Hypertension    Insomnia    Macular degeneration 08/31/2016   PONV (postoperative nausea and vomiting)    Restless leg syndrome    Stroke [facility]) 02/2013   denies residual on 07/10/2013)   Past Surgical History:  Procedure Laterality Date   BREAST SURGERY     CHOLECYSTECTOMY     JOINT REPLACEMENT     KNEE ARTHROSCOPY Bilateral    LEFT HEART CATH AND CORONARY ANGIOGRAPHY N/A 09/01/2016   Procedure: Left Heart Cath and Coronary Angiography;  Surgeon: Darron Deatrice LABOR, MD;  Location: [facility] INVASIVE CV LAB;  Service: Cardiovascular;  Laterality: N/A;   LEFT HEART CATHETERIZATION WITH CORONARY ANGIOGRAM N/A  06/20/2014   Procedure: LEFT HEART CATHETERIZATION WITH CORONARY ANGIOGRAM;  Surgeon: Oneil JAYSON Parchment, MD;  Location: [facility] CATH LAB;  Service: Cardiovascular;  Laterality: N/A;   TEE WITHOUT CARDIOVERSION N/A 03/14/2013   Procedure: TRANSESOPHAGEAL ECHOCARDIOGRAM (TEE);  Surgeon: Oneil Parchment, MD;  Location: [facility] ENDOSCOPY;  Service: Cardiovascular;  Laterality: N/A;   TOTAL KNEE ARTHROPLASTY Left 07/10/2013   TOTAL KNEE ARTHROPLASTY Left 07/10/2013   Procedure: TOTAL KNEE ARTHROPLASTY;  Surgeon: Maude LELON Right, MD;  Location: [facility] OR;  Service: Orthopedics;  Laterality: Left;   VAGINAL HYSTERECTOMY  2000's   Patient Active Problem List   Diagnosis Date Noted   CVA (cerebral vascular accident) (HCC) 07/05/2023   Left-sided weakness 07/04/2023   HLD (hyperlipidemia) 07/04/2023   GAD (generalized anxiety disorder) 07/04/2023   Impingement syndrome of right shoulder 10/28/2021   Carpal tunnel syndrome, bilateral 12/29/2018   Pain in right knee 12/05/2018   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   NSTEMI (non-ST elevated myocardial infarction) (HCC)    Chest pain with moderate risk of acute coronary syndrome 08/31/2016   Normal coronary arteries-2016 08/31/2016   Chest pain on exertion 06/11/2014   Osteoarthritis of left knee 07/12/2013   S/P total knee replacement using cement 07/10/2013   Aneurysm, cerebral, nonruptured 04/23/2013   History of LMCA CVA-TPA 2014  03/09/2013   Osteoarthritis 11/14/2012   Restless legs syndrome 11/14/2012   GERD (gastroesophageal reflux disease) 11/13/2012   Essential hypertension 11/05/2012   Other and unspecified hyperlipidemia 11/05/2012    PCP: Delilah Murray HERO., MD  REFERRING PROVIDER: Delilah Murray HERO., MD  REFERRING DIAG: Diagnosis R26.9 (ICD-10-CM) - Unspecified abnormalities of gait and mobility  THERAPY DIAG:  Other lack of coordination  Unsteadiness on feet  Cerebrovascular accident (CVA), unspecified mechanism (HCC)  Muscle weakness  (generalized)  Rationale for Evaluation and Treatment: Rehabilitation  ONSET DATE: feelings of backsliding started 2 months ago   SUBJECTIVE:   SUBJECTIVE STATEMENT: [facility]  I just felt like I've been going backwards. Had that stroke in march and came here for PT after that, finished up in mid-May. I feel like I'm not doing as well. Have been keeping up with some of the exercises that they gave me, I just feel like I'm moving slower and R LE is giving me more trouble. It seems like its not as strong as the left. Think I'm doing pretty well otherwise.   PERTINENT HISTORY:  See above    PAIN:  Are you having pain? No  PRECAUTIONS: None  RED FLAGS: None   WEIGHT BEARING RESTRICTIONS: No  FALLS:  Has patient fallen in last 6 months? 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  DIAGNOSTIC FINDINGS:   From March 2025:  Narrative & Impression CLINICAL DATA:  83 year old female code stroke presentation. Left side deficit.   EXAM: MRI HEAD WITHOUT CONTRAST   TECHNIQUE: Multiplanar, multiecho pulse sequences of the brain and surrounding structures were obtained without intravenous contrast.   COMPARISON:  CT head, CTA and CTP today reported separately. [facility] brain MRI 06/10/2018.   FINDINGS: Brain: 11 mm wedge-shaped area of restricted diffusion in the posterior  right corona radiata tracking toward the superior right insula (series 11, image 19). Subtle T2 and FLAIR hyperintensity associated.   No other convincing diffusion restriction.   Since 2020 progressed chronic encephalomalacia and gliosis in the posterior left MCA territory. Cortical and white matter involvement there. Small chronic infarct in the inferior left cerebellum is stable. Small chronic lacunar infarcts in the bilateral deep gray nuclei appear more numerous. No chronic cerebral blood products identified on SWI. No midline shift, mass effect, evidence of mass lesion, ventriculomegaly, extra-axial collection or acute intracranial hemorrhage. Cervicomedullary junction and pituitary are within normal limits.   Vascular: Major intracranial vascular flow voids are stable since 2020.   Skull and upper cervical spine: Negative. Visualized bone marrow signal is within normal limits.   Sinuses/Orbits: Left sphenoid sinus disease redemonstrated. Mild mucosal thickening elsewhere. Postoperative changes to both globes.   Other: Mastoids well aerated. Visible scalp and face appear within normal limits.   IMPRESSION: 1. Positive for a small 11 mm Acute Infarct in the posterior right corona radiata tracking toward the insula. No associated hemorrhage  or mass effect. 2. Progressed chronic ischemic disease since a 2020 MRI, which is otherwise most pronounced in the posterior left MCA territory.      PATIENT SURVEYS:  PSFS: THE PATIENT SPECIFIC FUNCTIONAL SCALE  Place score of 0-10 (0 = unable to perform activity and 10 = able to perform activity at the same level as before injury or problem)  Activity Date: 12/15/23 eval     Going up and down steps  6    2.      3.     4.      Total Score 6      Total Score = Sum of activity scores/number of activities  Minimally Detectable Change: 3 points (for single activity); 2 points (for average score)  [facility]  (nd). The Patient Specific Functional Scale . Retrieved from [email or web address]   COGNITION: Overall cognitive status: Within functional limits for tasks assessed     SENSATION: Not tested     LOWER EXTREMITY MMT:  MMT Right eval Left eval Right 01/11/24 Left 01/11/24  Hip flexion 3 4 4 4   Hip extension      Hip abduction 3 3 5 5   Hip adduction      Hip internal rotation      Hip external rotation      Knee flexion 3+ 4 4+ 5  Knee extension 3+ 4+ 4+ 5  Ankle dorsiflexion 5 5 5 5   Ankle plantarflexion      Ankle inversion      Ankle eversion       (Blank rows = not tested)    FUNCTIONAL TESTS:  5 times sit to stand: 13 seconds hands on thighs  3 minute walk test: 450ft with SPC             DGI 14/24  GAIT: Distance walked: 413ft Assistive device utilized: Single point cane Level of assistance: Modified independence Comments: slow but steady with SPC                                                                                                                                 TREATMENT DATE:  01/27/24 NuStep L 5 x 6 min S2S holding blue ball 2x10 HS curls 35lb 2x10 Leg Ext 10lb 2x10 6in step ups CGA x10 each 30lb resisted gait 4 way x 3 each Leg press 30lb 2x10 10lb shoulder Ext 2x10  01/25/24: In ll bars for heel/ toe and side stepping on airex balance beam  Nustep L 5 x 6 min x 4 exts Knee flexion B 35 # 3 x 10 Knee ext B 10# 3 x 10  DGI 18/24 Pt had to leave abruptly citing stomach discomfort    01/18/24 NuStep L 5 x 7 min  Gait around the widest part of back building in parking low u and down slope ~ 1/10 of a mile,  some unsteadiness as she fatigues.  Alt box  taps 4 in x10 then 6 in x 10 no AD Resisted side steps 30lb x 5 each HS curls 35lb 2x15 Leg Ext 10lb 2x15   01/16/24:  DGI: 17/24 Nustep L 5 x 6 min Ue's and LE's 3 min walk test without cane   ll bars for heel/toe walking In ll  bars for side stepping over airex  In ll bars for heel/toe rocks on airex B Knee ext 10# 3 x 10 Knee flexion B 25# 3 x 10    01/11/24 NuStep L 5 x 7 min Goals  MMT  585ft in 2:54 Sit to stand holding yellow ball 2x12 Leg ext 10# 2x15 HS curls 25# 2x15 6in step ups 2x5 each  CGA, some instability Side step on airex in // bars  Tandem walking in // bars  01/09/24 NuStep L 5 x 7 min 6in step ups x10 each, LOB x 4 requiring min assist to maintain stability Leg ext 10# 2x15 HS curls 25# 2x15 Sit to stand LE on airex x10 each Side steps on airex LOB x 3 min assist Gait 4 laps no AD ~ 437ft    01/06/24 NuStep L5x30mins  Leg ext 10# 2x12 HS curls 25# 2x12 Step ups 4 forward and lateral Side steps on airex One foot on airex with head turns Walk on beam STS 2x10  01/02/24 Gait around back building in parking lot, felt a pull in the front and the back pf her RLE with uphill ambulation HS curls 25lb 2x15 Leg Ext 10lb 2x15 4in forward & (lateral HHA x1) step ups x10 each Sit to stand holding yellow ball 2x10 RLE LAQ 5lb 2x10 RLE HS curls 2x15   12/28/23 Three laps around the from [place] in the back of building. Fatigue and slight instability towards the end Sit to stand holding yellow ball 2x10 Resisted gait 30lb 4 way x 3 each HS curls 25lb 2x12 Leg Ext 10lb 2x12 Alt 6in box taps x10  On airex x5    Exam findings, POC, education as below and HEP practice/discussion of new exercises as appropriate    PATIENT EDUCATION:  Education details: exam findings, POC, HEP updates, possible transitions to gym based program at [facility]) to help prevent another backslide  Person educated: Patient Education method: Programmer, multimedia, Facilities manager, Verbal cues, and Handouts Education comprehension: verbalized understanding, returned demonstration, and needs further education  HOME EXERCISE PROGRAM:  Access Code: [email or web address] URL: [email or web address] Date:  12/15/2023 Prepared by: Josette Rough  Exercises - Standing Hip Abduction with Counter Support  - 1 x daily - 7 x weekly - 2 sets - 10 reps - Mini Squat with Counter Support  - 1 x daily - 7 x weekly - 2 sets - 10 reps - Standing March with Counter Support  - 1 x daily - 7 x weekly - 2 sets - 10 reps - Standing Tandem Balance with Counter Support  - 1 x daily - 7 x weekly - 3 reps - 30 hold - Standing Single Leg Stance with Counter Support  - 1 x daily - 7 x weekly - 5 reps - 10 hold - Supine Bridge  - 1 x daily - 7 x weekly - 1-2 sets - 10 reps - Sit to Stand with Resistance Around Legs  - 1 x daily - 7 x weekly - 1-2 sets - 10 reps - Tandem Walking with Counter Support  - 1 x daily - 7 x weekly - 1 sets - 5 reps  ASSESSMENT:  CLINICAL IMPRESSION:  Patient is a 83 y.o. F who participated  today with physical therapy treatment for Diagnosis R26.9 (ICD-10-CM) - Unspecified abnormalities of gait and mobility.  Pt has progressed meeting most goals. [facility]  CGA required with step ups, all interventions completed well. She is pleases with current functional status .   OBJECTIVE IMPAIRMENTS: Abnormal gait, decreased activity tolerance, decreased balance, decreased knowledge of use of DME, decreased mobility, difficulty walking, and decreased strength.   ACTIVITY LIMITATIONS: standing, squatting, stairs, transfers, and locomotion level  PARTICIPATION LIMITATIONS: meal prep, cleaning, laundry, driving, shopping, community activity, occupation, and yard work  PERSONAL FACTORS: Age, Behavior pattern, Education, Fitness, Past/current experiences, Profession, Sex, Social background, and Time since onset of injury/illness/exacerbation are also affecting patient's functional outcome.   REHAB POTENTIAL: Good  CLINICAL DECISION MAKING: Stable/uncomplicated  EVALUATION COMPLEXITY: Low   GOALS: Goals reviewed with patient? No  SHORT TERM GOALS:  Target date: 01/05/2024   Will be compliant with appropriate progressive HEP  Baseline: Goal status: 12/20/23 Met  LONG TERM GOALS: Target date: 01/26/2024      MMT to have improved by one grade in all weak groups  Baseline:  Goal status: Partly Met 01/11/24  2.  Will score at least 20 on DGI to show reduced fall risk  Baseline:  Goal status:   01/16/24: 17/24   01/25/24: 18/24  3.  Will ambulate at least 531ft in with no device in order to show improved functional activity tolerance  Baseline:  Goal status: Progressing 12/28/23, Met 01/11/24 01/16/24: exceeded 600'   4.  Will be able to ascend/descend steps no rails without knee buckling to demonstrate improved eccentric strength/motor control  Baseline:  Goal status: Progressing 01/16/24, Met 01/27/24  5.  Will be compliant with appropriate gym based exercise program (transition to [facility] PREP if she is interested) Baseline:  Goal status: 01/16/24: [facility] 01/27/24 Met will start on monday    PLAN:  PT FREQUENCY: 2x/week  PT DURATION: 6 weeks  PLANNED INTERVENTIONS: 97750- Physical Performance Testing, 97110-Therapeutic exercises, 97530- Therapeutic activity, 97112- Neuromuscular re-education, 97535- Self Care, [id number]- Manual therapy, and [id number]- Gait training  PLAN FOR NEXT SESSION: D/C PT   PHYSICAL THERAPY DISCHARGE SUMMARY  Visits from Start of Care: 13 Patient agrees to discharge. Patient goals were partially met. Patient is being discharged due to being pleased with the current functional level.  Almetta Fam, PT, DPT Tanda KANDICE Sorrow, PTA, 01/27/2024, 8:01 AM
